# Patient Record
Sex: Female | Born: 1955
Health system: Southern US, Community
[De-identification: ages and names within clinical notes are randomized; demographics above are authoritative.]

## PROBLEM LIST (undated history)

## (undated) DIAGNOSIS — Z801 Family history of malignant neoplasm of trachea, bronchus and lung: Secondary | ICD-10-CM

## (undated) DIAGNOSIS — R112 Nausea with vomiting, unspecified: Secondary | ICD-10-CM

## (undated) DIAGNOSIS — M545 Low back pain, unspecified: Secondary | ICD-10-CM

## (undated) DIAGNOSIS — J189 Pneumonia, unspecified organism: Secondary | ICD-10-CM

## (undated) DIAGNOSIS — M5030 Other cervical disc degeneration, unspecified cervical region: Secondary | ICD-10-CM

## (undated) DIAGNOSIS — L570 Actinic keratosis: Secondary | ICD-10-CM

## (undated) DIAGNOSIS — R55 Syncope and collapse: Secondary | ICD-10-CM

## (undated) DIAGNOSIS — I10 Essential (primary) hypertension: Secondary | ICD-10-CM

## (undated) DIAGNOSIS — C50919 Malignant neoplasm of unspecified site of unspecified female breast: Secondary | ICD-10-CM

## (undated) DIAGNOSIS — Z803 Family history of malignant neoplasm of breast: Secondary | ICD-10-CM

## (undated) DIAGNOSIS — E785 Hyperlipidemia, unspecified: Secondary | ICD-10-CM

## (undated) DIAGNOSIS — Z9889 Other specified postprocedural states: Secondary | ICD-10-CM

## (undated) HISTORY — DX: Essential (primary) hypertension: I10

## (undated) HISTORY — DX: Family history of malignant neoplasm of breast: Z80.3

## (undated) HISTORY — PX: POLYPECTOMY: SHX149

## (undated) HISTORY — DX: Actinic keratosis: L57.0

## (undated) HISTORY — DX: Nausea with vomiting, unspecified: R11.2

## (undated) HISTORY — DX: Family history of malignant neoplasm of trachea, bronchus and lung: Z80.1

## (undated) HISTORY — PX: TUBAL LIGATION: SHX77

## (undated) HISTORY — DX: Hyperlipidemia, unspecified: E78.5

## (undated) HISTORY — DX: Other specified postprocedural states: Z98.890

## (undated) HISTORY — DX: Low back pain: M54.5

## (undated) HISTORY — PX: HAND SURGERY: SHX662

## (undated) HISTORY — DX: Other cervical disc degeneration, unspecified cervical region: M50.30

## (undated) HISTORY — PX: KNEE SURGERY: SHX244

## (undated) HISTORY — PX: OOPHORECTOMY: SHX86

## (undated) HISTORY — DX: Low back pain, unspecified: M54.50

## (undated) HISTORY — DX: Pneumonia, unspecified organism: J18.9

## (undated) HISTORY — PX: ABDOMINAL HYSTERECTOMY: SHX81

## (undated) HISTORY — DX: Syncope and collapse: R55

## (undated) HISTORY — PX: CARDIOVASCULAR STRESS TEST: SHX262

---
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
## 1996-07-29 DIAGNOSIS — J189 Pneumonia, unspecified organism: Secondary | ICD-10-CM

## 1996-07-29 HISTORY — DX: Pneumonia, unspecified organism: J18.9

## 2005-04-29 ENCOUNTER — Ambulatory Visit: Payer: Self-pay | Admitting: Ophthalmology

## 2006-02-27 ENCOUNTER — Ambulatory Visit: Payer: Self-pay | Admitting: Gastroenterology

## 2006-03-10 ENCOUNTER — Ambulatory Visit: Payer: Self-pay

## 2006-03-17 ENCOUNTER — Ambulatory Visit: Payer: Self-pay | Admitting: Gastroenterology

## 2007-07-16 DIAGNOSIS — R55 Syncope and collapse: Secondary | ICD-10-CM

## 2007-07-16 HISTORY — DX: Syncope and collapse: R55

## 2010-04-19 ENCOUNTER — Ambulatory Visit: Payer: Self-pay | Admitting: Internal Medicine

## 2010-06-12 ENCOUNTER — Ambulatory Visit: Payer: Self-pay

## 2010-06-29 ENCOUNTER — Encounter: Payer: Self-pay | Admitting: Family Medicine

## 2010-06-29 ENCOUNTER — Ambulatory Visit: Payer: Self-pay | Admitting: Family Medicine

## 2010-06-29 DIAGNOSIS — I10 Essential (primary) hypertension: Secondary | ICD-10-CM | POA: Insufficient documentation

## 2010-06-29 DIAGNOSIS — D72819 Decreased white blood cell count, unspecified: Secondary | ICD-10-CM | POA: Insufficient documentation

## 2010-06-29 DIAGNOSIS — E785 Hyperlipidemia, unspecified: Secondary | ICD-10-CM | POA: Insufficient documentation

## 2010-06-29 DIAGNOSIS — M5416 Radiculopathy, lumbar region: Secondary | ICD-10-CM | POA: Insufficient documentation

## 2010-07-12 ENCOUNTER — Ambulatory Visit: Payer: Self-pay | Admitting: Family Medicine

## 2010-07-12 ENCOUNTER — Encounter: Payer: Self-pay | Admitting: Family Medicine

## 2010-07-16 LAB — CONVERTED CEMR LAB
ALT: 18 units/L (ref 0–35)
AST: 18 units/L (ref 0–37)
Cholesterol: 239 mg/dL — ABNORMAL HIGH (ref 0–200)
Eosinophils Absolute: 0.1 10*3/uL (ref 0.0–0.7)
Eosinophils Relative: 2 % (ref 0–5)
HDL: 65 mg/dL (ref >39)
Lymphs Abs: 1.5 10*3/uL (ref 0.7–4.0)
MCHC: 32.9 g/dL (ref 30.0–36.0)
Monocytes Relative: 10 % (ref 3–12)
Neutrophils Relative %: 51 % (ref 43–77)
Platelets: 244 10*3/uL (ref 150–400)
WBC: 4.3 10*3/uL (ref 4.0–10.5)

## 2010-08-21 ENCOUNTER — Encounter: Payer: Self-pay | Admitting: Family Medicine

## 2010-08-28 NOTE — Assessment & Plan Note (Signed)
Summary: NEW PT TO EST/CLE  R/S FROM 07/03/10   Vital Signs:  Patient profile:   55 year old female Height:      64.5 inches Weight:      159.50 pounds BMI:     27.05 Temp:     98.3 degrees F oral Pulse rate:   80 / minute Pulse rhythm:   regular BP sitting:   156 / 84  (left arm) Cuff size:   regular  Vitals Entered By: Lewanda Rife LPN (June 29, 2010 3:24 PM) CC: New pt to establish   History of Present Illness: here to est as new pt   used to see Dr Beverely Risen in [place] she lives close by and this is closer   hyperlipidemia- was on lovastatin  had some leg pain with it --off since sept and leg pain has not gone away  chol is high - probably needs to go back on it   still having leg pain   HTN -- has had that for over 30 years  on amlodipne  that usually controls her - avg 130/80  is worried right now and that may be why her bp is high today    asthma - is fairly mild  uses albuterol when she runs -- tends to have cough variant asthma  does get flu shots yearly -- was in oct  2 pneumonia shots since 1998 -- had pneumonia   menopausal symptoms  had hyst in 2005  has been on hormones ever since  does not see gyn  has not disc time to wean off estrogen   has borderline low wbc ct -- last was 3.8 (had been 4.5 several months before )   has had leg pain since early march  slipped on [place]wood with her dog  started in lateral hip L and now moved to her buttock  then one day she bent down and felt like her calf muscles were swollen and tight  briefly in both legs  now is back to the L  then took off chol better  did x rays-- of hip and knee on L  and also venous doppler both legs  which were neg  never had x ray of LS  given pain pill   has known deg disk dz in neck   if she sits for long time - has a lot of pain at bottom of L buttock  cannot squat or sit cross legged  has intermittently had pain in that leg and calf  no numbness or weakness in leg    has been walking a little with her friend -- that feels ok until she starts moving  not running - afraid to because initially it made her worse  took meloxicam -- did not help  then given px for tramadol - has not filled yet  heat does feel good    worried she has gained 20 lb in 2 years   took appetite supressant - helped temporarily       Allergies (verified): 1)  ! Codeine  Past History:  Past Medical History: hyperlipidemia  asthma HTN  deg disk dz in neck   Past Surgical History: hyst supracervical -- L ovary remains  colonosc 07 1998 atypical pneumonia - not hosp  Family History: father deceased lung cancer mother living high chol, HTN, DM uncle lung cancer  MGF CAD aunt with DM  no breast cancer  no breast cancer  some pancreatic cancer in family  Social History: G2P2 married kids live close by  3 grandkids -- really enjoys  works for ins [employer]-- [employer]  exercise -- running when she can  ran a 5 K in march  2011  Review of Systems General:  Denies chills, fatigue, and fever. Eyes:  Denies blurring and eye irritation. CV:  Denies chest pain or discomfort, lightheadness, and palpitations. Resp:  Denies cough, shortness of breath, and wheezing. GI:  Denies abdominal pain, change in bowel habits, indigestion, and nausea. GU:  Denies dysuria and urinary frequency. MS:  Complains of low back pain, muscle aches, and stiffness; denies joint pain, joint redness, joint swelling, cramps, and muscle weakness. Derm:  Denies itching, lesion(s), poor wound healing, and rash. Neuro:  Denies numbness and tingling. Psych:  Denies anxiety and depression. Endo:  Denies cold intolerance, excessive thirst, excessive urination, and heat intolerance. Heme:  Denies abnormal bruising and bleeding.  Physical Exam  General:  Well-developed,well-nourished,in no acute distress; alert,appropriate and cooperative throughout examination Head:  normocephalic,  atraumatic, and no abnormalities observed.   Eyes:  vision grossly intact, pupils equal, pupils round, and pupils reactive to light.  no conjunctival pallor, injection or icterus  Nose:  no nasal discharge.   Mouth:  pharynx pink and moist.   Neck:  supple with full rom and no masses or thyromegally, no JVD or carotid bruit  Chest Wall:  No deformities, masses, or tenderness noted. Lungs:  Normal respiratory effort, chest expands symmetrically. Lungs are clear to auscultation, no crackles or wheezes. Heart:  Normal rate and regular rhythm. S1 and S2 normal without gallop, murmur, click, rub or other extra sounds. Abdomen:  Bowel sounds positive,abdomen soft and non-tender without masses, organomegaly or hernias noted. no renal bruits  Msk:  tender over SI joint and buttock on L  no spinal tenderness nl rom LS  nl rom hips and rotation   Pulses:  R and L carotid,radial,femoral,dorsalis pedis and posterior tibial pulses are full and equal bilaterally Extremities:  No clubbing, cyanosis, edema, or deformity noted with normal full range of motion of all joints.   Neurologic:  strength normal in all extremities, sensation intact to light touch, gait normal, and DTRs symmetrical and normal.   Skin:  Intact without suspicious lesions or rashes Cervical Nodes:  No lymphadenopathy noted Inguinal Nodes:  No significant adenopathy Psych:  pt is somewhat anxious but pleasant and talkative   Impression & Recommendations:  Problem # 1:  BACK PAIN, LUMBAR (ICD-724.2) Assessment New ongoing since injury in march  exam consistent with muscle spasm  trial of flexeril LS films today perhaps consider PT or sport med consult if not clear  Her updated medication list for this problem includes:    Aspirin 81 Mg Tabs (Aspirin) .Marland Kitchen... Take 1 tablet by mouth once a day    Flexeril 10 Mg Tabs (Cyclobenzaprine hcl) .Marland Kitchen... 1 by mouth up to three times a day as needed back/ hip pain  warn- may  sedate  Orders: T-Lumbar Spine 2 Views (72100TC)  Problem # 2:  HYPERTENSION, BENIGN ESSENTIAL (ICD-401.1) Assessment: New  bp is up but per pt is nl on well calibrated cuff at home asked her to bring cuff to next visit and we will continue to monitor  Her updated medication list for this problem includes:    Norvasc 2.5 Mg Tabs (Amlodipine besylate) .Marland Kitchen... 1 by mouth once daily  BP today: 156/84  Orders: Prescription Created Electronically [phone])  Problem # 3:  HYPERLIPIDEMIA (ICD-272.4) Assessment:  New  asked pt to start back on lovastatin and update if any side eff  re check lab 2 wk  Her updated medication list for this problem includes:    Lovastatin 40 Mg Tabs (Lovastatin) .Marland Kitchen... 1 by mouth once daily  Orders: Prescription Created Electronically [phone])  Problem # 4:  LEUKOCYTOPENIA UNSPECIFIED (ICD-288.50) Assessment: New per pt wbc 3.8 last time  she is very worried about this - though re- assured by last doctor  will re check at 2 wk labs no symptoms   Complete Medication List: 1)  Lovastatin 40 Mg Tabs (Lovastatin) .Marland Kitchen.. 1 by mouth once daily 2)  Calcium Carbonate-vitamin D 600-400 Mg-unit Tabs (Calcium carbonate-vitamin d) .... One tablet by mouth twice a day 3)  Aspirin 81 Mg Tabs (Aspirin) .... Take 1 tablet by mouth once a day 4)  Ventolin Hfa 108 (90 Base) Mcg/act Aers (Albuterol sulfate) .... 2 puffs prior to exercise as needed 5)  Estradiol 1 Mg Tabs (Estradiol) .Marland Kitchen.. 1 by mouth once daily 6)  Norvasc 2.5 Mg Tabs (Amlodipine besylate) .Marland Kitchen.. 1 by mouth once daily 7)  Clonazepam 0.5 Mg Tabs (Clonazepam) .Marland Kitchen.. 1 by mouth as needed night sweats 8)  Flexeril 10 Mg Tabs (Cyclobenzaprine hcl) .Marland Kitchen.. 1 by mouth up to three times a day as needed back/ hip pain  warn- may sedate  Patient Instructions: 1)  start back on lovastatin  2)  eat low saturated fat diet (you can raise your HDL (good cholesterol) by increasing exercise and eating omega 3 fatty acid supplement  like fish oil or flax seed oil over the counter 3)  you can lower LDL (bad cholesterol) by limiting saturated fats in diet like red meat, fried foods, egg yolks, fatty breakfast meats, high fat dairy products and shellfish )  4)  x ray today  5)  try flexeril if you want to for symptoms  6)  schedule fasting labs in 2 weeks lipid/ast/alt / cbc with diff for 272 , and leukocytopenia  7)  continue using heat on buttock and leg and back  8)  please send to Dr Park Breed for last 2 sets of labs and last physical, hip and leg x rays and immunization records  9)  please schedule physical in may  Prescriptions: NORVASC 2.5 MG TABS (AMLODIPINE BESYLATE) 1 by mouth once daily  #30 x 11   Entered and Authorized by:   Judith Part MD   Signed by:   Judith Part MD on 06/29/2010   Method used:   [facility]        [facility] [facility])       [address]       [place], [place]  [address]       Ph: [phone] or [phone]       Fax: [phone]   RxID:   [id number] ESTRADIOL 1 MG TABS (ESTRADIOL) 1 by mouth once daily  #30 x 11   Entered and Authorized by:   Judith Part MD   Signed by:   Judith Part MD on 06/29/2010   Method used:   [facility]        [facility] (retail)       [address]       [place], [place]  [address]       Ph: [phone] or [phone]       Fax: [phone]   RxID:   [id number] FLEXERIL 10 MG TABS (CYCLOBENZAPRINE  HCL) 1 by mouth up to three times a day as needed back/ hip pain  warn- may sedate  #30 x 0   Entered and Authorized by:   Judith Part MD   Signed by:   Judith Part MD on 06/29/2010   Method used:   Print then Give to Patient   RxID:   [id number] LOVASTATIN 40 MG TABS (LOVASTATIN) 1 by mouth once daily  #30 x 11   Entered and Authorized by:   Judith Part MD   Signed by:   Judith Part MD on 06/29/2010   Method used:   [facility]        [id number]       [address]       [place], [place]  [address]       Ph: [phone] or [phone]       Fax: [phone]   RxID:   [id number]    Orders Added: 1)  T-Lumbar Spine 2 Views [72100TC] 2)  Prescription Created Electronically [G8553] 3)  New Patient Level IV [32951]   Immunization History:  Influenza Immunization History:    Influenza:  fluvax 3+ (04/19/2010)   Immunization History:  Influenza Immunization History:    Influenza:  Fluvax 3+ (04/19/2010)  Current Allergies (reviewed today): ! CODEINE

## 2010-08-31 NOTE — Letter (Signed)
Summary: Patient Questionnaire  Patient Questionnaire   Imported By: Beau Fanny 07/02/2010 10:05:29  _____________________________________________________________________  External Attachment:    Type:   Image     Comment:   External Document

## 2010-09-06 ENCOUNTER — Encounter: Payer: Self-pay | Admitting: Family Medicine

## 2010-09-11 ENCOUNTER — Encounter (INDEPENDENT_AMBULATORY_CARE_PROVIDER_SITE_OTHER): Payer: Self-pay | Admitting: *Deleted

## 2010-09-11 ENCOUNTER — Other Ambulatory Visit: Payer: Self-pay | Admitting: Family Medicine

## 2010-09-11 ENCOUNTER — Other Ambulatory Visit (INDEPENDENT_AMBULATORY_CARE_PROVIDER_SITE_OTHER): Payer: No Typology Code available for payment source

## 2010-09-11 DIAGNOSIS — E785 Hyperlipidemia, unspecified: Secondary | ICD-10-CM

## 2010-09-11 LAB — ALT: ALT: 21 U/L (ref 0–35)

## 2010-09-11 LAB — LIPID PANEL
Total CHOL/HDL Ratio: 3
Triglycerides: 141 mg/dL (ref 0.0–149.0)

## 2010-09-13 NOTE — Letter (Signed)
Summary: [facility] note  [facility] note   Imported By: Kassie Mends 09/03/2010 10:12:46  _____________________________________________________________________  External Attachment:    Type:   Image     Comment:   External Document

## 2010-09-25 NOTE — Letter (Signed)
Summary: [facility]   [facility]   Imported By: Kassie Mends 09/18/2010 10:16:35  _____________________________________________________________________  External Attachment:    Type:   Image     Comment:   External Document

## 2010-10-06 ENCOUNTER — Encounter: Payer: Self-pay | Admitting: Family Medicine

## 2010-10-23 ENCOUNTER — Encounter: Payer: Self-pay | Admitting: Family Medicine

## 2010-10-24 ENCOUNTER — Encounter: Payer: Self-pay | Admitting: Family Medicine

## 2010-12-05 ENCOUNTER — Encounter: Payer: Self-pay | Admitting: Family Medicine

## 2010-12-05 ENCOUNTER — Ambulatory Visit (INDEPENDENT_AMBULATORY_CARE_PROVIDER_SITE_OTHER): Payer: No Typology Code available for payment source | Admitting: Family Medicine

## 2010-12-05 DIAGNOSIS — Z23 Encounter for immunization: Secondary | ICD-10-CM

## 2010-12-05 DIAGNOSIS — E785 Hyperlipidemia, unspecified: Secondary | ICD-10-CM

## 2010-12-05 DIAGNOSIS — Z1231 Encounter for screening mammogram for malignant neoplasm of breast: Secondary | ICD-10-CM

## 2010-12-05 DIAGNOSIS — I7300 Raynaud's syndrome without gangrene: Secondary | ICD-10-CM

## 2010-12-05 DIAGNOSIS — Z0000 Encounter for general adult medical examination without abnormal findings: Secondary | ICD-10-CM

## 2010-12-05 MED ORDER — ALBUTEROL SULFATE HFA 108 (90 BASE) MCG/ACT IN AERS: 2.0000 | INHALATION_SPRAY | RESPIRATORY_TRACT | Status: DC | PRN

## 2010-12-05 NOTE — Assessment & Plan Note (Signed)
Labs today on mevacor -- if not imp will change to lipitor Rev last lab Rev low sat fat diet

## 2010-12-05 NOTE — Assessment & Plan Note (Signed)
Reviewed health habits including diet and exercise and skin cancer prevention Also reviewed health mt list, fam hx and immunizations  Good habits Wellness  Labs today

## 2010-12-05 NOTE — Assessment & Plan Note (Signed)
Nl breast exam Mam sched Enc self exams monthly

## 2010-12-05 NOTE — Progress Notes (Signed)
Subjective:    Patient ID: [facility], female    DOB: 1956/03/07, 55 y.o.   MRN: [id number]  HPI Here for health mt exam and to rev chronic med problems  Is feeling good in general  Same - no problems   Wt is stable with bmi of 27 Has been having trouble getting exercise  Is eating well  She has exercise equipt at home - no time to use it  Could not go to the gym    Pap- no abn paps and last one was a year , no new partners  supracerv hyst in past   menop symptoms -- some hot flashes at night  On hrt since 2005 -- 7 years  Has to decide when to come off of them  Does get hot flashes  In past took klonopin for sleep      Td - ? Last unsure   Mam 4/11- and it was normal  No lumps on self exam  No breast cancer in her family    colonosc 1/07- no polyps  No colon cancer in family     Pneumovax- has had several of them -- within 5 years    Lipids on mevacor with LDL 155 and HDL 70 Lab Results  Component Value Date   CHOL 240* 09/11/2010   CHOL 239* 07/12/2010   Lab Results  Component Value Date   HDL 70.50 09/11/2010   HDL 65 07/12/2010   Lab Results  Component Value Date   LDLCALC 145* 07/12/2010   Lab Results  Component Value Date   TRIG 141.0 09/11/2010   TRIG 146 07/12/2010   Lab Results  Component Value Date   CHOLHDL 3 09/11/2010   CHOLHDL 3.7 Ratio 07/12/2010   Lab Results  Component Value Date   LDLDIRECT 155.9 09/11/2010   diet-- is very low sat fat  Has not been on other chol meds before   HTn in control with 134/76  Past Medical History  Diagnosis Date  . Hyperlipidemia   . Asthma   . Hypertension   . DDD (degenerative disc disease), cervical   . Atypical pneumonia 1998    not hosp    Past Surgical History  Procedure Date  . Abdominal hysterectomy     supracervical- L ovary remains  . Cardiovascular stress test     08/28/06 normal     History   Social History  . Marital Status: Married    Spouse Name: N/A   Number of Children: N/A  . Years of Education: N/A   Occupational History  . [employer]    Social History Main Topics  . Smoking status: Former Smoker    Quit date: 07/29/1997  . Smokeless tobacco: Not on file  . Alcohol Use: Not on file  . Drug Use: Not on file  . Sexually Active: Not on file   Other Topics Concern  . Not on file   Social History Narrative   Kids live close by, 3 grandkids- really enjoys, runs for exercise, ran a [facility] March 2011    Family History  Problem Relation Age of Onset  . Lung cancer Father   . Hyperlipidemia Mother   . Diabetes Mother   . Hypertension Mother   . Lung cancer      uncle  . Coronary artery disease Maternal Grandfather   . Pancreatic cancer      unspecified family member  . Diabetes      aunt  Review of Systems Review of Systems  Constitutional: Negative for fever, appetite change, fatigue and unexpected weight change.  Eyes: Negative for pain and visual disturbance.  Respiratory: Negative for cough and shortness of breath.   Cardiovascular: Negative for cp or edema  Gastrointestinal: Negative for nausea, diarrhea and constipation.  Genitourinary: Negative for urgency and frequency.  Skin: Negative for pallor.  Neurological: Negative for weakness, light-headedness, numbness and headaches.  Hematological: Negative for adenopathy. Does not bruise/bleed easily.  Psychiatric/Behavioral: Negative for dysphoric mood. The patient is not nervous/anxious.          Objective:   Physical Exam  Constitutional: She appears well-developed and well-nourished.  HENT:  Head: Normocephalic and atraumatic.  Right Ear: External ear normal.  Left Ear: External ear normal.  Nose: Nose normal.  Mouth/Throat: Oropharynx is clear and moist.  Eyes: Conjunctivae and EOM are normal. Pupils are equal, round, and reactive to light.  Neck: Normal range of motion. Neck supple. No JVD present. Carotid bruit is not present. No  thyromegaly present.  Cardiovascular: Normal rate, regular rhythm and normal heart sounds.   No murmur heard. Pulmonary/Chest: Effort normal and breath sounds normal. No respiratory distress. She has no wheezes.  Abdominal: Soft. Bowel sounds are normal. She exhibits no distension, no abdominal bruit and no mass. There is no tenderness.  Genitourinary: No breast swelling, tenderness, discharge or bleeding.  Musculoskeletal: Normal range of motion. She exhibits no edema and no tenderness.  Lymphadenopathy:    She has no cervical adenopathy.  Neurological: She is alert. She has normal reflexes. Coordination normal.  Skin: Skin is warm and dry. No rash noted. No erythema. No pallor.  Psychiatric: She has a normal mood and affect.          Assessment & Plan:

## 2010-12-05 NOTE — Patient Instructions (Addendum)
We will schedule mammogram at check out  Labs today  Tdap vacine today  Keep up the healthy diet and add exercise when you can  We may change to lipitor - lets see how labs look

## 2010-12-06 LAB — COMPREHENSIVE METABOLIC PANEL
ALT: 13 U/L (ref 0–35)
CO2: 28 mEq/L (ref 19–32)
Calcium: 9.3 mg/dL (ref 8.4–10.5)
Chloride: 105 mEq/L (ref 96–112)
GFR: 84.62 mL/min (ref >60.00)
Sodium: 141 mEq/L (ref 135–145)
Total Protein: 7.1 g/dL (ref 6.0–8.3)

## 2010-12-06 LAB — CBC WITH DIFFERENTIAL/PLATELET
Basophils Absolute: 0 10*3/uL (ref 0.0–0.1)
Hemoglobin: 12.7 g/dL (ref 12.0–15.0)
Lymphocytes Relative: 33.8 % (ref 12.0–46.0)
Monocytes Relative: 9.6 % (ref 3.0–12.0)
Platelets: 222 10*3/uL (ref 150.0–400.0)
RDW: 13.6 % (ref 11.5–14.6)

## 2010-12-06 LAB — LDL CHOLESTEROL, DIRECT: Direct LDL: 124.9 mg/dL

## 2010-12-06 LAB — LIPID PANEL: HDL: 62.8 mg/dL (ref >39.00)

## 2010-12-13 ENCOUNTER — Telehealth: Payer: Self-pay

## 2010-12-13 NOTE — Telephone Encounter (Signed)
Patient notified as instructed by telephone. Pt is not in her home where she can write down appt so pt will call back to schedule fasting lab work.

## 2010-12-13 NOTE — Telephone Encounter (Signed)
Message copied by Lewanda Rife on Thu Dec 13, 2010  5:42 PM ------      Message from: Roxy Manns      Created: Thu Dec 06, 2010  6:45 PM       Labs are ok and cholesterol is improved       LDL in 120s and goal is under 130      Can stay with the mevacor for now      Avoid red meat/ fried foods/ egg yolks/ fatty breakfast meats/ butter, cheese and high fat dairy/ and shellfish        Recheck fasting lipid/ast/alt 272 in 6 mo please if not already scheduled

## 2011-01-08 ENCOUNTER — Ambulatory Visit: Payer: Self-pay | Admitting: Family Medicine

## 2011-01-08 LAB — HM MAMMOGRAPHY: HM Mammogram: NEGATIVE

## 2011-03-14 ENCOUNTER — Ambulatory Visit: Payer: Self-pay | Admitting: Rheumatology

## 2011-04-09 ENCOUNTER — Telehealth: Payer: Self-pay | Admitting: *Deleted

## 2011-04-09 NOTE — Telephone Encounter (Signed)
Pharmacy ([facility]) substitution for estradiol 1 mg. The medication is not available.

## 2011-04-09 NOTE — Telephone Encounter (Signed)
Please ask pharmacist what they have that is equivalent and I will px -thanks

## 2011-04-09 NOTE — Telephone Encounter (Signed)
Spoke with pharmacist at [facility] and she said she could give pt Estradiol 2 mg with instructions to take 1/2 tab daily. Dr Milinda Antis said that would be fine.

## 2011-05-29 ENCOUNTER — Other Ambulatory Visit: Payer: Self-pay | Admitting: Family Medicine

## 2011-05-30 ENCOUNTER — Other Ambulatory Visit: Payer: No Typology Code available for payment source

## 2011-05-30 ENCOUNTER — Telehealth: Payer: Self-pay | Admitting: Family Medicine

## 2011-05-30 ENCOUNTER — Ambulatory Visit (INDEPENDENT_AMBULATORY_CARE_PROVIDER_SITE_OTHER): Payer: No Typology Code available for payment source

## 2011-05-30 DIAGNOSIS — Z23 Encounter for immunization: Secondary | ICD-10-CM

## 2011-05-30 DIAGNOSIS — D72819 Decreased white blood cell count, unspecified: Secondary | ICD-10-CM

## 2011-05-30 DIAGNOSIS — E785 Hyperlipidemia, unspecified: Secondary | ICD-10-CM

## 2011-05-30 DIAGNOSIS — Z0000 Encounter for general adult medical examination without abnormal findings: Secondary | ICD-10-CM

## 2011-05-30 DIAGNOSIS — I10 Essential (primary) hypertension: Secondary | ICD-10-CM

## 2011-05-30 NOTE — Telephone Encounter (Signed)
Message copied by Judy Pimple on Thu May 30, 2011  8:53 AM ------      Message from: Eustaquio Boyden      Created: Wed May 29, 2011  2:59 PM       Will route to PCP      ----- Message -----         From: Mills Koller         Sent: 05/29/2011   2:18 PM           To: Eustaquio Boyden, MD            Labs for Thursday, T

## 2011-05-31 ENCOUNTER — Other Ambulatory Visit (INDEPENDENT_AMBULATORY_CARE_PROVIDER_SITE_OTHER): Payer: No Typology Code available for payment source

## 2011-05-31 ENCOUNTER — Other Ambulatory Visit: Payer: No Typology Code available for payment source

## 2011-05-31 DIAGNOSIS — E785 Hyperlipidemia, unspecified: Secondary | ICD-10-CM

## 2011-05-31 DIAGNOSIS — Z0000 Encounter for general adult medical examination without abnormal findings: Secondary | ICD-10-CM

## 2011-05-31 DIAGNOSIS — I10 Essential (primary) hypertension: Secondary | ICD-10-CM

## 2011-05-31 DIAGNOSIS — D72819 Decreased white blood cell count, unspecified: Secondary | ICD-10-CM

## 2011-05-31 LAB — COMPREHENSIVE METABOLIC PANEL WITH GFR
ALT: 18 U/L (ref 0–35)
AST: 19 U/L (ref 0–37)
Albumin: 4.3 g/dL (ref 3.5–5.2)
Alkaline Phosphatase: 48 U/L (ref 39–117)
BUN: 15 mg/dL (ref 6–23)
CO2: 27 meq/L (ref 19–32)
Calcium: 9.2 mg/dL (ref 8.4–10.5)
Chloride: 104 meq/L (ref 96–112)
Creatinine, Ser: 0.8 mg/dL (ref 0.4–1.2)
GFR: 78.41 mL/min
Glucose, Bld: 93 mg/dL (ref 70–99)
Potassium: 4.2 meq/L (ref 3.5–5.1)
Sodium: 138 meq/L (ref 135–145)
Total Bilirubin: 0.6 mg/dL (ref 0.3–1.2)
Total Protein: 7.7 g/dL (ref 6.0–8.3)

## 2011-05-31 LAB — CBC WITH DIFFERENTIAL/PLATELET
Basophils Absolute: 0.1 K/uL (ref 0.0–0.1)
Basophils Relative: 1 % (ref 0.0–3.0)
Eosinophils Absolute: 0.1 K/uL (ref 0.0–0.7)
Eosinophils Relative: 1 % (ref 0.0–5.0)
HCT: 38.2 % (ref 36.0–46.0)
Hemoglobin: 12.9 g/dL (ref 12.0–15.0)
Lymphocytes Relative: 30.1 % (ref 12.0–46.0)
Lymphs Abs: 1.6 K/uL (ref 0.7–4.0)
MCHC: 33.7 g/dL (ref 30.0–36.0)
MCV: 89 fl (ref 78.0–100.0)
Monocytes Absolute: 0.5 K/uL (ref 0.1–1.0)
Monocytes Relative: 9.7 % (ref 3.0–12.0)
Neutro Abs: 3 K/uL (ref 1.4–7.7)
Neutrophils Relative %: 58.2 % (ref 43.0–77.0)
Platelets: 232 K/uL (ref 150.0–400.0)
RBC: 4.29 Mil/uL (ref 3.87–5.11)
RDW: 13.6 % (ref 11.5–14.6)
WBC: 5.2 K/uL (ref 4.5–10.5)

## 2011-05-31 LAB — LDL CHOLESTEROL, DIRECT: Direct LDL: 128.7 mg/dL

## 2011-05-31 LAB — TSH: TSH: 1.8 u[IU]/mL (ref 0.35–5.50)

## 2011-05-31 LAB — LIPID PANEL: Total CHOL/HDL Ratio: 3

## 2011-06-04 ENCOUNTER — Other Ambulatory Visit: Payer: No Typology Code available for payment source

## 2011-06-13 ENCOUNTER — Ambulatory Visit: Payer: No Typology Code available for payment source

## 2011-07-26 ENCOUNTER — Other Ambulatory Visit: Payer: Self-pay | Admitting: *Deleted

## 2011-07-26 MED ORDER — LOVASTATIN 40 MG PO TABS: 40.0000 mg | ORAL_TABLET | Freq: Every day | ORAL | Status: DC

## 2011-07-26 MED ORDER — AMLODIPINE BESYLATE 2.5 MG PO TABS: 2.5000 mg | ORAL_TABLET | Freq: Every day | ORAL | Status: DC

## 2011-07-26 NOTE — Telephone Encounter (Signed)
I think she is still on it  Will refill electronically

## 2011-07-26 NOTE — Telephone Encounter (Signed)
Received faxed fill for Lovastatin, medication was taken off med list 11/2010.  Please advise.

## 2011-09-20 ENCOUNTER — Telehealth: Payer: Self-pay | Admitting: Family Medicine

## 2011-09-20 NOTE — Telephone Encounter (Signed)
Can try black [facility] Ill- is an herb - can be in different products- ask a phamacist -it does not work for everyone but worth a try

## 2011-09-20 NOTE — Telephone Encounter (Signed)
Patient notified as instructed by telephone. 

## 2011-09-20 NOTE — Telephone Encounter (Signed)
Requesting suggestions for over the counter supplement for hot flashes. Call patient back at [phone]

## 2012-02-02 ENCOUNTER — Telehealth: Payer: Self-pay | Admitting: Family Medicine

## 2012-02-02 DIAGNOSIS — E785 Hyperlipidemia, unspecified: Secondary | ICD-10-CM

## 2012-02-02 DIAGNOSIS — Z0000 Encounter for general adult medical examination without abnormal findings: Secondary | ICD-10-CM

## 2012-02-02 NOTE — Telephone Encounter (Signed)
Message copied by Judy Pimple on Sun Feb 02, 2012 12:18 PM ------      Message from: Baldomero Lamy      Created: Mon Jan 27, 2012  1:45 PM      Regarding: Cpx labs Mon 7/8       Please order  future cpx labs for pt's upcomming lab appt.      Thanks      Rodney Booze

## 2012-02-03 ENCOUNTER — Other Ambulatory Visit (INDEPENDENT_AMBULATORY_CARE_PROVIDER_SITE_OTHER): Payer: 59

## 2012-02-03 DIAGNOSIS — Z0000 Encounter for general adult medical examination without abnormal findings: Secondary | ICD-10-CM

## 2012-02-03 DIAGNOSIS — E785 Hyperlipidemia, unspecified: Secondary | ICD-10-CM

## 2012-02-03 LAB — CBC WITH DIFFERENTIAL/PLATELET
Basophils Absolute: 0 10*3/uL (ref 0.0–0.1)
Eosinophils Relative: 1 % (ref 0.0–5.0)
HCT: 39.1 % (ref 36.0–46.0)
Lymphs Abs: 1.6 10*3/uL (ref 0.7–4.0)
Monocytes Relative: 9.7 % (ref 3.0–12.0)
Neutrophils Relative %: 57.5 % (ref 43.0–77.0)
Platelets: 218 10*3/uL (ref 150.0–400.0)
RDW: 13.8 % (ref 11.5–14.6)
WBC: 5.3 10*3/uL (ref 4.5–10.5)

## 2012-02-03 LAB — COMPREHENSIVE METABOLIC PANEL
ALT: 16 U/L (ref 0–35)
Albumin: 4.3 g/dL (ref 3.5–5.2)
Alkaline Phosphatase: 47 U/L (ref 39–117)
CO2: 27 mEq/L (ref 19–32)
GFR: 77.11 mL/min (ref >60.00)
Glucose, Bld: 104 mg/dL — ABNORMAL HIGH (ref 70–99)
Potassium: 4.5 mEq/L (ref 3.5–5.1)
Sodium: 138 mEq/L (ref 135–145)
Total Bilirubin: 0.8 mg/dL (ref 0.3–1.2)
Total Protein: 7.8 g/dL (ref 6.0–8.3)

## 2012-02-03 LAB — TSH: TSH: 1.63 u[IU]/mL (ref 0.35–5.50)

## 2012-02-03 LAB — LIPID PANEL
HDL: 63.5 mg/dL (ref >39.00)
Triglycerides: 163 mg/dL — ABNORMAL HIGH (ref 0.0–149.0)

## 2012-02-05 ENCOUNTER — Ambulatory Visit (INDEPENDENT_AMBULATORY_CARE_PROVIDER_SITE_OTHER): Payer: 59 | Admitting: Family Medicine

## 2012-02-05 ENCOUNTER — Encounter: Payer: Self-pay | Admitting: Family Medicine

## 2012-02-05 VITALS — BP 130/80 | HR 72 | Temp 98.3°F | Ht 64.0 in | Wt 158.2 lb

## 2012-02-05 DIAGNOSIS — Z0000 Encounter for general adult medical examination without abnormal findings: Secondary | ICD-10-CM

## 2012-02-05 DIAGNOSIS — E785 Hyperlipidemia, unspecified: Secondary | ICD-10-CM

## 2012-02-05 DIAGNOSIS — I10 Essential (primary) hypertension: Secondary | ICD-10-CM

## 2012-02-05 DIAGNOSIS — Z1231 Encounter for screening mammogram for malignant neoplasm of breast: Secondary | ICD-10-CM

## 2012-02-05 MED ORDER — ALBUTEROL SULFATE HFA 108 (90 BASE) MCG/ACT IN AERS: 2.0000 | INHALATION_SPRAY | RESPIRATORY_TRACT | Status: DC | PRN

## 2012-02-05 NOTE — Patient Instructions (Addendum)
We will schedule mammogram at check out  Schedule fasting lab in 2-3 months for cholesterol (Avoid red meat/ fried foods/ egg yolks/ fatty breakfast meats/ butter, cheese and high fat dairy/ and shellfish  )  Think about exercise program Use sun block If spot on your Left arm is not gone in 2-4 weeks - see your dermatologist

## 2012-02-05 NOTE — Assessment & Plan Note (Signed)
Chol up because she missed med for 1 wk on vacation and ate poorly Will continue that and good diet / mevacor Re check lab in 2 mo-exp imp

## 2012-02-05 NOTE — Assessment & Plan Note (Signed)
Reviewed health habits including diet and exercise and skin cancer prevention Also reviewed health mt list, fam hx and immunizations  Rev labs in detail Scab on L arm looks healing -but if not further improved in 2 weeks I adv her to see her dermatologist to rule out any skin cancer (pale area)

## 2012-02-05 NOTE — Assessment & Plan Note (Signed)
bp in fair control at this time  No changes needed  Disc lifstyle change with low sodium diet and exercise   Reviewed lab today

## 2012-02-05 NOTE — Assessment & Plan Note (Signed)
Scheduled annual screening mammogram Nl breast exam today  Encouraged monthly self exams   

## 2012-02-05 NOTE — Progress Notes (Signed)
Subjective:    Patient ID: Brittney Johnson, female    DOB: January 11, 1956, 56 y.o.   MRN: [id number]  HPI Here for health maintenance exam and to review chronic medical problems    Is having a good summer  Is feeling good  Nothing new going on   Weaned herself off the HRT  Having night sweats and hot flashes -- poss getting a bit better   Wt is stable with bmi of 27  bp is    115/68 Today- will re check, that is low for her  No cp or palpitations or headaches or edema  No side effects to medicines    mammo 6/12- needs to schedule that - [place] Self exam --no lumps or changes   Pap- ? Was before she came here 2 years ago  Not due for one more year  No new partners  Hx of supracervical hyst in past   colonosc 07-with 10 year recall   utd imms   Labs    Chemistry      Component Value Date/Time   NA 138 02/03/2012 0908   K 4.5 02/03/2012 0908   CL 103 02/03/2012 0908   CO2 27 02/03/2012 0908   BUN 16 02/03/2012 0908   CREATININE 0.8 02/03/2012 0908      Component Value Date/Time   CALCIUM 9.6 02/03/2012 0908   ALKPHOS 47 02/03/2012 0908   AST 21 02/03/2012 0908   ALT 16 02/03/2012 0908   BILITOT 0.8 02/03/2012 0908     Lab Results  Component Value Date   WBC 5.3 02/03/2012   HGB 12.9 02/03/2012   HCT 39.1 02/03/2012   MCV 88.7 02/03/2012   PLT 218.0 02/03/2012    Lab Results  Component Value Date   TSH 1.63 02/03/2012   Lab Results  Component Value Date   CHOL 248* 02/03/2012   CHOL 206* 05/31/2011   CHOL 208* 12/05/2010   Lab Results  Component Value Date   HDL 63.50 02/03/2012   HDL 62.80 05/31/2011   HDL 11.91 12/05/2010   Lab Results  Component Value Date   LDLCALC 145* 07/12/2010   Lab Results  Component Value Date   TRIG 163.0* 02/03/2012   TRIG 118.0 05/31/2011   TRIG 143.0 12/05/2010   Lab Results  Component Value Date   CHOLHDL 4 02/03/2012   CHOLHDL 3 05/31/2011   CHOLHDL 3 12/05/2010   Lab Results  Component Value Date   LDLDIRECT 152.1 02/03/2012   LDLDIRECT 128.7  05/31/2011   LDLDIRECT 124.9 12/05/2010   is onn mevacor at this time and fish oil Had just come back from vacation- was eating poorly  Also forgot her medication on vacation also -- not surprised it was up  Sugar was 104 Has addiction to sweets - has to get a handle on it  Is doing the couch to 5 K - likes to run  Getting stuck there but not too difficult - goal is to run a 5 K in oct   Patient Active Problem List  Diagnosis  . HYPERLIPIDEMIA  . LEUKOCYTOPENIA UNSPECIFIED  . HYPERTENSION, BENIGN ESSENTIAL  . BACK PAIN, LUMBAR  . Routine general medical examination at a health care facility  . Other screening mammogram  . Raynaud disease   Past Medical History  Diagnosis Date  . Hyperlipidemia   . Asthma   . Hypertension   . DDD (degenerative disc disease), cervical   . Atypical pneumonia 1998    not hosp  Past Surgical History  Procedure Date  . Abdominal hysterectomy     supracervical- L ovary remains  . Cardiovascular stress test     08/28/06 normal    History  Substance Use Topics  . Smoking status: Former Smoker    Quit date: 07/29/1997  . Smokeless tobacco: Not on file  . Alcohol Use: Yes     occassionally   Family History  Problem Relation Age of Onset  . Lung cancer Father   . Hyperlipidemia Mother   . Diabetes Mother   . Hypertension Mother   . Lung cancer      uncle  . Coronary artery disease Maternal Grandfather   . Pancreatic cancer      unspecified family member  . Diabetes      aunt   Allergies  Allergen Reactions  . Codeine     REACTION: passed out   Current Outpatient Prescriptions on File Prior to Visit  Medication Sig Dispense Refill  . albuterol (VENTOLIN HFA) 108 (90 BASE) MCG/ACT inhaler Inhale 2 puffs into the lungs every 4 (four) hours as needed for wheezing. 2 puffs prior to exercise as needed.  1 Inhaler  3  . amLODipine (NORVASC) 2.5 MG tablet Take 1 tablet (2.5 mg total) by mouth daily.  30 tablet  6  . aspirin 81 MG chewable  tablet Chew 81 mg by mouth daily.        . Calcium Carbonate-Vit D-Min 600-400 MG-UNIT TABS Take 1 tablet by mouth daily.       Marland Kitchen lovastatin (MEVACOR) 40 MG tablet Take 1 tablet (40 mg total) by mouth daily.  30 tablet  11  . Omega-3 Fatty Acids (FISH OIL) 1000 MG CAPS Take 1 capsule by mouth 2 (two) times daily.        . clonazePAM (KLONOPIN) 0.5 MG tablet One by mouth as needed night sweats         Review of Systems Review of Systems  Constitutional: Negative for fever, appetite change, fatigue and unexpected weight change.  Eyes: Negative for pain and visual disturbance.  Respiratory: Negative for cough and shortness of breath.   Cardiovascular: Negative for cp or palpitations    Gastrointestinal: Negative for nausea, diarrhea and constipation.  Genitourinary: Negative for urgency and frequency.  Skin: Negative for pallor or rash   Neurological: Negative for weakness, light-headedness, numbness and headaches.  Hematological: Negative for adenopathy. Does not bruise/bleed easily.  Psychiatric/Behavioral: Negative for dysphoric mood. The patient is not nervous/anxious.         Objective:   Physical Exam  Constitutional: She appears well-developed and well-nourished. No distress.  HENT:  Head: Normocephalic and atraumatic.  Right Ear: External ear normal.  Left Ear: External ear normal.  Mouth/Throat: Oropharynx is clear and moist.  Eyes: Conjunctivae and EOM are normal. Pupils are equal, round, and reactive to light. No scleral icterus.  Neck: Normal range of motion. Neck supple. No JVD present. Carotid bruit is not present. Erythema present. No thyromegaly present.  Cardiovascular: Normal rate, regular rhythm, normal heart sounds and intact distal pulses.  Exam reveals no gallop.   Pulmonary/Chest: Effort normal and breath sounds normal. No respiratory distress. She has no wheezes.  Abdominal: Soft. Bowel sounds are normal. She exhibits no distension, no abdominal bruit and no  mass. There is no tenderness.  Genitourinary: No breast swelling, tenderness, discharge or bleeding.  Musculoskeletal: She exhibits no edema and no tenderness.  Lymphadenopathy:    She has no cervical adenopathy.  Neurological: She is alert. She has normal reflexes. No cranial nerve deficit. She exhibits normal muscle tone. Coordination normal.  Skin: Skin is warm and dry. No rash noted. No erythema. No pallor.  Psychiatric: She has a normal mood and affect.          Assessment & Plan:

## 2012-02-28 ENCOUNTER — Ambulatory Visit: Payer: Self-pay | Admitting: Family Medicine

## 2012-03-02 ENCOUNTER — Encounter: Payer: Self-pay | Admitting: Family Medicine

## 2012-03-02 LAB — HM MAMMOGRAPHY: HM Mammogram: NORMAL

## 2012-03-03 ENCOUNTER — Encounter: Payer: Self-pay | Admitting: Family Medicine

## 2012-03-03 ENCOUNTER — Encounter: Payer: Self-pay | Admitting: *Deleted

## 2012-04-08 ENCOUNTER — Other Ambulatory Visit: Payer: Self-pay | Admitting: Family Medicine

## 2012-04-08 NOTE — Telephone Encounter (Signed)
Amlodoipine 2.5 mg #30 11R electronic sent to [facility].

## 2012-04-23 ENCOUNTER — Ambulatory Visit: Payer: 59

## 2012-04-28 ENCOUNTER — Encounter: Payer: Self-pay | Admitting: Family Medicine

## 2012-04-28 ENCOUNTER — Ambulatory Visit (INDEPENDENT_AMBULATORY_CARE_PROVIDER_SITE_OTHER): Payer: Managed Care, Other (non HMO) | Admitting: Family Medicine

## 2012-04-28 VITALS — BP 158/94 | HR 80 | Temp 97.7°F | Ht 63.75 in | Wt 157.2 lb

## 2012-04-28 DIAGNOSIS — Z23 Encounter for immunization: Secondary | ICD-10-CM

## 2012-04-28 DIAGNOSIS — R1031 Right lower quadrant pain: Secondary | ICD-10-CM | POA: Insufficient documentation

## 2012-04-28 LAB — CBC WITH DIFFERENTIAL/PLATELET
Basophils Absolute: 0 10*3/uL (ref 0.0–0.1)
Hemoglobin: 12.4 g/dL (ref 12.0–15.0)
Lymphocytes Relative: 28.8 % (ref 12.0–46.0)
Monocytes Relative: 8.6 % (ref 3.0–12.0)
Neutrophils Relative %: 60.9 % (ref 43.0–77.0)
Platelets: 224 10*3/uL (ref 150.0–400.0)
RDW: 13.6 % (ref 11.5–14.6)

## 2012-04-28 LAB — POCT UA - MICROSCOPIC ONLY
Casts, Ur, LPF, POC: 0
Crystals, Ur, HPF, POC: 0

## 2012-04-28 LAB — POCT URINALYSIS DIPSTICK
Bilirubin, UA: NEGATIVE
Nitrite, UA: NEGATIVE
Protein, UA: NEGATIVE
Urobilinogen, UA: 0.2
pH, UA: 5

## 2012-04-28 LAB — COMPREHENSIVE METABOLIC PANEL
ALT: 19 U/L (ref 0–35)
AST: 20 U/L (ref 0–37)
CO2: 27 mEq/L (ref 19–32)
Calcium: 9.8 mg/dL (ref 8.4–10.5)
Chloride: 104 mEq/L (ref 96–112)
GFR: 84.2 mL/min (ref >60.00)
Sodium: 139 mEq/L (ref 135–145)
Total Protein: 7.6 g/dL (ref 6.0–8.3)

## 2012-04-28 NOTE — Assessment & Plan Note (Signed)
No signs of acute abdomen Neg ua  No fever  No ovary on that side No appreciable hernia on exam Will check labs and CT abd and pelvis Pt aware to call or seek care at any time if worse

## 2012-04-28 NOTE — Patient Instructions (Addendum)
Lab today  We will refer you for CT scan at check out If worse call (go to the ER if severe) Also alert me if fever or other symptoms  Flu shot today

## 2012-04-28 NOTE — Progress Notes (Signed)
Subjective:    Patient ID: Brittney Johnson, female    DOB: Jun 20, 1956, 56 y.o.   MRN: [id number]  HPI Sunday night woke up with pain R low abdomen and pelvic - bad enough to wake her up  Is persistant- no better and no worse  Feels deep and dull ache  A cough or twist may make it a little worse Feels fine  No fever   Feels like ovarian cyst - but that ovary was removed Total hyst except for L ovary  No vag d/c and no chance of std   No n/v or stool changes   bp is up   No otc pain med   Patient Active Problem List  Diagnosis  . HYPERLIPIDEMIA  . LEUKOCYTOPENIA UNSPECIFIED  . HYPERTENSION, BENIGN ESSENTIAL  . BACK PAIN, LUMBAR  . Routine general medical examination at a health care facility  . Other screening mammogram  . Raynaud disease   Past Medical History  Diagnosis Date  . Hyperlipidemia   . Asthma   . Hypertension   . DDD (degenerative disc disease), cervical   . Atypical pneumonia 1998    not hosp   Past Surgical History  Procedure Date  . Abdominal hysterectomy     supracervical- L ovary remains  . Cardiovascular stress test     08/28/06 normal    History  Substance Use Topics  . Smoking status: Former Smoker    Quit date: 07/29/1997  . Smokeless tobacco: Not on file  . Alcohol Use: Yes     occassionally   Family History  Problem Relation Age of Onset  . Lung cancer Father   . Hyperlipidemia Mother   . Diabetes Mother   . Hypertension Mother   . Lung cancer      uncle  . Coronary artery disease Maternal Grandfather   . Pancreatic cancer      unspecified family member  . Diabetes      aunt   Allergies  Allergen Reactions  . Codeine     REACTION: passed out   Current Outpatient Prescriptions on File Prior to Visit  Medication Sig Dispense Refill  . albuterol (VENTOLIN HFA) 108 (90 BASE) MCG/ACT inhaler Inhale 2 puffs into the lungs every 4 (four) hours as needed for wheezing. 2 puffs prior to exercise as needed.  1 Inhaler  3  .  amLODipine (NORVASC) 2.5 MG tablet TAKE 1 TABLET BY MOUTH DAILY.  30 tablet  11  . aspirin 81 MG chewable tablet Chew 81 mg by mouth daily.        . Calcium Carbonate-Vit D-Min 600-400 MG-UNIT TABS Take 1 tablet by mouth daily.       Marland Kitchen lovastatin (MEVACOR) 40 MG tablet Take 1 tablet (40 mg total) by mouth daily.  30 tablet  11  . Omega-3 Fatty Acids (FISH OIL) 1000 MG CAPS Take 1 capsule by mouth 2 (two) times daily.            Review of Systems Review of Systems  Constitutional: Negative for fever, appetite change, fatigue and unexpected weight change.  Eyes: Negative for pain and visual disturbance.  Respiratory: Negative for cough and shortness of breath.   Cardiovascular: Negative for cp or palpitations    Gastrointestinal: Negative for nausea, diarrhea and constipation. pos for abd/ pelvic pain  Genitourinary: Negative for urgency and frequency. neg for blood in urine/ back or flank pain  Skin: Negative for pallor or rash   Neurological: Negative for weakness, light-headedness,  numbness and headaches.  Hematological: Negative for adenopathy. Does not bruise/bleed easily.  Psychiatric/Behavioral: Negative for dysphoric mood. The patient is not nervous/anxious.         Objective:   Physical Exam  Constitutional: She appears well-developed and well-nourished. No distress.  HENT:  Head: Normocephalic and atraumatic.  Mouth/Throat: Oropharynx is clear and moist.  Eyes: Conjunctivae normal and EOM are normal. Pupils are equal, round, and reactive to light. Right eye exhibits no discharge. Left eye exhibits no discharge. No scleral icterus.  Neck: Normal range of motion. Neck supple. No JVD present. Carotid bruit is not present. No thyromegaly present.  Cardiovascular: Normal rate, regular rhythm, normal heart sounds and intact distal pulses.  Exam reveals no gallop.   Pulmonary/Chest: Effort normal and breath sounds normal. No respiratory distress. She has no wheezes.  Abdominal:  Soft. Bowel sounds are normal. She exhibits no distension, no abdominal bruit, no ascites and no mass. There is no hepatosplenomegaly. There is tenderness in the right lower quadrant. There is no rigidity, no rebound, no guarding and no CVA tenderness.       RLQ abd tenderness without rebound or guarding No rigidity or mass noted  Nl BS  No pain on movement or shaking table  No pain on full ROM of hip No rash or skin changes noted  No hernia detected   Musculoskeletal: She exhibits no edema and no tenderness.  Lymphadenopathy:    She has no cervical adenopathy.  Neurological: She is alert. She has normal reflexes.  Skin: Skin is warm and dry. No rash noted. No erythema. No pallor.  Psychiatric: She has a normal mood and affect.          Assessment & Plan:

## 2012-04-29 ENCOUNTER — Encounter: Payer: Self-pay | Admitting: *Deleted

## 2012-04-29 ENCOUNTER — Telehealth: Payer: Self-pay | Admitting: Family Medicine

## 2012-04-29 NOTE — Telephone Encounter (Signed)
Thanks for the update , will re consider if symptoms come back Thanks for your effort

## 2012-04-29 NOTE — Telephone Encounter (Signed)
Patient called to say that the pain she was having startred going away last night and today it is conpletely gone. She wants to cancel the CT Scan at [facility] as she is not having any pain now. Told her her lab results also because she asked about them. I will call [facility] ans cancel the appt. Told her to call you back if the pain comes back. Also told her to log into My chart.

## 2012-05-07 ENCOUNTER — Other Ambulatory Visit: Payer: 59

## 2012-05-14 ENCOUNTER — Other Ambulatory Visit (INDEPENDENT_AMBULATORY_CARE_PROVIDER_SITE_OTHER): Payer: Managed Care, Other (non HMO)

## 2012-05-14 DIAGNOSIS — E785 Hyperlipidemia, unspecified: Secondary | ICD-10-CM

## 2012-05-14 LAB — AST: AST: 18 U/L (ref 0–37)

## 2012-05-14 LAB — LIPID PANEL
Cholesterol: 186 mg/dL (ref 0–200)
LDL Cholesterol: 106 mg/dL — ABNORMAL HIGH (ref 0–99)
Total CHOL/HDL Ratio: 3
Triglycerides: 115 mg/dL (ref 0.0–149.0)

## 2012-05-15 ENCOUNTER — Encounter: Payer: Self-pay | Admitting: *Deleted

## 2012-07-31 ENCOUNTER — Other Ambulatory Visit: Payer: Self-pay | Admitting: Family Medicine

## 2012-07-31 MED ORDER — LOVASTATIN 40 MG PO TABS: 40.0000 mg | ORAL_TABLET | Freq: Every day | ORAL | Status: DC

## 2013-03-01 ENCOUNTER — Telehealth: Payer: Self-pay | Admitting: Family Medicine

## 2013-03-01 DIAGNOSIS — Z0000 Encounter for general adult medical examination without abnormal findings: Secondary | ICD-10-CM

## 2013-03-01 DIAGNOSIS — E785 Hyperlipidemia, unspecified: Secondary | ICD-10-CM

## 2013-03-01 NOTE — Telephone Encounter (Signed)
Message copied by Judy Pimple on Mon Mar 01, 2013 10:04 PM ------      Message from: Alvina Chou      Created: Tue Feb 16, 2013 11:38 AM      Regarding: Lab orders for Tuesday, 8.5.14       Patient is scheduled for CPX labs, please order future labs, Thanks , Terri       ------

## 2013-03-02 ENCOUNTER — Telehealth: Payer: Self-pay | Admitting: *Deleted

## 2013-03-02 ENCOUNTER — Other Ambulatory Visit (INDEPENDENT_AMBULATORY_CARE_PROVIDER_SITE_OTHER): Payer: 59

## 2013-03-02 DIAGNOSIS — I7300 Raynaud's syndrome without gangrene: Secondary | ICD-10-CM

## 2013-03-02 DIAGNOSIS — I10 Essential (primary) hypertension: Secondary | ICD-10-CM

## 2013-03-02 DIAGNOSIS — E785 Hyperlipidemia, unspecified: Secondary | ICD-10-CM

## 2013-03-02 DIAGNOSIS — D72819 Decreased white blood cell count, unspecified: Secondary | ICD-10-CM

## 2013-03-02 DIAGNOSIS — Z0000 Encounter for general adult medical examination without abnormal findings: Secondary | ICD-10-CM

## 2013-03-02 LAB — CBC WITH DIFFERENTIAL/PLATELET
Eosinophils Relative: 1.5 % (ref 0.0–5.0)
Lymphocytes Relative: 38.5 % (ref 12.0–46.0)
Monocytes Absolute: 0.5 10*3/uL (ref 0.1–1.0)
Monocytes Relative: 10 % (ref 3.0–12.0)
Neutrophils Relative %: 48.9 % (ref 43.0–77.0)
Platelets: 234 10*3/uL (ref 150.0–400.0)
WBC: 5 10*3/uL (ref 4.5–10.5)

## 2013-03-02 LAB — LIPID PANEL
Cholesterol: 244 mg/dL — ABNORMAL HIGH (ref 0–200)
Total CHOL/HDL Ratio: 4

## 2013-03-02 LAB — COMPREHENSIVE METABOLIC PANEL
Albumin: 4.3 g/dL (ref 3.5–5.2)
Alkaline Phosphatase: 52 U/L (ref 39–117)
CO2: 28 mEq/L (ref 19–32)
Chloride: 105 mEq/L (ref 96–112)
Glucose, Bld: 93 mg/dL (ref 70–99)
Potassium: 3.8 mEq/L (ref 3.5–5.1)
Sodium: 139 mEq/L (ref 135–145)
Total Protein: 7.5 g/dL (ref 6.0–8.3)

## 2013-03-02 NOTE — Telephone Encounter (Signed)
Pt wants to know if you want to add an ANA to her labs today. She says it was abnormal a year ago and she was referred to Dr Gavin Potters. She saw him initally and again for a f/u, but nothing else was said about it. Does she need repeated? I drew for it in case you wanted to add it.

## 2013-03-02 NOTE — Addendum Note (Signed)
Addended by: Baldomero Lamy on: 03/02/2013 02:13 PM   Modules accepted: Orders

## 2013-03-02 NOTE — Telephone Encounter (Signed)
Go ahead and add for dx raynaud dz-thanks

## 2013-03-02 NOTE — Telephone Encounter (Signed)
Spoke with Terri and ANA has already been added

## 2013-03-03 LAB — LDL CHOLESTEROL, DIRECT: Direct LDL: 160.9 mg/dL

## 2013-03-09 ENCOUNTER — Encounter: Payer: Self-pay | Admitting: Family Medicine

## 2013-03-09 ENCOUNTER — Ambulatory Visit (INDEPENDENT_AMBULATORY_CARE_PROVIDER_SITE_OTHER): Payer: 59 | Admitting: Family Medicine

## 2013-03-09 VITALS — BP 132/86 | HR 67 | Temp 98.2°F | Ht 63.75 in | Wt 159.5 lb

## 2013-03-09 DIAGNOSIS — E785 Hyperlipidemia, unspecified: Secondary | ICD-10-CM

## 2013-03-09 DIAGNOSIS — I10 Essential (primary) hypertension: Secondary | ICD-10-CM

## 2013-03-09 DIAGNOSIS — F430 Acute stress reaction: Secondary | ICD-10-CM

## 2013-03-09 MED ORDER — AMLODIPINE BESYLATE 2.5 MG PO TABS: 2.5000 mg | ORAL_TABLET | Freq: Every day | ORAL | Status: DC

## 2013-03-09 MED ORDER — ATORVASTATIN CALCIUM 20 MG PO TABS: 20.0000 mg | ORAL_TABLET | Freq: Every day | ORAL | Status: DC

## 2013-03-09 NOTE — Assessment & Plan Note (Signed)
bp in fair control at this time  No changes needed  Disc lifstyle change with low sodium diet and exercise   Rev labs today 

## 2013-03-09 NOTE — Progress Notes (Signed)
Subjective:    Patient ID: Brittney Johnson, female    DOB: [birth year], 57 y.o.   MRN: [id number]  HPI Here for f/u of chronic medical problems   Has mother in a nursing home - and gets calls frequently in the middle of the night  She is not doing well -has dementia and it is a very stressful to deal with it   Wt is up 2 lb with bmi of 27  bp is stable today  No cp or palpitations or headaches or edema  No side effects to medicines  Has not taken med yet this am  BP Readings from Last 3 Encounters:  03/09/13 132/86  04/28/12 158/94  02/05/12 130/80     Hyperlipidemia Lab Results  Component Value Date   CHOL 244* 03/02/2013   CHOL 186 05/14/2012   CHOL 248* 02/03/2012   Lab Results  Component Value Date   HDL 57.90 03/02/2013   HDL 56.80 05/14/2012   HDL 63.50 02/03/2012   Lab Results  Component Value Date   LDLCALC 106* 05/14/2012   LDLCALC 145* 07/12/2010   Lab Results  Component Value Date   TRIG 181.0* 03/02/2013   TRIG 115.0 05/14/2012   TRIG 163.0* 02/03/2012   Lab Results  Component Value Date   CHOLHDL 4 03/02/2013   CHOLHDL 3 05/14/2012   CHOLHDL 4 02/03/2012   Lab Results  Component Value Date   LDLDIRECT 160.9 03/02/2013   LDLDIRECT 152.1 02/03/2012   LDLDIRECT 128.7 05/31/2011   on generic mevacor and diet  She forgets it occasionally at night   She eats well and avoids sat fats  Finding time to exercise and eat right - that is tough  Mood- is stressed  Taking care of her mother and her dog is sick too Managing ok  Motivated and also no anhedonia Will watch  Has good support  Mammogram -she will schedule at [facility]  Patient Active Problem List   Diagnosis Date Noted  . Routine general medical examination at a health care facility 12/05/2010  . Other screening mammogram 12/05/2010  . Raynaud disease 12/05/2010  . HYPERLIPIDEMIA 06/29/2010  . LEUKOCYTOPENIA UNSPECIFIED 06/29/2010  . HYPERTENSION, BENIGN ESSENTIAL 06/29/2010   Past Medical History   Diagnosis Date  . Hyperlipidemia   . Asthma   . Hypertension   . DDD (degenerative disc disease), cervical   . Atypical pneumonia 1998    not hosp   Past Surgical History  Procedure Laterality Date  . Abdominal hysterectomy      supracervical- L ovary remains  . Cardiovascular stress test      08/28/06 normal    History  Substance Use Topics  . Smoking status: Former Smoker    Quit date: 07/29/1997  . Smokeless tobacco: Not on file  . Alcohol Use: Yes     Comment: occassionally   Family History  Problem Relation Age of Onset  . Lung cancer Father   . Hyperlipidemia Mother   . Diabetes Mother   . Hypertension Mother   . Lung cancer      uncle  . Coronary artery disease Maternal Grandfather   . Pancreatic cancer      unspecified family member  . Diabetes      aunt   Allergies  Allergen Reactions  . Codeine     REACTION: passed out   Current Outpatient Prescriptions on File Prior to Visit  Medication Sig Dispense Refill  . albuterol (VENTOLIN HFA) 108 (90 BASE) MCG/ACT inhaler  Inhale 2 puffs into the lungs every 4 (four) hours as needed for wheezing. 2 puffs prior to exercise as needed.  1 Inhaler  3  . amLODipine (NORVASC) 2.5 MG tablet TAKE 1 TABLET BY MOUTH DAILY.  30 tablet  11  . aspirin 81 MG chewable tablet Chew 81 mg by mouth daily.        . Calcium Carbonate-Vit D-Min 600-400 MG-UNIT TABS Take 1 tablet by mouth 2 (two) times daily.       . cholecalciferol (VITAMIN D) 400 UNITS TABS Take 400 Units by mouth daily.      Marland Kitchen lovastatin (MEVACOR) 40 MG tablet Take 1 tablet (40 mg total) by mouth daily.  30 tablet  5  . Omega-3 Fatty Acids (FISH OIL) 1000 MG CAPS Take 1 capsule by mouth 2 (two) times daily.         No current facility-administered medications on file prior to visit.      Review of Systems Review of Systems  Constitutional: Negative for fever, appetite change, fatigue and unexpected weight change.  Eyes: Negative for pain and visual  disturbance.  Respiratory: Negative for cough and shortness of breath.   Cardiovascular: Negative for cp or palpitations    Gastrointestinal: Negative for nausea, diarrhea and constipation.  Genitourinary: Negative for urgency and frequency.  Skin: Negative for pallor or rash   MSK pos for mild aches and pains without joint changes  Neurological: Negative for weakness, light-headedness, numbness and headaches.  Hematological: Negative for adenopathy. Does not bruise/bleed easily.  Psychiatric/Behavioral: Negative for dysphoric mood. The patient is not nervous/anxious.  pos for stressors       Objective:   Physical Exam  Constitutional: She appears well-developed and well-nourished. No distress.  HENT:  Head: Normocephalic and atraumatic.  Mouth/Throat: Oropharynx is clear and moist.  Eyes: Conjunctivae and EOM are normal. Pupils are equal, round, and reactive to light. Right eye exhibits no discharge. Left eye exhibits no discharge. No scleral icterus.  Neck: Normal range of motion. Neck supple. No JVD present. No thyromegaly present.  Cardiovascular: Normal rate, regular rhythm, normal heart sounds and intact distal pulses.  Exam reveals no gallop.   Pulmonary/Chest: Effort normal and breath sounds normal. No respiratory distress. She has no wheezes.  Abdominal: Soft. Bowel sounds are normal. She exhibits no distension, no abdominal bruit and no mass. There is no tenderness.  Musculoskeletal: She exhibits no edema.  Lymphadenopathy:    She has no cervical adenopathy.  Neurological: She is alert. She has normal reflexes. No cranial nerve deficit. She exhibits normal muscle tone. Coordination normal.  Skin: Skin is dry. No rash noted.  Psychiatric: She has a normal mood and affect. Her speech is normal. Thought content normal. Her mood appears not anxious. She does not exhibit a depressed mood.  pleasant          Assessment & Plan:

## 2013-03-09 NOTE — Assessment & Plan Note (Signed)
Lipids are not optimal on mevacor 40 Will try lipitor 20 generic if covered Disc goals for lipids and reasons to control them Rev labs with pt Rev low sat fat diet in detail  Re check 6 weeks

## 2013-03-09 NOTE — Patient Instructions (Addendum)
Avoid red meat/ fried foods/ egg yolks/ fatty breakfast meats/ butter, cheese and high fat dairy/ and shellfish  Change cholesterol medicine to generic lipitor 20 mg daily  Schedule fasting labs in 6 weeks Take care of yourself the best you can

## 2013-03-09 NOTE — Assessment & Plan Note (Signed)
Disc care of parent with dementia Overall good coping skills and somewhat good support  Disc imp of self care Offered counseling at any time if she wants it- declined for now

## 2013-03-15 ENCOUNTER — Telehealth: Payer: Self-pay | Admitting: Family Medicine

## 2013-03-15 MED ORDER — BUSPIRONE HCL 15 MG PO TABS: 7.5000 mg | ORAL_TABLET | Freq: Two times a day (BID) | ORAL | Status: DC

## 2013-03-15 NOTE — Telephone Encounter (Signed)
Called pt and no answer and no voicemail, will try to call pt later

## 2013-03-15 NOTE — Telephone Encounter (Signed)
Pt advised Rx sent to pharmacy and pt advise of Dr. Royden Purl recommendations

## 2013-03-15 NOTE — Telephone Encounter (Signed)
Pt hat a CPE last week and you all discussed medications to help pt deal with her mother's dementia. Pt called today because her mother passed away yesterday. Pt wants to know if you can send in a Rx (short term) to help her cope with her mother's passing, pt would like something mild that wont sedate her really bad and make her feel "loopy" she just needs an Rx to help her cope. Pt uses [facility] on [address] please advise

## 2013-03-15 NOTE — Telephone Encounter (Signed)
We can try some buspar 1/2 pill twice daily - if this gives her side effects or if she feels worse let me know (not typical but people do react differently to these sort of medicines - [facility] will [facility] to [facility] Let me know if she feels she would benefit from some counseling-I can refer to her  Tell her I'm very sorry to hear the news- try to take care of yourself

## 2013-04-14 ENCOUNTER — Ambulatory Visit: Payer: Self-pay | Admitting: Family Medicine

## 2013-04-15 ENCOUNTER — Encounter: Payer: Self-pay | Admitting: Family Medicine

## 2013-04-20 ENCOUNTER — Other Ambulatory Visit (INDEPENDENT_AMBULATORY_CARE_PROVIDER_SITE_OTHER): Payer: Managed Care, Other (non HMO)

## 2013-04-20 DIAGNOSIS — E785 Hyperlipidemia, unspecified: Secondary | ICD-10-CM

## 2013-04-20 DIAGNOSIS — Z23 Encounter for immunization: Secondary | ICD-10-CM

## 2013-04-20 LAB — LIPID PANEL
Cholesterol: 190 mg/dL (ref 0–200)
HDL: 64.6 mg/dL (ref >39.00)
Triglycerides: 143 mg/dL (ref 0.0–149.0)
VLDL: 28.6 mg/dL (ref 0.0–40.0)

## 2013-06-03 ENCOUNTER — Telehealth: Payer: Self-pay

## 2013-06-03 NOTE — Telephone Encounter (Signed)
Pt request lab results from 04/20/13; advised was on mychart but pt said she is not on my chart. Pt advised from lab result note, improved cholesterol and continue current medicine. Pt voiced understanding.

## 2013-06-17 ENCOUNTER — Ambulatory Visit (INDEPENDENT_AMBULATORY_CARE_PROVIDER_SITE_OTHER): Payer: Managed Care, Other (non HMO) | Admitting: Family Medicine

## 2013-06-17 ENCOUNTER — Encounter: Payer: Self-pay | Admitting: Family Medicine

## 2013-06-17 VITALS — BP 140/84 | HR 80 | Temp 98.1°F | Ht 63.75 in | Wt 162.2 lb

## 2013-06-17 DIAGNOSIS — S39012A Strain of muscle, fascia and tendon of lower back, initial encounter: Secondary | ICD-10-CM | POA: Insufficient documentation

## 2013-06-17 DIAGNOSIS — S335XXA Sprain of ligaments of lumbar spine, initial encounter: Secondary | ICD-10-CM

## 2013-06-17 MED ORDER — DICLOFENAC SODIUM 75 MG PO TBEC: 75.0000 mg | DELAYED_RELEASE_TABLET | Freq: Two times a day (BID) | ORAL | Status: DC

## 2013-06-17 MED ORDER — CYCLOBENZAPRINE HCL 10 MG PO TABS: 10.0000 mg | ORAL_TABLET | Freq: Every evening | ORAL | Status: DC | PRN

## 2013-06-17 NOTE — Progress Notes (Signed)
  Subjective:    Patient ID: Brittney Johnson, female    DOB: 09-Dec-1955, 57 y.o.   MRN: [id number]  HPI  57 year old female with history of past  intermittant low back pain over past 7 years with new flare of low back pain in right after leaning forward to floor then worsened after unloading dishwasher. Ongining x 1 week.  No radiation of pain.  Decrease ROM.. Cannot lean forward with out pain, increase in pain with walking.  Some better with heating pad sitting.  Stiffness in low back. No numbness, now weakness.  No fever, no incontinence.   She has been using ibuprofen and aleve. Helps minimally.  No history of back surgery.      Review of Systems  Constitutional: Negative for fever and fatigue.  HENT: Negative for ear pain.   Eyes: Negative for pain.  Respiratory: Negative for chest tightness and shortness of breath.   Cardiovascular: Negative for chest pain, palpitations and leg swelling.  Gastrointestinal: Negative for abdominal pain.  Genitourinary: Negative for dysuria.       Objective:   Physical Exam  Constitutional: She is oriented to person, place, and time. She appears well-developed and well-nourished.  Eyes: Conjunctivae are normal. Pupils are equal, round, and reactive to light.  Neck: Normal range of motion. Neck supple. No thyromegaly present.  Cardiovascular: Normal rate and regular rhythm.   No murmur heard. Pulmonary/Chest: Effort normal and breath sounds normal.  Abdominal: Soft. Bowel sounds are normal. There is no tenderness.  Musculoskeletal:       Lumbar back: She exhibits decreased range of motion, tenderness and bony tenderness. She exhibits no swelling.  Neg SLR, but pain ttp in right paraspinous muscle  Neurological: She is alert and oriented to person, place, and time. She has normal strength. No sensory deficit. Gait abnormal.  Antalgic gait.  Skin: Skin is warm. No rash noted.          Assessment & Plan:

## 2013-06-17 NOTE — Assessment & Plan Note (Signed)
NSAIDS, muscle relaxant, massage, heat and home PT.

## 2013-06-17 NOTE — Patient Instructions (Signed)
Start diclofenac 75 mg 1 tablet twice daily. Can use muscle relaxant flexeril at bedtime. Heat, massage, gentle stretching. Call if not improving in 2 weeks. Call sooner if increased pain.

## 2013-06-17 NOTE — Progress Notes (Signed)
Pre-visit discussion using our clinic review tool. No additional management support is needed unless otherwise documented below in the visit note.  

## 2013-10-26 ENCOUNTER — Encounter: Payer: Self-pay | Admitting: Family Medicine

## 2013-10-26 ENCOUNTER — Ambulatory Visit (INDEPENDENT_AMBULATORY_CARE_PROVIDER_SITE_OTHER): Payer: BC Managed Care – PPO | Admitting: Family Medicine

## 2013-10-26 VITALS — BP 130/78 | HR 74 | Temp 98.4°F | Ht 63.75 in | Wt 160.0 lb

## 2013-10-26 DIAGNOSIS — J111 Influenza due to unidentified influenza virus with other respiratory manifestations: Secondary | ICD-10-CM | POA: Insufficient documentation

## 2013-10-26 NOTE — Progress Notes (Signed)
Pre visit review using our clinic review tool, if applicable. No additional management support is needed unless otherwise documented below in the visit note. 

## 2013-10-26 NOTE — Patient Instructions (Signed)
I think you have had the flu  Drink lots of fluids and rest  The alka selzer medicine is ok  In addition to that -you can take 1-2 aleve with food every 12 hours as needed for fever and aches  Update if not starting to improve in a week or if worsening  Stay home today and tomorrow

## 2013-10-26 NOTE — Progress Notes (Signed)
Subjective:    Patient ID: Brittney Johnson, female    DOB: 1956-01-15, 58 y.o.   MRN: [id number]  HPI Here with uri symptoms since Saturday  Fever as high as 101.6 with chills and aches   Headache-mild on and off  Cong and nasal drainage  Mucous is brown in color  Coughing - some prod (not a lot)- rattly    Today took alka selzer cold and flu -temp is down  No there otc meds  Delsym -cannot stand the taste   Today is a little better than yesterday   Patient Active Problem List   Diagnosis Date Noted  . Low back strain 06/17/2013  . Stress reaction 03/09/2013  . Routine general medical examination at a health care facility 12/05/2010  . Other screening mammogram 12/05/2010  . Raynaud disease 12/05/2010  . HYPERLIPIDEMIA 06/29/2010  . LEUKOCYTOPENIA UNSPECIFIED 06/29/2010  . HYPERTENSION, BENIGN ESSENTIAL 06/29/2010   Past Medical History  Diagnosis Date  . Hyperlipidemia   . Asthma   . Hypertension   . DDD (degenerative disc disease), cervical   . Atypical pneumonia 1998    not hosp   Past Surgical History  Procedure Laterality Date  . Abdominal hysterectomy      supracervical- L ovary remains  . Cardiovascular stress test      08/28/06 normal    History  Substance Use Topics  . Smoking status: Former Smoker    Quit date: 07/29/1997  . Smokeless tobacco: Never Used  . Alcohol Use: Yes     Comment: occassionally   Family History  Problem Relation Age of Onset  . Lung cancer Father   . Hyperlipidemia Mother   . Diabetes Mother   . Hypertension Mother   . Lung cancer      uncle  . Coronary artery disease Maternal Grandfather   . Pancreatic cancer      unspecified family member  . Diabetes      aunt   Allergies  Allergen Reactions  . Codeine     REACTION: passed out   Current Outpatient Prescriptions on File Prior to Visit  Medication Sig Dispense Refill  . albuterol (VENTOLIN HFA) 108 (90 BASE) MCG/ACT inhaler Inhale 2 puffs into the lungs  every 4 (four) hours as needed for wheezing. 2 puffs prior to exercise as needed.  1 Inhaler  3  . amLODipine (NORVASC) 2.5 MG tablet Take 1 tablet (2.5 mg total) by mouth daily.  30 tablet  11  . aspirin 81 MG chewable tablet Chew 81 mg by mouth daily.        Marland Kitchen atorvastatin (LIPITOR) 20 MG tablet Take 1 tablet (20 mg total) by mouth daily.  30 tablet  11  . Calcium Carbonate-Vit D-Min 600-400 MG-UNIT TABS Take 1 tablet by mouth 2 (two) times daily.       . cholecalciferol (VITAMIN D) 400 UNITS TABS Take 400 Units by mouth daily.      . Omega-3 Fatty Acids (FISH OIL) 1000 MG CAPS Take 1 capsule by mouth 2 (two) times daily.         No current facility-administered medications on file prior to visit.      Review of Systems Review of Systems  Constitutional: pos for fever and chills and malaise ENT pos for congestion and rhinorrhea and headache   Eyes: Negative for pain and visual disturbance.  Respiratory: Negative for wheeze  and shortness of breath.   Cardiovascular: Negative for cp or palpitations  Gastrointestinal: Negative for nausea, diarrhea and constipation.  Genitourinary: Negative for urgency and frequency.  Skin: Negative for pallor or rash   Neurological: Negative for weakness, light-headedness, numbness and headaches.  Hematological: Negative for adenopathy. Does not bruise/bleed easily.  Psychiatric/Behavioral: Negative for dysphoric mood. The patient is not nervous/anxious.         Objective:   Physical Exam  Constitutional: She appears well-developed and well-nourished. No distress.  HENT:  Head: Normocephalic and atraumatic.  Right Ear: External ear normal.  Left Ear: External ear normal.  Mouth/Throat: Oropharynx is clear and moist. No oropharyngeal exudate.  Nares are injected and congested  No sinus tenderness Clear post nasal drip   Eyes: Conjunctivae and EOM are normal. Pupils are equal, round, and reactive to light. Right eye exhibits no discharge. Left  eye exhibits no discharge.  Neck: Normal range of motion. Neck supple.  Cardiovascular: Normal rate and regular rhythm.   Pulmonary/Chest: Effort normal and breath sounds normal. No respiratory distress. She has no wheezes. She has no rales. She exhibits no tenderness.  Musculoskeletal: She exhibits no edema.  Lymphadenopathy:    She has no cervical adenopathy.  Neurological: She is alert.  Skin: Skin is warm and dry. No rash noted. No erythema.  Psychiatric: She has a normal mood and affect.          Assessment & Plan:

## 2013-10-28 NOTE — Assessment & Plan Note (Signed)
Clinical dx  Reassuring exam  Disc symptomatic care - see instructions on AVS  Out of the window for tamiflu Update if not starting to improve in a week or if worsening

## 2014-01-26 ENCOUNTER — Ambulatory Visit (INDEPENDENT_AMBULATORY_CARE_PROVIDER_SITE_OTHER): Payer: BC Managed Care – PPO | Admitting: Family Medicine

## 2014-01-26 ENCOUNTER — Encounter: Payer: Self-pay | Admitting: Family Medicine

## 2014-01-26 VITALS — BP 130/80 | HR 76 | Temp 98.2°F | Ht 63.75 in | Wt 159.5 lb

## 2014-01-26 DIAGNOSIS — J029 Acute pharyngitis, unspecified: Secondary | ICD-10-CM

## 2014-01-26 LAB — POCT RAPID STREP A (OFFICE): Rapid Strep A Screen: NEGATIVE

## 2014-01-26 MED ORDER — AMOXICILLIN 500 MG PO CAPS: 1000.0000 mg | ORAL_CAPSULE | Freq: Two times a day (BID) | ORAL | Status: DC

## 2014-01-26 NOTE — Progress Notes (Signed)
[facility] [place] [address] Phone: [phone] Fax: [phone]  Patient ID: Brittney Johnson MRN: [id number], DOB: 1956/06/27, 58 y.o. Date of Encounter: 01/26/2014  Primary Physician:  Loura Pardon, MD   Chief Complaint: Sore Throat   Subjective:   History of Present Illness:  Brittney Johnson is a 58 y.o. very pleasant female patient who presents with the following:  No cold symptoms. 5 days - isolated sore throat.  Low level temp.  Hurts to swallow.  No URI sx, no pulm sx  Past Medical History, Surgical History, Social History, Family History, Problem List, Medications, and Allergies have been reviewed and updated if relevant.  Review of Systems: ROS: GEN: Acute illness details above GI: Tolerating PO intake GU: maintaining adequate hydration and urination Pulm: No SOB Interactive and getting along well at home.  Otherwise, ROS is as per the HPI.   Objective:   Physical Examination: BP 130/80  Pulse 76  Temp(Src) 98.2 F (36.8 C) (Oral)  Ht 5' 3.75" (1.619 m)  Wt 159 lb 8 oz (72.349 kg)  BMI 27.60 kg/m2  LMP 07/30/2003   GEN: A and O x 3. WDWN. NAD.    ENT: Nose clear, ext NML.  + submental LAD.  No JVD.  TM's clear. Oropharynx clear.  PULM: Normal WOB, no distress. No crackles, wheezes, rhonchi. CV: RRR, no M/G/R, No rubs, No JVD.   EXT: warm and well-perfused, No c/c/e. PSYCH: Pleasant and conversant.    Laboratory and Imaging Data: Results for orders placed in visit on 01/26/14  POCT RAPID STREP A (OFFICE)      Result Value Ref Range   Rapid Strep A Screen Negative  Negative     Assessment & Plan:   Sore throat - Plan: POCT rapid strep A  ? False neg, no other sx with pos nodes Discussed culture, but she is going out of town to be with relative with + CA history, so more helpful to treat.  New Prescriptions   AMOXICILLIN (AMOXIL) 500 MG CAPSULE    Take 2 capsules (1,000 mg total) by mouth 2 (two) times daily.   Modified  Medications   No medications on file   Orders Placed This Encounter  Procedures  . POCT rapid strep A   Follow-up: No Follow-up on file. Unless noted above, the patient is to follow-up if symptoms worsen. Red flags were reviewed with the patient.  Signed,  Maud Deed. Mrk Buzby, MD, CAQ Sports Medicine   Discontinued Medications   No medications on file   Current Medications at Discharge:   Medication List       This list is accurate as of: 01/26/14  1:50 PM.  Always use your most recent med list.               albuterol 108 (90 BASE) MCG/ACT inhaler  Commonly known as:  VENTOLIN HFA  Inhale 2 puffs into the lungs every 4 (four) hours as needed for wheezing. 2 puffs prior to exercise as needed.     amLODipine 2.5 MG tablet  Commonly known as:  NORVASC  Take 1 tablet (2.5 mg total) by mouth daily.     amoxicillin 500 MG capsule  Commonly known as:  AMOXIL  Take 2 capsules (1,000 mg total) by mouth 2 (two) times daily.     aspirin 81 MG chewable tablet  Chew 81 mg by mouth daily.     atorvastatin 20 MG tablet  Commonly known as:  LIPITOR  Take 1 tablet (20 mg total) by mouth daily.     Calcium Carbonate-Vit D-Min 600-400 MG-UNIT Tabs  Take 1 tablet by mouth 2 (two) times daily.     cholecalciferol 400 UNITS Tabs tablet  Commonly known as:  VITAMIN D  Take 400 Units by mouth daily.     Fish Oil 1000 MG Caps  Take 1 capsule by mouth 2 (two) times daily.

## 2014-01-26 NOTE — Progress Notes (Signed)
Pre visit review using our clinic review tool, if applicable. No additional management support is needed unless otherwise documented below in the visit note. 

## 2014-02-01 ENCOUNTER — Other Ambulatory Visit: Payer: BC Managed Care – PPO

## 2014-02-01 ENCOUNTER — Telehealth: Payer: Self-pay | Admitting: Family Medicine

## 2014-02-01 DIAGNOSIS — Z0000 Encounter for general adult medical examination without abnormal findings: Secondary | ICD-10-CM

## 2014-02-01 NOTE — Telephone Encounter (Signed)
Message copied by Abner Greenspan on Tue Feb 01, 2014  2:00 PM ------      [name]      Created: Tue Jan 25, 2014 10:49 AM      Regarding: Lab orders for Wednesday, 7.8.15       Patient is scheduled for CPX labs, please order future labs, Thanks , Terri       ------

## 2014-02-02 ENCOUNTER — Other Ambulatory Visit (INDEPENDENT_AMBULATORY_CARE_PROVIDER_SITE_OTHER): Payer: BC Managed Care – PPO

## 2014-02-02 DIAGNOSIS — E785 Hyperlipidemia, unspecified: Secondary | ICD-10-CM

## 2014-02-02 DIAGNOSIS — D72819 Decreased white blood cell count, unspecified: Secondary | ICD-10-CM

## 2014-02-02 DIAGNOSIS — I10 Essential (primary) hypertension: Secondary | ICD-10-CM

## 2014-02-02 DIAGNOSIS — Z0000 Encounter for general adult medical examination without abnormal findings: Secondary | ICD-10-CM

## 2014-02-02 LAB — COMPREHENSIVE METABOLIC PANEL
ALK PHOS: 58 U/L (ref 39–117)
ALT: 19 U/L (ref 0–35)
AST: 19 U/L (ref 0–37)
Albumin: 4.1 g/dL (ref 3.5–5.2)
BUN: 11 mg/dL (ref 6–23)
CALCIUM: 9.6 mg/dL (ref 8.4–10.5)
CHLORIDE: 104 meq/L (ref 96–112)
CO2: 28 mEq/L (ref 19–32)
Creatinine, Ser: 0.7 mg/dL (ref 0.4–1.2)
GFR: 90.63 mL/min (ref >60.00)
Glucose, Bld: 101 mg/dL — ABNORMAL HIGH (ref 70–99)
POTASSIUM: 4.2 meq/L (ref 3.5–5.1)
Sodium: 138 mEq/L (ref 135–145)
Total Bilirubin: 0.5 mg/dL (ref 0.2–1.2)
Total Protein: 7.8 g/dL (ref 6.0–8.3)

## 2014-02-02 LAB — CBC WITH DIFFERENTIAL/PLATELET
BASOS PCT: 0.7 % (ref 0.0–3.0)
Basophils Absolute: 0 10*3/uL (ref 0.0–0.1)
EOS PCT: 1.4 % (ref 0.0–5.0)
Eosinophils Absolute: 0.1 10*3/uL (ref 0.0–0.7)
HCT: 37.5 % (ref 36.0–46.0)
Hemoglobin: 12.6 g/dL (ref 12.0–15.0)
LYMPHS PCT: 39.1 % (ref 12.0–46.0)
Lymphs Abs: 2 10*3/uL (ref 0.7–4.0)
MCHC: 33.5 g/dL (ref 30.0–36.0)
MCV: 86.5 fl (ref 78.0–100.0)
Monocytes Absolute: 0.6 10*3/uL (ref 0.1–1.0)
Monocytes Relative: 11.3 % (ref 3.0–12.0)
NEUTROS PCT: 47.5 % (ref 43.0–77.0)
Neutro Abs: 2.4 10*3/uL (ref 1.4–7.7)
Platelets: 246 10*3/uL (ref 150.0–400.0)
RBC: 4.34 Mil/uL (ref 3.87–5.11)
RDW: 14 % (ref 11.5–15.5)
WBC: 5.1 10*3/uL (ref 4.0–10.5)

## 2014-02-02 LAB — LIPID PANEL
CHOL/HDL RATIO: 3
Cholesterol: 175 mg/dL (ref 0–200)
HDL: 52.2 mg/dL (ref >39.00)
LDL CALC: 96 mg/dL (ref 0–99)
NONHDL: 122.8
TRIGLYCERIDES: 133 mg/dL (ref 0.0–149.0)
VLDL: 26.6 mg/dL (ref 0.0–40.0)

## 2014-02-02 LAB — TSH: TSH: 0.14 u[IU]/mL — ABNORMAL LOW (ref 0.35–4.50)

## 2014-02-08 ENCOUNTER — Encounter: Payer: Self-pay | Admitting: Family Medicine

## 2014-02-08 ENCOUNTER — Ambulatory Visit (INDEPENDENT_AMBULATORY_CARE_PROVIDER_SITE_OTHER): Payer: BC Managed Care – PPO | Admitting: Family Medicine

## 2014-02-08 VITALS — BP 124/74 | HR 71 | Temp 98.0°F | Ht 64.5 in | Wt 160.2 lb

## 2014-02-08 DIAGNOSIS — R7989 Other specified abnormal findings of blood chemistry: Secondary | ICD-10-CM

## 2014-02-08 DIAGNOSIS — M720 Palmar fascial fibromatosis [Dupuytren]: Secondary | ICD-10-CM

## 2014-02-08 DIAGNOSIS — Z0000 Encounter for general adult medical examination without abnormal findings: Secondary | ICD-10-CM

## 2014-02-08 DIAGNOSIS — I7300 Raynaud's syndrome without gangrene: Secondary | ICD-10-CM

## 2014-02-08 DIAGNOSIS — E785 Hyperlipidemia, unspecified: Secondary | ICD-10-CM

## 2014-02-08 DIAGNOSIS — I10 Essential (primary) hypertension: Secondary | ICD-10-CM

## 2014-02-08 MED ORDER — AMLODIPINE BESYLATE 5 MG PO TABS: 5.0000 mg | ORAL_TABLET | Freq: Every day | ORAL | Status: DC

## 2014-02-08 MED ORDER — ATORVASTATIN CALCIUM 20 MG PO TABS: 20.0000 mg | ORAL_TABLET | Freq: Every day | ORAL | Status: DC

## 2014-02-08 NOTE — Patient Instructions (Addendum)
If you are interested in a shingles/zoster vaccine - call your insurance to check on coverage,( you should not get it within 1 month of other vaccines) , then call us for a prescription  for it to take to a pharmacy that gives the shot , or make a nurse visit to get it here depending on your coverage   Increase your amlodipine to 5 mg daily to see if it helps with your raynaud's  If low blood pressure or dizzy- but back to 2.5 mg -let me know how it goes   Re checking thyroid tests today for an abnormal screening test

## 2014-02-08 NOTE — Progress Notes (Signed)
Subjective:    Patient ID: Brittney Johnson, female    DOB: [birth year], 58 y.o.   MRN: [id number]  HPI Here for health maintenance exam and to review chronic medical problems    Doing well for the most part  Still has the same chronic problems   She saw Dr Jefm Bryant in the past for Raynauds -last ANA was normal  She still suffers from it - seemingly worse  Now is in the top of her L hand  Would be open to go up on norvasc   Wt is stable with bmi of 27  Pap 7/11 Has hx of supracervical hysterectomy with one ovary remaining Does not see a gyn  She would rather not have the pap-no new partners and no hx of abn pap smears  No gyn symptoms at all, no bleeding    Zoster status - may be interested in the vaccine  Flu vaccine 9/14 Td 5/12  Mammogram 9/14 nl  Self exam- no lumps or changes   colonosc 07-recall was 10 years   Hypertension Amlodipine bp is stable today  No cp or palpitations or headaches or edema  No side effects to medicines  BP Readings from Last 3 Encounters:  02/08/14 124/74  01/26/14 130/80  10/26/13 130/78     Hyperlipidemia On lipitor and diet  Lab Results  Component Value Date   CHOL 175 02/02/2014   CHOL 190 04/20/2013   CHOL 244* 03/02/2013   Lab Results  Component Value Date   HDL 52.20 02/02/2014   HDL 64.60 04/20/2013   HDL 57.90 03/02/2013   Lab Results  Component Value Date   LDLCALC 96 02/02/2014   LDLCALC 97 04/20/2013   LDLCALC 106* 05/14/2012   Lab Results  Component Value Date   TRIG 133.0 02/02/2014   TRIG 143.0 04/20/2013   TRIG 181.0* 03/02/2013   Lab Results  Component Value Date   CHOLHDL 3 02/02/2014   CHOLHDL 3 04/20/2013   CHOLHDL 4 03/02/2013   Lab Results  Component Value Date   LDLDIRECT 160.9 03/02/2013   LDLDIRECT 152.1 02/03/2012   LDLDIRECT 128.7 05/31/2011   overall stable but HDL went down  She is exercising less- busy season at [employer] time - she will work on it    tsh is abnormal Lab Results  Component Value  Date   TSH 0.14* 02/02/2014    No history of thyroid problems  No change in her neck  Wt is stable  She does notice some shakiness   Patient Active Problem List   Diagnosis Date Noted  . Low back strain 06/17/2013  . Stress reaction 03/09/2013  . Routine general medical examination at a health care facility 12/05/2010  . Other screening mammogram 12/05/2010  . Raynaud disease 12/05/2010  . HYPERLIPIDEMIA 06/29/2010  . LEUKOCYTOPENIA UNSPECIFIED 06/29/2010  . HYPERTENSION, BENIGN ESSENTIAL 06/29/2010   Past Medical History  Diagnosis Date  . Hyperlipidemia   . Asthma   . Hypertension   . DDD (degenerative disc disease), cervical   . Atypical pneumonia 1998    not hosp   Past Surgical History  Procedure Laterality Date  . Abdominal hysterectomy      supracervical- L ovary remains  . Cardiovascular stress test      08/28/06 normal    History  Substance Use Topics  . Smoking status: Former Smoker    Quit date: 07/29/1997  . Smokeless tobacco: Never Used  . Alcohol Use: Yes     Comment:  occassionally   Family History  Problem Relation Age of Onset  . Lung cancer Father   . Hyperlipidemia Mother   . Diabetes Mother   . Hypertension Mother   . Lung cancer      uncle  . Coronary artery disease Maternal Grandfather   . Pancreatic cancer      unspecified family member  . Diabetes      aunt   Allergies  Allergen Reactions  . Codeine     REACTION: passed out   Current Outpatient Prescriptions on File Prior to Visit  Medication Sig Dispense Refill  . albuterol (VENTOLIN HFA) 108 (90 BASE) MCG/ACT inhaler Inhale 2 puffs into the lungs every 4 (four) hours as needed for wheezing. 2 puffs prior to exercise as needed.  1 Inhaler  3  . amLODipine (NORVASC) 2.5 MG tablet Take 1 tablet (2.5 mg total) by mouth daily.  30 tablet  11  . aspirin 81 MG chewable tablet Chew 81 mg by mouth daily.        Marland Kitchen atorvastatin (LIPITOR) 20 MG tablet Take 1 tablet (20 mg total) by mouth  daily.  30 tablet  11  . Calcium Carbonate-Vit D-Min 600-400 MG-UNIT TABS Take 1 tablet by mouth 2 (two) times daily.       . cholecalciferol (VITAMIN D) 400 UNITS TABS Take 400 Units by mouth daily.      . Omega-3 Fatty Acids (FISH OIL) 1000 MG CAPS Take 1 capsule by mouth 2 (two) times daily.         No current facility-administered medications on file prior to visit.      Review of Systems Review of Systems  Constitutional: Negative for fever, appetite change, fatigue and unexpected weight change.  Eyes: Negative for pain and visual disturbance.  Respiratory: Negative for cough and shortness of breath.   Cardiovascular: Negative for cp or palpitations    Gastrointestinal: Negative for nausea, diarrhea and constipation.  Genitourinary: Negative for urgency and frequency.  Skin: Negative for pallor or rash   MSK pos for pain occ in palm of R hand that seems thickened  Neurological: Negative for weakness, light-headedness, numbness and headaches. pos for extreme coldness of hands and feet intermittently due to Raynauds- recently worsening., poss for occ tremor Hematological: Negative for adenopathy. Does not bruise/bleed easily.  Psychiatric/Behavioral: Negative for dysphoric mood. The patient is not nervous/anxious.         Objective:   Physical Exam  Constitutional: She appears well-developed and well-nourished. No distress.  HENT:  Head: Normocephalic and atraumatic.  Right Ear: External ear normal.  Left Ear: External ear normal.  Mouth/Throat: Oropharynx is clear and moist.  Eyes: Conjunctivae and EOM are normal. Pupils are equal, round, and reactive to light. No scleral icterus.  Neck: Normal range of motion. Neck supple. No JVD present. Carotid bruit is not present. No thyromegaly present.  Prominent and symmetric thyroid   Cardiovascular: Normal rate, regular rhythm, normal heart sounds and intact distal pulses.  Exam reveals no gallop.   Pulmonary/Chest: Effort normal  and breath sounds normal. No respiratory distress. She has no wheezes. She exhibits no tenderness.  Abdominal: Soft. Bowel sounds are normal. She exhibits no distension, no abdominal bruit and no mass. There is no tenderness.  Genitourinary: No breast swelling, tenderness, discharge or bleeding.  Breast exam: No mass, nodules, thickening, tenderness, bulging, retraction, inflamation, nipple discharge or skin changes noted.  No axillary or clavicular LA.     Pt declines gyn exam  today  Musculoskeletal: Normal range of motion. She exhibits no edema and no tenderness.  There is generalized thickening of R palm of hand over tendon with slt contracture of R 5th finger Nl grip and rom  No tenderness   Nl pulses in all ext   Lymphadenopathy:    She has no cervical adenopathy.  Neurological: She is alert. She has normal reflexes. She displays no tremor. No cranial nerve deficit. She exhibits normal muscle tone. Coordination normal.  Skin: Skin is warm and dry. No rash noted. No erythema. No pallor.  Psychiatric: She has a normal mood and affect.          Assessment & Plan:   Problem List Items Addressed This Visit     Cardiovascular and Mediastinum   HYPERTENSION, BENIGN ESSENTIAL - Primary      bp is stable today  No cp or palpitations or headaches or edema  No side effects to medicines  BP Readings from Last 3 Encounters:  02/08/14 124/74  01/26/14 130/80  10/26/13 130/78    Will inc amlodipine to 5 mg to see if it helps with Raynaud's as well-watch bp closely    Relevant Medications      amLODIpine (NORVASC) tablet      atorvastatin (LIPITOR) tablet   Raynaud disease     Has seen rheumatology in the past  Getting worse in L hand -now affecting dorsum of hand  Will inc amlodipine to 5 mg if tolerated  If no imp consider neuro consult or return to rheumatology (no autoimmune dz known so far)    Relevant Medications      amLODIpine (NORVASC) tablet      atorvastatin  (LIPITOR) tablet     Musculoskeletal and Integument   Dupuytren's contracture of right hand     Given pt handout on condition If it limits finger movement would consider hand surgeon consult      Other   HYPERLIPIDEMIA     Disc goals for lipids and reasons to control them Rev labs with pt Rev low sat fat diet in detail  Will continue lipitor and diet     Relevant Medications      amLODIpine (NORVASC) tablet      atorvastatin (LIPITOR) tablet   Routine general medical examination at a health care facility     Reviewed health habits including diet and exercise and skin cancer prevention Reviewed appropriate screening tests for age  Also reviewed health mt list, fam hx and immunization status , as well as social and family history   See HPI Labs reviewed     Abnormal TSH     Thyroid profile today     Relevant Orders      TSH      T4, Free      T3, Free

## 2014-02-08 NOTE — Progress Notes (Signed)
Pre visit review using our clinic review tool, if applicable. No additional management support is needed unless otherwise documented below in the visit note. 

## 2014-02-09 LAB — TSH: TSH: 0.15 u[IU]/mL — ABNORMAL LOW (ref 0.35–4.50)

## 2014-02-09 LAB — T3, FREE: T3, Free: 3.3 pg/mL (ref 2.3–4.2)

## 2014-02-09 LAB — T4, FREE: FREE T4: 1.15 ng/dL (ref 0.60–1.60)

## 2014-02-09 NOTE — Assessment & Plan Note (Signed)
Has seen rheumatology in the past  Getting worse in L hand -now affecting dorsum of hand  Will inc amlodipine to 5 mg if tolerated  If no imp consider neuro consult or return to rheumatology (no autoimmune dz known so far)

## 2014-02-09 NOTE — Assessment & Plan Note (Signed)
Given pt handout on condition If it limits finger movement would consider hand surgeon consult

## 2014-02-09 NOTE — Assessment & Plan Note (Signed)
Disc goals for lipids and reasons to control them Rev labs with pt Rev low sat fat diet in detail  Will continue lipitor and diet  

## 2014-02-09 NOTE — Assessment & Plan Note (Signed)
Thyroid profile today

## 2014-02-09 NOTE — Assessment & Plan Note (Signed)
bp is stable today  No cp or palpitations or headaches or edema  No side effects to medicines  BP Readings from Last 3 Encounters:  02/08/14 124/74  01/26/14 130/80  10/26/13 130/78    Will inc amlodipine to 5 mg to see if it helps with Raynaud's as well-watch bp closely

## 2014-02-09 NOTE — Assessment & Plan Note (Signed)
Reviewed health habits including diet and exercise and skin cancer prevention Reviewed appropriate screening tests for age  Also reviewed health mt list, fam hx and immunization status , as well as social and family history   See HPI Labs reviewed  

## 2014-02-10 ENCOUNTER — Telehealth: Payer: Self-pay

## 2014-02-10 DIAGNOSIS — R7989 Other specified abnormal findings of blood chemistry: Secondary | ICD-10-CM

## 2014-02-10 NOTE — Telephone Encounter (Signed)
Pt could not figure out how to answer back on mychart; pt said she is willing to see thyroid specialist and pt can be reached at work [phone] or cell [phone].

## 2014-02-10 NOTE — Telephone Encounter (Signed)
Please see if someone can help her with mychart or unenroll her in it thanks  I will refer to endocrinologist

## 2014-02-11 NOTE — Telephone Encounter (Signed)
Didn't want to un enroll, she didn't know how to respond to results

## 2014-02-22 ENCOUNTER — Ambulatory Visit (INDEPENDENT_AMBULATORY_CARE_PROVIDER_SITE_OTHER): Payer: BC Managed Care – PPO | Admitting: Endocrinology

## 2014-02-22 ENCOUNTER — Encounter: Payer: Self-pay | Admitting: Endocrinology

## 2014-02-22 VITALS — BP 110/70 | HR 82 | Temp 98.3°F | Ht 64.5 in | Wt 162.2 lb

## 2014-02-22 DIAGNOSIS — R7989 Other specified abnormal findings of blood chemistry: Secondary | ICD-10-CM

## 2014-02-22 LAB — T3, FREE: T3 FREE: 3.4 pg/mL (ref 2.3–4.2)

## 2014-02-22 LAB — T4, FREE: FREE T4: 1.09 ng/dL (ref 0.60–1.60)

## 2014-02-22 NOTE — Patient Instructions (Signed)
Please watch for symptoms of overactive or underactive thyroid ( weight changes, alteration of bowel habits, changes to your neck or tenderness). Labs today.  Report if symptoms worsen.  Please come back for a follow-up appointment in 2 months.

## 2014-02-22 NOTE — Progress Notes (Signed)
Pre visit review using our clinic review tool, if applicable. No additional management support is needed unless otherwise documented below in the visit note. 

## 2014-02-22 NOTE — Progress Notes (Signed)
Reason for visit: Low TSH HPI  Brittney Johnson is a 58 y.o.-year-old female, referred by her PCP,  Loura Pardon, MD, for evaluation for low TSH.  The patient denies any prior personal history of thyroid problems or any family history of thyroid disorders. she denies any personal XRT exposure to her neck area. Patient denies any recent hospitalization, or major change in his medical history. No recent medication changes except amlodipine. Denies tenderness in neck area.  Recently in early July, she had an episode of Sore throat for which she completed 1 week course of abx. Her sore throat resolved and after a few days, it returned when she felt " knot in her neck" when she was swallowing. This symptom resolved last Thursday. She didn't see any lumps or tenderness in her thyroid area then.  No fever. Denies any recent ingestion of seaweed/kelp or recent exposure to iv contrast dye. No recent steroid use or use of herbal supplements.    I reviewed pt's thyroid tests: Lab Results  Component Value Date   TSH 0.15* 02/08/2014   TSH 0.14* 02/02/2014   TSH 2.07 03/02/2013   TSH 1.63 02/03/2012   TSH 1.80 05/31/2011   TSH 1.18 12/05/2010   FREET4 1.15 02/08/2014    Other pertinent labs are as follows: Free T3 is in normal range at 3.3 recently.    Patient denies feeling nodules in neck, hoarseness, dysphagia now , SOB with lying down;  Review of systems is positive for  + heat intolerance after hysterectomy 2005 + tremors, attributes to old age + fatigue ( month or so) + appetite ( couple of months )  + 2-3 lbs weight gain in 19months, slow gain of 20 lbs over the preceeding several years due to lifestyle   Review of systems is negative for  - anxiety - palpitations - diarrhea - weight loss   I reviewed her chart and she also has a history of Raynaud's, denies other autoimmune disorders. I have reviewed the patient's past medical history, family and social history, surgical history, medications  and allergies.  Past Medical History  Diagnosis Date  . Hyperlipidemia   . Asthma   . Hypertension   . DDD (degenerative disc disease), cervical   . Atypical pneumonia 1998    not hosp   Past Surgical History  Procedure Laterality Date  . Abdominal hysterectomy      supracervical- L ovary remains  . Cardiovascular stress test      08/28/06 normal    History   Social History  . Marital Status: Married    Spouse Name: N/A    Number of Children: N/A  . Years of Education: N/A   Occupational History  . [place] History Main Topics  . Smoking status: Former Smoker    Quit date: 07/29/1997  . Smokeless tobacco: Never Used  . Alcohol Use: Yes     Comment: occassionally  . Drug Use: No  . Sexual Activity: Not on file   Other Topics Concern  . Not on file   Social History Narrative   Kids live close by, 3 grandkids- really enjoys, runs for exercise, ran a [facility] March 2011   Current Outpatient Prescriptions on File Prior to Visit  Medication Sig Dispense Refill  . albuterol (VENTOLIN HFA) 108 (90 BASE) MCG/ACT inhaler Inhale 2 puffs into the lungs every 4 (four) hours as needed for wheezing. 2 puffs prior to exercise as needed.  1 Inhaler  3  .  amLODipine (NORVASC) 5 MG tablet Take 1 tablet (5 mg total) by mouth daily.  30 tablet  5  . aspirin 81 MG chewable tablet Chew 81 mg by mouth daily.        Marland Kitchen atorvastatin (LIPITOR) 20 MG tablet Take 1 tablet (20 mg total) by mouth daily.  30 tablet  11  . Calcium Carbonate-Vit D-Min 600-400 MG-UNIT TABS Take 1 tablet by mouth 2 (two) times daily.       . cholecalciferol (VITAMIN D) 400 UNITS TABS Take 400 Units by mouth daily.      . Omega-3 Fatty Acids (FISH OIL) 1000 MG CAPS Take 1 capsule by mouth 2 (two) times daily.         No current facility-administered medications on file prior to visit.   Allergies  Allergen Reactions  . Codeine     REACTION: passed out   Family History  Problem Relation Age of  Onset  . Lung cancer Father   . Hyperlipidemia Mother   . Diabetes Mother   . Hypertension Mother   . Stroke Mother   . Lung cancer      uncle  . Pancreatic cancer      unspecified family member  . Diabetes      aunt  . Coronary artery disease Maternal Grandfather   . Heart disease Other      ROS: Review of Systems: General:  Reports Recent weight change,Fatigue,  Recent increase in  appetite Eyes: Denies Vision Difficulty, Eye pain ENT: Denies Hearing difficulty, Difficulty Swallowing ( resolved now) CVS: Denies Chest pain, Palpitations/Irregular Heart beat, Shortness of breath lying flat, Swelling of legs Resp: Denies Frequent Cough, Shortness of Breath, Wheezing GI: Denies Heartburn, (Had an episode of Nausea, Vomiting, Diarrhea, O/w neg now) , denies Constipation, Abdominal Pain GU: Denies polyuria, nocturia Bones/joints: Denies Joint Pain, Bone pain; Reports Muscle aches Skin/Hair/Nails: Denies Rash, New stretch marks, Itching, Hair loss, Excessive hair growth Reproduction: Denies Low sexual desire, Women: Menstrual cycle problems, Women: Breast Discharge, Men: Difficulty with erections, Men: Enlarged Breasts CNS: Denies Frequent Headaches, Blurry vision, Tremors, Seizures, Loss of consciousness, Localized weakness Endocrine: Reports Excess thirst, Feeling excessively hot, Denies Feeling excessively cold Heme: Denies Easy bruising, Enlarged glands or lumps in neck Allergy: Denies Food allergies, Environmental allergies  PE: BP 110/70  Pulse 82  Temp(Src) 98.3 F (36.8 C) (Oral)  Ht 5' 4.5" (1.638 m)  Wt 162 lb 4 oz (73.596 kg)  BMI 27.43 kg/m2  SpO2 97%  LMP 07/30/2003 Wt Readings from Last 3 Encounters:  02/22/14 162 lb 4 oz (73.596 kg)  02/08/14 160 lb 4 oz (72.689 kg)  01/26/14 159 lb 8 oz (72.349 kg)    HEENT: /AT, EOMI, no icterus, no proptosis, no chemosis, no mild lid lag, no retraction, eyes close completely Neck: thyroid gland - smooth, non-tender,  no erythema, no tracheal deviation; negative Pemberton's sign; no lympahadenopathy; no bruits Lungs: good air entry, clear bilaterally Heart: S1&S2 normal, regular rate & rhythm; no murmurs, rubs or gallops Abd: soft, NT, ND, no HSM, +BS Ext: no tremor in hands bilaterally, no edema, 2+ DP/PT pulses, good muscle mass Neuro: normal gait, 2+ reflexes bilaterally, normal 5/5 strength, no proximal myopathy  Derm: no pretibial myxoedema/skin dryness   ASSESSMENT: 1. Low TSH  PLAN:  Discussed with the patient regarding thyroid hormone physiology, symptoms and signs of hyperthyroidism. Discussed possible etiologies for low TSH could indicate sick euthyroid syndrome vs thyroiditis vs early subclinical hyperthyroidism due to [facility]  disease/ toxic adenoma/ toxic MNG. If the changes were due to thyroiditis/sick euthyroid syndrome then they will be most likely transient.   Patient with a recently found low TSH, with recent symptoms of fatigue, increased appetite and weight gain.  On exam today she appears to be clinically euthyroid without a goiter/palpable nodules. her heart rate is well controlled today, will hold off on starting beta blocker for now.   -she had recent labs done 3 weeks ago. I will recheck her thyroid profile today to assess trend in her thyroid levels due to possibility of it being thyroiditis/sick euthyroid. - If the labs are stable as compared to prior labs, then could continue to monitor symptoms for now.  - She will follow up in 2 months and will repeat thyroid labs then. If labs show worsening of hyperthyroid status/continued abnormal thyroid labs, then would conduct further workup re: etiology. We discussed that workup will include an iodine uptake and scan to differentiate between possible etiologies. Will also consider a thyroid US to evaluate pending results of uptake and scan.  - we briefly discussed about possible modalities of treatment for the above conditions including  antithyroid medication ( most of the times methimazole), radioactive iodine ablation or surgery. Currently, patient will be monitored without any treatment.   - RTC in 2 months or sooner if symptoms worsen.    Shanekia Latella [facility] 02/22/2014  10:58 AM

## 2014-02-23 LAB — THYROID PROFILE II
Free Thyroxine Index: 2.4 (ref 1.2–4.9)
T3 TOTAL: 126 ng/dL (ref 71–180)
T3 Uptake Ratio: 30 % (ref 24–39)
T4 TOTAL: 7.9 ug/dL (ref 4.5–12.0)
TSH: 0.151 u[IU]/mL — ABNORMAL LOW (ref 0.450–4.500)

## 2014-04-26 ENCOUNTER — Ambulatory Visit (INDEPENDENT_AMBULATORY_CARE_PROVIDER_SITE_OTHER): Payer: BC Managed Care – PPO | Admitting: Endocrinology

## 2014-04-26 ENCOUNTER — Encounter: Payer: Self-pay | Admitting: Endocrinology

## 2014-04-26 VITALS — BP 130/66 | HR 82 | Ht 64.5 in | Wt 164.5 lb

## 2014-04-26 DIAGNOSIS — R7989 Other specified abnormal findings of blood chemistry: Secondary | ICD-10-CM

## 2014-04-26 LAB — T4, FREE: Free T4: 0.81 ng/dL (ref 0.60–1.60)

## 2014-04-26 LAB — T3, FREE: T3, Free: 3.1 pg/mL (ref 2.3–4.2)

## 2014-04-26 NOTE — Assessment & Plan Note (Addendum)
Discussed signs and symptoms of hyperthyroidism.  Last levels show mildly abnormal TSH levels, with normal thyroid levels consistent with most likely subclinical hyperthyroidism.   Discussed possible etiologies for low TSH could indicate sick euthyroid syndrome vs thyroiditis vs early subclinical hyperthyroidism due to Graves disease/ toxic adenoma/ toxic MNG. If the changes were due to thyroiditis/sick euthyroid syndrome then they will be most likely transient.   Update thyroid panel today with thyroid AB levels.   If the labs are stable as compared to prior labs, then could continue to monitor symptoms for now.   If labs show worsening of hyperthyroid status, then would conduct further workup re: etiology. We discussed that workup will include an iodine uptake and scan to differentiate between possible etiologies. Will also consider a thyroid US to evaluate pending results of uptake and scan.  Heart rate in normal range today.   RTC 4 months

## 2014-04-26 NOTE — Progress Notes (Signed)
Reason for visit: Low TSH HPI  Brittney Johnson is a 58 y.o.-year-old female, here for followup for low TSH.  Last seen here 2 months ago.   The patient denies any prior personal history of thyroid problems or any family history of thyroid disorders. she denies any personal XRT exposure to her neck area. Patient denies any recent hospitalization, or major change in his medical history. No recent medication changes except amlodipine. Denies tenderness in neck area.  Recently in early July, she had an episode of Sore throat for which she completed 1 week course of abx. Her sore throat resolved and after a few days, it returned when she felt " knot in her neck" when she was swallowing. This symptom resolved in July. She didn't see any lumps or tenderness in her thyroid area then.  No fever. Denies any recent ingestion of seaweed/kelp or recent exposure to iv contrast dye. No recent steroid use or use of herbal supplements.    I reviewed pt's thyroid tests, which continue to show mildly abnormal TSH: Lab Results  Component Value Date   TSH 0.151* 02/22/2014   TSH 0.15* 02/08/2014   TSH 0.14* 02/02/2014   TSH 2.07 03/02/2013   TSH 1.63 02/03/2012   TSH 1.80 05/31/2011   TSH 1.18 12/05/2010   FREET4 1.09 02/22/2014   FREET4 1.15 02/08/2014    Other pertinent labs are as follows:  Lab Results  Component Value Date   FREET4 1.09 02/22/2014   FREET4 1.15 02/08/2014   T3FREE 3.4 02/22/2014   T3FREE 3.3 02/08/2014   Lab Results  Component Value Date   T4TOTAL 7.9 02/22/2014    Lab Results  Component Value Date   T3TOTAL 126 02/22/2014    Lab Results  Component Value Date   AST 19 02/02/2014   ALT 19 02/02/2014    Review of systems:  [ x ] complains of   [  ] denies  [  ] weight loss [  ] tremors [  ] palpitations [  ] diarrhea [  ] increased appetite [  ] muscle weakness [ x ] heat intolerance, unchanged hot flashes [  ] fatigue  [  ] proptosis [  ] problems with eye closure or color vision. [  ]  noticing any enlargement in size of thyroid [  ] lumps in neck [  ] dysphagia  [  ] change in voice [  ] She does report a weight gain of approx 2 lbs since last visit.     I reviewed her chart and she also has a history of Raynaud's, denies other autoimmune disorders. I have reviewed the patient's past medical history, family history, medications and allergies.    Current Outpatient Prescriptions on File Prior to Visit  Medication Sig Dispense Refill  . albuterol (VENTOLIN HFA) 108 (90 BASE) MCG/ACT inhaler Inhale 2 puffs into the lungs every 4 (four) hours as needed for wheezing. 2 puffs prior to exercise as needed.  1 Inhaler  3  . amLODipine (NORVASC) 5 MG tablet Take 1 tablet (5 mg total) by mouth daily.  30 tablet  5  . aspirin 81 MG chewable tablet Chew 81 mg by mouth daily.        Marland Kitchen atorvastatin (LIPITOR) 20 MG tablet Take 1 tablet (20 mg total) by mouth daily.  30 tablet  11  . Calcium Carbonate-Vit D-Min 600-400 MG-UNIT TABS Take 1 tablet by mouth 2 (two) times daily.       Marland Kitchen  cholecalciferol (VITAMIN D) 400 UNITS TABS Take 400 Units by mouth daily.      . Omega-3 Fatty Acids (FISH OIL) 1000 MG CAPS Take 1 capsule by mouth 2 (two) times daily.         No current facility-administered medications on file prior to visit.   Allergies  Allergen Reactions  . Codeine     REACTION: passed out    PE: BP 130/66  Pulse 82  Ht 5' 4.5" (1.638 m)  Wt 164 lb 8 oz (74.617 kg)  BMI 27.81 kg/m2  SpO2 97%  LMP 07/30/2003 Wt Readings from Last 3 Encounters:  04/26/14 164 lb 8 oz (74.617 kg)  02/22/14 162 lb 4 oz (73.596 kg)  02/08/14 160 lb 4 oz (72.689 kg)   HEENT: [place]/AT, EOMI, no icterus, no proptosis, no chemosis, no mild lid lag, no retraction, eyes close completely Neck: thyroid gland - smooth, non-tender, no erythema, no tracheal deviation; negative Pemberton's sign; no lympahadenopathy; no bruits Lungs: good air entry, clear bilaterally Heart: S1&S2 normal, regular rate &  rhythm; no murmurs, rubs or gallops Ext: no tremor in hands bilaterally, no edema, 2+ DP/PT pulses, good muscle mass Neuro: normal gait, 2+ reflexes bilaterally, normal 5/5 strength, no proximal myopathy  Derm: no pretibial myxoedema/skin dryness    ASSESSMENT: 1. Low TSH  PLAN:  Problem List Items Addressed This Visit     Other   Abnormal TSH - Primary     Discussed signs and symptoms of hyperthyroidism.  Last levels show mildly abnormal TSH levels, with normal thyroid levels consistent with most likely subclinical hyperthyroidism.   Discussed possible etiologies for low TSH could indicate sick euthyroid syndrome vs thyroiditis vs early subclinical hyperthyroidism due to Graves disease/ toxic adenoma/ toxic MNG. If the changes were due to thyroiditis/sick euthyroid syndrome then they will be most likely transient.   Update thyroid panel today with thyroid AB levels.   If the labs are stable as compared to prior labs, then could continue to monitor symptoms for now.   If labs show worsening of hyperthyroid status, then would conduct further workup re: etiology. We discussed that workup will include an iodine uptake and scan to differentiate between possible etiologies. Will also consider a thyroid US to evaluate pending results of uptake and scan.  Heart rate in normal range today.   RTC 4 months    Relevant Orders      Thyroid Profile II      Thyroid stimulating immunoglobulin      T3, free      T4, free         - RTC in 4 months or sooner if symptoms worsen.    Jacque Garrels [facility] 04/26/2014  10:55 AM

## 2014-04-26 NOTE — Progress Notes (Signed)
Pre visit review using our clinic review tool, if applicable. No additional management support is needed unless otherwise documented below in the visit note. 

## 2014-04-26 NOTE — Patient Instructions (Signed)
Labs today.  If they shows worsening, then will order further workup.  If the levels are the same, then will continue to monitor.  Continue to monitor symptoms like weight loss, tremors, palpitations, diarrhea.   Please come back for a follow-up appointment in 4 months

## 2014-04-27 ENCOUNTER — Encounter: Payer: Self-pay | Admitting: *Deleted

## 2014-04-27 LAB — THYROID PROFILE II
Free Thyroxine Index: 2.3 (ref 1.2–4.9)
T3 UPTAKE RATIO: 30 % (ref 24–39)
T3, Total: 104 ng/dL (ref 71–180)
T4, Total: 7.5 ug/dL (ref 4.5–12.0)
TSH: 2.68 u[IU]/mL (ref 0.450–4.500)

## 2014-04-29 LAB — THYROID STIMULATING IMMUNOGLOBULIN: TSI: 60 % baseline (ref <140)

## 2014-05-02 ENCOUNTER — Encounter: Payer: Self-pay | Admitting: *Deleted

## 2014-05-10 ENCOUNTER — Telehealth: Payer: Self-pay | Admitting: Family Medicine

## 2014-05-10 NOTE — Telephone Encounter (Signed)
Pt called stating she didn't get her mammogram a pointment when she left after her cpx  No order in system   Does she need to get every year or every 2 years

## 2014-05-10 NOTE — Telephone Encounter (Signed)
Pt advise she doesn't need referral she can call and schedule appt herself

## 2014-05-11 ENCOUNTER — Ambulatory Visit (INDEPENDENT_AMBULATORY_CARE_PROVIDER_SITE_OTHER): Payer: BC Managed Care – PPO

## 2014-05-11 DIAGNOSIS — Z23 Encounter for immunization: Secondary | ICD-10-CM

## 2014-05-24 ENCOUNTER — Encounter: Payer: Self-pay | Admitting: Family Medicine

## 2014-05-24 ENCOUNTER — Ambulatory Visit (INDEPENDENT_AMBULATORY_CARE_PROVIDER_SITE_OTHER): Payer: BC Managed Care – PPO | Admitting: Family Medicine

## 2014-05-24 VITALS — BP 130/86 | HR 61 | Temp 98.1°F | Ht 64.5 in | Wt 163.5 lb

## 2014-05-24 DIAGNOSIS — M25511 Pain in right shoulder: Secondary | ICD-10-CM

## 2014-05-24 MED ORDER — MELOXICAM 15 MG PO TABS: 15.0000 mg | ORAL_TABLET | Freq: Every day | ORAL | Status: DC

## 2014-05-24 NOTE — Progress Notes (Signed)
Pre visit review using our clinic review tool, if applicable. No additional management support is needed unless otherwise documented below in the visit note. 

## 2014-05-24 NOTE — Assessment & Plan Note (Signed)
Suspect tendonitis Will replace advil with meloxicam 15 mg daily with food Ice and avoidance of heavy lifting appt with sport med Dr Lorelei Pont - an injection if appropriate may be helpful  I suspect that positional compensation could add to her scapular pain - can use heat and meloxicam as well  inst to call if symptoms worsen

## 2014-05-24 NOTE — Patient Instructions (Signed)
I think you have shoulder tendonitis and back strain  Stop advil and try meloxicam once daily with food  Use ice on shoulder any chance you can - for 10 minutes at a time  Make an appt with Dr Lorelei Pont on the way out to evaluate this further

## 2014-05-24 NOTE — Progress Notes (Signed)
Subjective:    Patient ID: Brittney Johnson, female    DOB: 11-15-1955, 58 y.o.   MRN: [id number]  HPI Here with R arm and shoulder pain  Been hurting for "a good while now"-- months  No injury, no new activity  Also pain around that shoulder blade   Hurts to lie on that side  Hurts to lie on back as well  It wakes her up at night   Area of pain is just below shoulder  Worse to abduct her arm   No n/t or change in strength No neck pain   She tried iburofen  - 400 mg as needed - helps just a little   Patient Active Problem List   Diagnosis Date Noted  . Abnormal TSH 02/08/2014  . Dupuytren's contracture of right hand 02/08/2014  . Low back strain 06/17/2013  . Stress reaction 03/09/2013  . Routine general medical examination at a health care facility 12/05/2010  . Other screening mammogram 12/05/2010  . Raynaud disease 12/05/2010  . HYPERLIPIDEMIA 06/29/2010  . LEUKOCYTOPENIA UNSPECIFIED 06/29/2010  . HYPERTENSION, BENIGN ESSENTIAL 06/29/2010   Past Medical History  Diagnosis Date  . Hyperlipidemia   . Asthma   . Hypertension   . DDD (degenerative disc disease), cervical   . Atypical pneumonia 1998    not hosp   Past Surgical History  Procedure Laterality Date  . Abdominal hysterectomy      supracervical- L ovary remains  . Cardiovascular stress test      08/28/06 normal    History  Substance Use Topics  . Smoking status: Former Smoker    Quit date: 07/29/1997  . Smokeless tobacco: Never Used  . Alcohol Use: Yes     Comment: occassionally   Family History  Problem Relation Age of Onset  . Lung cancer Father   . Hyperlipidemia Mother   . Diabetes Mother   . Hypertension Mother   . Stroke Mother   . Lung cancer      uncle  . Pancreatic cancer      unspecified family member  . Diabetes      aunt  . Coronary artery disease Maternal Grandfather   . Heart disease Other   . Thyroid disease Neg Hx    Allergies  Allergen Reactions  . Codeine    REACTION: passed out   Current Outpatient Prescriptions on File Prior to Visit  Medication Sig Dispense Refill  . albuterol (VENTOLIN HFA) 108 (90 BASE) MCG/ACT inhaler Inhale 2 puffs into the lungs every 4 (four) hours as needed for wheezing. 2 puffs prior to exercise as needed.  1 Inhaler  3  . amLODipine (NORVASC) 5 MG tablet Take 1 tablet (5 mg total) by mouth daily.  30 tablet  5  . aspirin 81 MG chewable tablet Chew 81 mg by mouth daily.        Marland Kitchen atorvastatin (LIPITOR) 20 MG tablet Take 1 tablet (20 mg total) by mouth daily.  30 tablet  11  . Calcium Carbonate-Vit D-Min 600-400 MG-UNIT TABS Take 1 tablet by mouth 2 (two) times daily.       . cholecalciferol (VITAMIN D) 400 UNITS TABS Take 400 Units by mouth daily.      . Omega-3 Fatty Acids (FISH OIL) 1000 MG CAPS Take 1 capsule by mouth 2 (two) times daily.         No current facility-administered medications on file prior to visit.     Review of Systems Review of  Systems  Constitutional: Negative for fever, appetite change, fatigue and unexpected weight change.  Eyes: Negative for pain and visual disturbance.  Respiratory: Negative for cough and shortness of breath.   Cardiovascular: Negative for cp or palpitations    Gastrointestinal: Negative for nausea, diarrhea and constipation.  Genitourinary: Negative for urgency and frequency.  Skin: Negative for pallor or rash   MSK pos for R shoulder and R scapular pain/neg for joint swelling  Neurological: Negative for weakness, light-headedness, numbness and headaches.  Hematological: Negative for adenopathy. Does not bruise/bleed easily.  Psychiatric/Behavioral: Negative for dysphoric mood. The patient is not nervous/anxious.         Objective:   Physical Exam  Constitutional: She appears well-developed and well-nourished. No distress.  HENT:  Head: Normocephalic and atraumatic.  Mouth/Throat: Oropharynx is clear and moist.  Eyes: Conjunctivae and EOM are normal. Pupils are  equal, round, and reactive to light. No scleral icterus.  Neck: Normal range of motion. Neck supple. No JVD present. Carotid bruit is not present. No thyromegaly present.  Cardiovascular: Normal rate, regular rhythm, normal heart sounds and intact distal pulses.  Exam reveals no gallop.   No murmur heard. Pulmonary/Chest: Effort normal and breath sounds normal. She has no rales.  Musculoskeletal: She exhibits tenderness. She exhibits no edema.       Right shoulder: She exhibits tenderness and spasm. She exhibits normal range of motion, no bony tenderness, no swelling, no effusion, no crepitus, no deformity, normal pulse and normal strength.  R shoulder  Mildly pos hawking and neer tests Mild acromion tenderness Can abduct fully w/o pain  Int rotation limited by pain   Tenderness in musculature medial to R scapula    Lymphadenopathy:    She has no cervical adenopathy.  Neurological: She is alert. She has normal strength and normal reflexes. She displays no atrophy. No cranial nerve deficit or sensory deficit. She exhibits normal muscle tone. Coordination normal.  Skin: Skin is warm and dry. No rash noted. No erythema. No pallor.  Psychiatric: She has a normal mood and affect.          Assessment & Plan:   Problem List Items Addressed This Visit     Other   Shoulder pain, right - Primary     Suspect tendonitis Will replace advil with meloxicam 15 mg daily with food Ice and avoidance of heavy lifting appt with sport med Dr Lorelei Pont - an injection if appropriate may be helpful  I suspect that positional compensation could add to her scapular pain - can use heat and meloxicam as well  inst to call if symptoms worsen

## 2014-05-30 ENCOUNTER — Encounter: Payer: Self-pay | Admitting: Family Medicine

## 2014-06-01 ENCOUNTER — Ambulatory Visit (INDEPENDENT_AMBULATORY_CARE_PROVIDER_SITE_OTHER): Payer: BC Managed Care – PPO | Admitting: Family Medicine

## 2014-06-01 ENCOUNTER — Encounter: Payer: Self-pay | Admitting: Family Medicine

## 2014-06-01 VITALS — BP 124/80 | HR 76 | Temp 98.9°F | Ht 64.5 in | Wt 164.2 lb

## 2014-06-01 DIAGNOSIS — M7541 Impingement syndrome of right shoulder: Secondary | ICD-10-CM

## 2014-06-01 MED ORDER — MELOXICAM 15 MG PO TABS: 15.0000 mg | ORAL_TABLET | Freq: Every day | ORAL | Status: DC

## 2014-06-01 NOTE — Progress Notes (Signed)
Dr. Frederico Hamman T. Evelean Bigler, MD, [facility] [place], [address] Phone: [phone] Fax: [phone]  06/01/2014  Patient: Brittney Johnson, MRN: [id number], DOB: [birth year], 58 y.o.  Primary Physician:  Loura Pardon, MD  Chief Complaint: Shoulder Pain  Subjective:   This 58 y.o. female patient noted above presents with shoulder pain that has been ongoing for 2 months. there is no history of trauma or accident recently The patient denies neck pain or radicular symptoms. Denies dislocation, subluxation, separation of the shoulder. The patient does complain of pain in the overhead plane with significant painful arc of motion.  R shoulder: and between shoulder blades, and a lot better. Doesn't hurt as much since the patient started taking meloxicam.  Medications Tried: Tylenol, NSAIDS Ice or Heat: minimally helpful Tried PT: No  Prior shoulder Injury: No Prior surgery: No Prior fracture: No  The PMH, PSH, Social History, Family History, Medications, and allergies have been reviewed in [facility], and have been updated if relevant.  GEN: No fevers, chills. Nontoxic. Primarily MSK c/o today. MSK: Detailed in the HPI GI: tolerating PO intake without difficulty Neuro: No numbness, parasthesias, or tingling associated. Otherwise the pertinent positives of the ROS are noted above.   Objective:   Blood pressure 124/80, pulse 76, temperature 98.9 F (37.2 C), temperature source Oral, height 5' 4.5" (1.638 m), weight 164 lb 4 oz (74.503 kg), last menstrual period 07/30/2003.  GEN: Well-developed,well-nourished,in no acute distress; alert,appropriate and cooperative throughout examination HEENT: Normocephalic and atraumatic without obvious abnormalities. Ears, externally no deformities PULM: Breathing comfortably in no respiratory distress EXT: No clubbing, cyanosis, or edema PSYCH: Normally interactive. Cooperative during the  interview. Pleasant. Friendly and conversant. Not anxious or depressed appearing. Normal, full affect.  Shoulder: R Inspection: No muscle wasting or winging Ecchymosis/edema: neg  AC joint, scapula, clavicle: mild AC joint pain Cervical spine: NT, full ROM Spurling's: neg Abduction: full, 5/5 Flexion: full, 5/5 IR, full, lift-off: 5/5 ER at neutral: full, 5/5 AC crossover: neg Neer: pos Hawkins: pos Drop Test: neg Empty Can: pos Supraspinatus insertion: mild-mod T Bicipital groove: NT Speed's: neg Yergason's: neg Sulcus sign: neg Scapular dyskinesis: none C5-T1 intact  Neuro: Sensation intact Grip 5/5   Radiology: No results found.  Assessment and Plan:    Shoulder impingement, right - Plan: Ambulatory referral to Physical Therapy  >25 minutes spent in face to face time with patient, >50% spent in counselling or coordination of care   Classic history and examination with a painful ARC of motion and reproducible exam.  Rotator cuff strengthening and scapular stabilization exercises were reviewed with the patient.  MOON shoulder protocol given to the patient. Retraining shoulder mechanics and function was emphasized to the patient with rehab done at least 5-6 days a week.  The patient could benefit from formal PT to assist with scapular stabilization and RTC strengthening.   Follow-up:  In 6 weeks if she is not improved. At that point if she isnot making significant progress over take about injecting her subacromial space  New Prescriptions   No medications on file   Orders Placed This Encounter  Procedures  . Ambulatory referral to Physical Therapy    Signed,  Frederico Hamman T. Madden Piazza, MD   Patient's Medications  New Prescriptions   No medications on file  Previous Medications   ALBUTEROL (VENTOLIN HFA) 108 (90 BASE) MCG/ACT INHALER    Inhale 2 puffs into the lungs every 4 (four) hours  as needed for wheezing. 2 puffs prior to exercise as needed.   AMLODIPINE  (NORVASC) 5 MG TABLET    Take 1 tablet (5 mg total) by mouth daily.   ASPIRIN 81 MG CHEWABLE TABLET    Chew 81 mg by mouth daily.     ATORVASTATIN (LIPITOR) 20 MG TABLET    Take 1 tablet (20 mg total) by mouth daily.   CALCIUM CARBONATE-VIT D-MIN 600-400 MG-UNIT TABS    Take 1 tablet by mouth 2 (two) times daily.    CHOLECALCIFEROL (VITAMIN D) 400 UNITS TABS    Take 400 Units by mouth daily.   OMEGA-3 FATTY ACIDS (FISH OIL) 1000 MG CAPS    Take 1 capsule by mouth 2 (two) times daily.    Modified Medications   Modified Medication Previous Medication   MELOXICAM (MOBIC) 15 MG TABLET meloxicam (MOBIC) 15 MG tablet      Take 1 tablet (15 mg total) by mouth daily. Take with food    Take 1 tablet (15 mg total) by mouth daily. Take with food  Discontinued Medications   No medications on file

## 2014-06-01 NOTE — Patient Instructions (Signed)

## 2014-06-01 NOTE — Progress Notes (Signed)
Pre visit review using our clinic review tool, if applicable. No additional management support is needed unless otherwise documented below in the visit note. 

## 2014-06-06 ENCOUNTER — Telehealth: Payer: Self-pay | Admitting: Family Medicine

## 2014-06-06 DIAGNOSIS — Z8619 Personal history of other infectious and parasitic diseases: Secondary | ICD-10-CM | POA: Insufficient documentation

## 2014-06-06 MED ORDER — ACYCLOVIR 5 % EX OINT: 1.0000 "application " | TOPICAL_OINTMENT | CUTANEOUS | Status: DC

## 2014-06-06 NOTE — Telephone Encounter (Signed)
Pt notified Rx sent 

## 2014-06-06 NOTE — Telephone Encounter (Signed)
Pt called requesting something be called in for a fever blister. I informed pt that she would probably need to be seen to get any type of abx or cream. She did not want to make an appt. Please advise.  [facility] in [place]

## 2014-06-06 NOTE — Telephone Encounter (Signed)
I can px some zovirax ointment -this may help it get better faster  Will refill electronically

## 2014-06-09 ENCOUNTER — Ambulatory Visit: Payer: Self-pay | Admitting: Family Medicine

## 2014-06-10 ENCOUNTER — Encounter: Payer: Self-pay | Admitting: Family Medicine

## 2014-07-15 ENCOUNTER — Telehealth: Payer: Self-pay

## 2014-07-15 ENCOUNTER — Emergency Department: Payer: Self-pay | Admitting: Emergency Medicine

## 2014-07-15 LAB — CBC
HCT: 38.4 % (ref 35.0–47.0)
HGB: 12.4 g/dL (ref 12.0–16.0)
MCH: 28.7 pg (ref 26.0–34.0)
MCHC: 32.3 g/dL (ref 32.0–36.0)
MCV: 89 fL (ref 80–100)
PLATELETS: 209 10*3/uL (ref 150–440)
RBC: 4.31 10*6/uL (ref 3.80–5.20)
RDW: 13.7 % (ref 11.5–14.5)
WBC: 5.6 10*3/uL (ref 3.6–11.0)

## 2014-07-15 LAB — COMPREHENSIVE METABOLIC PANEL
ALBUMIN: 3.7 g/dL (ref 3.4–5.0)
ALT: 24 U/L
ANION GAP: 9 (ref 7–16)
AST: 26 U/L (ref 15–37)
Alkaline Phosphatase: 64 U/L
BUN: 15 mg/dL (ref 7–18)
Bilirubin,Total: 0.2 mg/dL (ref 0.2–1.0)
CALCIUM: 9.1 mg/dL (ref 8.5–10.1)
CREATININE: 0.87 mg/dL (ref 0.60–1.30)
Chloride: 106 mmol/L (ref 98–107)
Co2: 25 mmol/L (ref 21–32)
EGFR (African American): >60
EGFR (Non-African Amer.): >60
Glucose: 130 mg/dL — ABNORMAL HIGH (ref 65–99)
OSMOLALITY: 282 (ref 275–301)
Potassium: 3.5 mmol/L (ref 3.5–5.1)
Sodium: 140 mmol/L (ref 136–145)
TOTAL PROTEIN: 7.7 g/dL (ref 6.4–8.2)

## 2014-07-15 LAB — TROPONIN I: Troponin-I: <0.02 ng/mL

## 2014-07-15 NOTE — Telephone Encounter (Signed)
Spoke to pt and pt is at [facility] now

## 2014-07-15 NOTE — Telephone Encounter (Signed)
PLEASE NOTE: All timestamps contained within this report are represented as [place] Standard Time. CONFIDENTIALTY NOTICE: This fax transmission is intended only for the addressee. It contains information that is legally privileged, confidential or otherwise protected from use or disclosure. If you are not the intended recipient, you are strictly prohibited from reviewing, disclosing, copying using or disseminating any of this information or taking any action in reliance on or regarding this information. If you have received this fax in error, please notify us immediately by telephone so that we can arrange for its return to [place]. Phone: [phone], Toll-Free: [phone], Fax: [phone] Page: 1 of 2 Call Id: [id number] Eitzen Patient Name: Brittney Johnson Gender: Female DOB: 04/26/1956 Age: 58 Y 73 M 23 D Return Phone Number: [phone] (Primary) Address: City/State/Zip: [place] Client [place] [facility] - Client Client Site [place], [place] Contact Type Call Call Type Triage / Clinical Caller Name [place] Relationship To Patient Self Return Phone Number [phone] (Primary) Chief Complaint VISION - sudden loss or sudden decrease (NOT blurred vision) Initial Comment Caller states she got to work, and was talking to a co-worker and everything went black. Vision was black, saw spots. Dizzy. PreDisposition Did not know what to do Nurse Assessment Nurse: Loletta Specter, RN, Wells Guiles Date/Time Eilene Ghazi Time): 07/15/2014 8:45:53 AM Confirm and document reason for call. If symptomatic, describe symptoms. ---Caller states she got to work, and was talking to a co-worker and everything went black. Vision was black, saw spots. Dizzy. Denies LOC. Has the patient traveled out of the country within the last 30 days? ---Not Applicable Does the  patient require triage? ---Yes Related visit to physician within the last 2 weeks? ---No Does the PT have any chronic conditions? (i.e. diabetes, asthma, etc.) ---Yes List chronic conditions. ---HTN. Guidelines Guideline Title Affirmed Question Affirmed Notes Nurse Date/Time (Eastern Time) Dizziness - Lightheadedness SEVERE dizziness (e.g., unable to stand, requires support to walk, feels like passing out now) Loletta Specter, RN, Wells Guiles 07/15/2014 8:49:32 AM Disp. Time Eilene Ghazi Time) Disposition Final User 07/15/2014 8:44:55 AM Send to Urgent Queue Baruch Goldmann 07/15/2014 8:53:47 AM Go to ED Now (or PCP triage) Yes Loletta Specter, RN, Romualdo Bolk Understands: Yes PLEASE NOTE: All timestamps contained within this report are represented as [place] Standard Time. CONFIDENTIALTY NOTICE: This fax transmission is intended only for the addressee. It contains information that is legally privileged, confidential or otherwise protected from use or disclosure. If you are not the intended recipient, you are strictly prohibited from reviewing, disclosing, copying using or disseminating any of this information or taking any action in reliance on or regarding this information. If you have received this fax in error, please notify us immediately by telephone so that we can arrange for its return to [place]. Phone: [phone], Toll-Free: [phone], Fax: [phone] Page: 2 of 2 Call Id: [id number] Disagree/Comply: Comply Care Advice Given Per Guideline GO TO ED NOW (OR PCP TRIAGE): * IF NO PCP TRIAGE: You need to be seen. Go to the [facility] at _[facility] within the next hour. Leave as soon as you can. DRIVING: Another adult should drive. Warm transferred to office. After Care Instructions Given Call Event Type User Date / Time Description Referrals GO TO FACILITY UNDECIDED

## 2014-07-15 NOTE — Telephone Encounter (Signed)
I agree with adv to go to ER or UC

## 2014-07-29 HISTORY — PX: OTHER SURGICAL HISTORY: SHX169

## 2014-08-05 ENCOUNTER — Ambulatory Visit (INDEPENDENT_AMBULATORY_CARE_PROVIDER_SITE_OTHER): Payer: BLUE CROSS/BLUE SHIELD | Admitting: Cardiovascular Disease

## 2014-08-05 ENCOUNTER — Encounter: Payer: Self-pay | Admitting: Cardiovascular Disease

## 2014-08-05 VITALS — BP 144/88 | HR 88 | Ht 64.0 in | Wt 164.0 lb

## 2014-08-05 DIAGNOSIS — R9431 Abnormal electrocardiogram [ECG] [EKG]: Secondary | ICD-10-CM | POA: Insufficient documentation

## 2014-08-05 DIAGNOSIS — F419 Anxiety disorder, unspecified: Secondary | ICD-10-CM | POA: Insufficient documentation

## 2014-08-05 DIAGNOSIS — R55 Syncope and collapse: Secondary | ICD-10-CM | POA: Insufficient documentation

## 2014-08-05 NOTE — Patient Instructions (Signed)
Your physician has requested that you have a stress echocardiogram. For further information please visit [email or web address]. Please follow instruction sheet as given. - eat a lite breakfast  - take your medications  - wear comfortable cloths and walking shoes  - no lotion on your skin   Your physician recommends that you schedule a follow-up appointment in:  As needed

## 2014-08-05 NOTE — Assessment & Plan Note (Signed)
The history is not suggestive of arrhythmia. She is not orthostatic today. These episodes have been happening in the setting of significant stress and anxiety at work which might be contributing. Structural heart abnormalities to be excluded with echocardiogram during stress testing.

## 2014-08-05 NOTE — Assessment & Plan Note (Signed)
She does have an abnormal EKG with nonspecific global ST depression. Thus, I recommend evaluation with a stress echocardiogram. Symptoms include exertional dyspnea without chest pain.

## 2014-08-05 NOTE — Assessment & Plan Note (Signed)
This might be contributing to her symptoms. I advised her to follow-up with Dr. Glori Bickers for management of this.

## 2014-08-05 NOTE — Progress Notes (Signed)
Primary care physician: Dr. Glori Bickers  HPI  This is a pleasant 59 year old female who was referred for evaluation of presyncope. She has no previous cardiac history. She has known history of hypertension since she was 59 years old, hyperlipidemia and Raynaud's disease. She has been under significant stress lately at home. She had a recent episode while she was sitting behind her desk where she felt dizzy followed by black vision but no loss of consciousness. She was anxious with slight palpitations but no chest pain or shortness of breath. He was taken to the emergency room. Her labs were unremarkable. ECG showed sinus rhythm with inferior T wave changes. Since then, she had recurrent episodes of dizziness but not as intense happening in the same circumstances. She complains of exertional dyspnea with no chest discomfort. No orthopnea, PND or lower extremity edema. There is no family history of premature coronary artery disease or sudden death. She is not a smoker.  Allergies  Allergen Reactions  . Codeine     REACTION: passed out     Current Outpatient Prescriptions on File Prior to Visit  Medication Sig Dispense Refill  . albuterol (VENTOLIN HFA) 108 (90 BASE) MCG/ACT inhaler Inhale 2 puffs into the lungs every 4 (four) hours as needed for wheezing. 2 puffs prior to exercise as needed. 1 Inhaler 3  . amLODipine (NORVASC) 5 MG tablet Take 1 tablet (5 mg total) by mouth daily. 30 tablet 5  . aspirin 81 MG chewable tablet Chew 81 mg by mouth daily.      Marland Kitchen atorvastatin (LIPITOR) 20 MG tablet Take 1 tablet (20 mg total) by mouth daily. 30 tablet 11  . Calcium Carbonate-Vit D-Min 600-400 MG-UNIT TABS Take 1 tablet by mouth 2 (two) times daily.     . meloxicam (MOBIC) 15 MG tablet Take 1 tablet (15 mg total) by mouth daily. Take with food (Patient taking differently: Take 15 mg by mouth as needed. Take with food) 30 tablet 3  . Omega-3 Fatty Acids (FISH OIL) 1000 MG CAPS Take 1 capsule by mouth 2 (two)  times daily.       No current facility-administered medications on file prior to visit.     Past Medical History  Diagnosis Date  . Hyperlipidemia   . Asthma   . Hypertension   . DDD (degenerative disc disease), cervical   . Atypical pneumonia 1998    not hosp  . Syncope and collapse      Past Surgical History  Procedure Laterality Date  . Abdominal hysterectomy      supracervical- L ovary remains  . Cardiovascular stress test      08/28/06 normal   . Tubal ligation       Family History  Problem Relation Age of Onset  . Lung cancer Father   . Hyperlipidemia Mother   . Diabetes Mother   . Hypertension Mother   . Stroke Mother   . Lung cancer      uncle  . Pancreatic cancer      unspecified family member  . Diabetes      aunt  . Coronary artery disease Maternal Grandfather   . Heart disease Other   . Thyroid disease Neg Hx      History   Social History  . Marital Status: Married    Spouse Name: N/A    Number of Children: N/A  . Years of Education: N/A   Occupational History  . [place] History Main Topics  .  Smoking status: Former Smoker    Quit date: 07/29/1997  . Smokeless tobacco: Never Used  . Alcohol Use: Yes     Comment: occassionally  . Drug Use: No  . Sexual Activity: Not on file   Other Topics Concern  . Not on file   Social History Narrative   Kids live close by, 3 grandkids- really enjoys, runs for exercise, ran a [facility] March 2011     ROS A 10 point review of system was performed. It is negative other than that mentioned in the history of present illness.   PHYSICAL EXAM   BP 144/88 mmHg  Pulse 88  Ht 5\' 4"  (1.626 m)  Wt 164 lb (74.39 kg)  BMI 28.14 kg/m2  LMP 07/30/2003 Constitutional: She is oriented to person, place, and time. She appears well-developed and well-nourished. No distress.  HENT: No nasal discharge.  Head: Normocephalic and atraumatic.  Eyes: Pupils are equal and round. No discharge.    Neck: Normal range of motion. Neck supple. No JVD present. No thyromegaly present.  Cardiovascular: Normal rate, regular rhythm, normal heart sounds. Exam reveals no gallop and no friction rub. No murmur heard.  Pulmonary/Chest: Effort normal and breath sounds normal. No stridor. No respiratory distress. She has no wheezes. She has no rales. She exhibits no tenderness.  Abdominal: Soft. Bowel sounds are normal. She exhibits no distension. There is no tenderness. There is no rebound and no guarding.  Musculoskeletal: Normal range of motion. She exhibits no edema and no tenderness.  Neurological: She is alert and oriented to person, place, and time. Coordination normal.  Skin: Skin is warm and dry. No rash noted. She is not diaphoretic. No erythema. No pallor.  Psychiatric: She has a normal mood and affect. Her behavior is normal. Judgment and thought content normal.     TFT:DDUKG  Rhythm  -Diffuse ST depression  -Nondiagnostic -possible digitalis effect, -consider subendocardial injury/ischemia.   ABNORMAL    ASSESSMENT AND PLAN

## 2014-08-18 ENCOUNTER — Other Ambulatory Visit: Payer: BLUE CROSS/BLUE SHIELD

## 2014-08-22 ENCOUNTER — Telehealth: Payer: Self-pay | Admitting: *Deleted

## 2014-08-22 NOTE — Telephone Encounter (Signed)
Reviewed stress echo instructions with patient  

## 2014-08-23 ENCOUNTER — Other Ambulatory Visit (INDEPENDENT_AMBULATORY_CARE_PROVIDER_SITE_OTHER): Payer: BLUE CROSS/BLUE SHIELD

## 2014-08-23 DIAGNOSIS — R9431 Abnormal electrocardiogram [ECG] [EKG]: Secondary | ICD-10-CM

## 2014-08-23 DIAGNOSIS — R55 Syncope and collapse: Secondary | ICD-10-CM

## 2014-08-29 ENCOUNTER — Telehealth: Payer: Self-pay | Admitting: Cardiovascular Disease

## 2014-08-29 NOTE — Telephone Encounter (Signed)
See result note.  

## 2014-08-29 NOTE — Telephone Encounter (Signed)
Pt calling about her echo. She was just waiting on the results.   Please call patient when ready. Pt is aware she will be getting a call when they are done.

## 2014-08-30 ENCOUNTER — Ambulatory Visit (INDEPENDENT_AMBULATORY_CARE_PROVIDER_SITE_OTHER): Payer: BLUE CROSS/BLUE SHIELD | Admitting: Endocrinology

## 2014-08-30 ENCOUNTER — Encounter: Payer: Self-pay | Admitting: Endocrinology

## 2014-08-30 VITALS — BP 138/76 | HR 78 | Ht 64.5 in | Wt 164.5 lb

## 2014-08-30 DIAGNOSIS — R7989 Other specified abnormal findings of blood chemistry: Secondary | ICD-10-CM

## 2014-08-30 LAB — T3, FREE: T3 FREE: 2.8 pg/mL (ref 2.3–4.2)

## 2014-08-30 LAB — T4, FREE: Free T4: 0.84 ng/dL (ref 0.60–1.60)

## 2014-08-30 NOTE — Assessment & Plan Note (Signed)
Discussed signs and symptoms of hyperthyroidism at prior visits.  Last levels on thyroid panel are normal.  Not sure whether recent episodes are related to stress or hyperthyroidism.  Heart rate well controlled today. She appears clinically euthyroid without any palpable nodules/goiter. Prior abnormal thyroid labs could be related to prior episode of sore throat.     Update thyroid panel today.   If the labs are stable as compared to prior labs, then could continue to monitor symptoms for now.  She will also have her labs drawn for thyroid profile II, free t3, freeT4 at time of her annual exam this July.  In the interim monitor symptoms, and RTC 1 year

## 2014-08-30 NOTE — Progress Notes (Signed)
Reason for visit: Low TSH HPI  Brittney Johnson is a 59 y.o.-year-old female, here for followup for low TSH. Most recent labs normal Sept 2015.  Last seen here 4 months ago.   * Since past visit, she had episode of presyncope around Christmas, for which recent cards workup that was negative. Has been having episodes that are milder since then [facility], feeling flushed, dizziness, palps). Wonders whether this could be stress related.  The patient denies any prior personal history of thyroid problems or any family history of thyroid disorders. she denies any personal XRT exposure to her neck area. Patient denies any recent hospitalization, or major change in his medical history. No recent medication changes. Denies tenderness in neck area.  In early July 2015, she had an episode of Sore throat for which she completed 1 week course of abx. Her sore throat resolved and after a few days, it returned when she felt " knot in her neck" when she was swallowing. This symptom resolved in July. She didn't see any lumps or tenderness in her thyroid area then.  No fever. Denies any recent ingestion of seaweed/kelp or recent exposure to iv contrast dye. No recent steroid use or use of herbal supplements.    I reviewed pt's thyroid tests, which continue to show mildly abnormal TSH, but last check were normal: Lab Results  Component Value Date   TSH 2.680 04/26/2014   TSH 0.151* 02/22/2014   TSH 0.15* 02/08/2014   TSH 0.14* 02/02/2014   TSH 2.07 03/02/2013   TSH 1.63 02/03/2012   TSH 1.80 05/31/2011   TSH 1.18 12/05/2010   FREET4 0.81 04/26/2014   FREET4 1.09 02/22/2014   FREET4 1.15 02/08/2014    Other pertinent labs are as follows:  Lab Results  Component Value Date   FREET4 0.81 04/26/2014   FREET4 1.09 02/22/2014   FREET4 1.15 02/08/2014   T3FREE 3.1 04/26/2014   T3FREE 3.4 02/22/2014   T3FREE 3.3 02/08/2014   Lab Results  Component Value Date   T4TOTAL 7.5 04/26/2014   T4TOTAL 7.9  02/22/2014    Lab Results  Component Value Date   T3TOTAL 104 04/26/2014   T3TOTAL 126 02/22/2014    Lab Results  Component Value Date   AST 19 02/02/2014   ALT 19 02/02/2014    Review of systems:  [ x ] complains of   [  ] denies  [ x ] weight gain [ x ] tremors-occ [ x ] palpitations-occ [  ] diarrhea [  ] increased appetite [  ] muscle weakness [ x ] heat intolerance, unchanged hot flashes [  ] fatigue [ x] anxiety [  ] proptosis [  ] problems with eye closure or color vision. [  ] noticing any enlargement in size of thyroid [  ] lumps in neck [  ] dysphagia  [  ] change in voice   I reviewed her chart and she also has a history of Raynaud's, denies other autoimmune disorders. I have reviewed the patient's past medical history,  medications and allergies.    Current Outpatient Prescriptions on File Prior to Visit  Medication Sig Dispense Refill  . albuterol (VENTOLIN HFA) 108 (90 BASE) MCG/ACT inhaler Inhale 2 puffs into the lungs every 4 (four) hours as needed for wheezing. 2 puffs prior to exercise as needed. 1 Inhaler 3  . amLODipine (NORVASC) 5 MG tablet Take 1 tablet (5 mg total) by mouth daily. 30 tablet 5  . aspirin  81 MG chewable tablet Chew 81 mg by mouth daily.      Marland Kitchen atorvastatin (LIPITOR) 20 MG tablet Take 1 tablet (20 mg total) by mouth daily. 30 tablet 11  . Calcium Carbonate-Vit D-Min 600-400 MG-UNIT TABS Take 1 tablet by mouth 2 (two) times daily.     . meloxicam (MOBIC) 15 MG tablet Take 1 tablet (15 mg total) by mouth daily. Take with food (Patient taking differently: Take 15 mg by mouth as needed. Take with food) 30 tablet 3  . Multiple Vitamin (MULTIVITAMIN) tablet Take 1 tablet by mouth daily.    . Omega-3 Fatty Acids (FISH OIL) 1000 MG CAPS Take 1 capsule by mouth 2 (two) times daily.       No current facility-administered medications on file prior to visit.   Allergies  Allergen Reactions  . Codeine     REACTION: passed out    PE: BP 138/76 mmHg   Pulse 78  Ht 5' 4.5" (1.638 m)  Wt 164 lb 8 oz (74.617 kg)  BMI 27.81 kg/m2  SpO2 96%  LMP 07/30/2003 Wt Readings from Last 3 Encounters:  08/30/14 164 lb 8 oz (74.617 kg)  08/05/14 164 lb (74.39 kg)  06/01/14 164 lb 4 oz (74.503 kg)   HEENT: [place]/AT, EOMI, no icterus, no proptosis, no chemosis, no mild lid lag, no retraction, eyes close completely Neck: thyroid gland - smooth, non-tender, no erythema, no tracheal deviation; negative Pemberton's sign; no lympahadenopathy; no bruits Lungs: good air entry, clear bilaterally Heart: S1&S2 normal, regular rate & rhythm; no murmurs, rubs or gallops Ext: no tremor in hands bilaterally, no edema, 2+ DP/PT pulses, good muscle mass Neuro: normal gait, 2+ reflexes bilaterally, normal 5/5 strength, no proximal myopathy  Derm: no pretibial myxoedema/skin dryness    ASSESSMENT: 1. Low TSH  PLAN:  Problem List Items Addressed This Visit      Other   Abnormal TSH - Primary    Discussed signs and symptoms of hyperthyroidism at prior visits.  Last levels on thyroid panel are normal.  Not sure whether recent episodes are related to stress or hyperthyroidism.  Heart rate well controlled today. She appears clinically euthyroid without any palpable nodules/goiter. Prior abnormal thyroid labs could be related to prior episode of sore throat.     Update thyroid panel today.   If the labs are stable as compared to prior labs, then could continue to monitor symptoms for now.  She will also have her labs drawn for thyroid profile II, free t3, freeT4 at time of her annual exam this July.  In the interim monitor symptoms, and RTC 1 year          Relevant Orders   T4, free   T3, free   Thyroid Profile II         - RTC in 1 year or sooner if symptoms worsen. She will get interim labs in July 2016 at her annual appointment.    Kaeden Mester [facility] 08/30/2014  10:39 AM

## 2014-08-30 NOTE — Progress Notes (Signed)
Pre visit review using our clinic review tool, if applicable. No additional management support is needed unless otherwise documented below in the visit note. 

## 2014-08-30 NOTE — Patient Instructions (Signed)
Monitor symptoms of hypo- and hyper-thyroidism.   Labs today.  Repeat thyroid labs in 6 months at annual exam.   Please return in 1 year.

## 2014-08-31 LAB — THYROID PROFILE II
Free Thyroxine Index: 2.4 (ref 1.2–4.9)
T3 Uptake Ratio: 31 % (ref 24–39)
T3, Total: 98 ng/dL (ref 71–180)
T4, Total: 7.6 ug/dL (ref 4.5–12.0)
TSH: 2.19 u[IU]/mL (ref 0.450–4.500)

## 2014-09-13 ENCOUNTER — Other Ambulatory Visit: Payer: Self-pay | Admitting: Family Medicine

## 2014-09-26 ENCOUNTER — Other Ambulatory Visit: Payer: Self-pay | Admitting: *Deleted

## 2014-09-26 MED ORDER — MELOXICAM 15 MG PO TABS: 15.0000 mg | ORAL_TABLET | Freq: Every day | ORAL | Status: DC

## 2014-09-26 MED ORDER — AMLODIPINE BESYLATE 5 MG PO TABS: 5.0000 mg | ORAL_TABLET | Freq: Every day | ORAL | Status: DC

## 2014-09-26 MED ORDER — ATORVASTATIN CALCIUM 20 MG PO TABS: 20.0000 mg | ORAL_TABLET | Freq: Every day | ORAL | Status: DC

## 2014-09-26 NOTE — Telephone Encounter (Signed)
Please schedule PE after mid July and fill all until that  Thanks

## 2014-09-26 NOTE — Telephone Encounter (Signed)
Called patient. PE is schedule on 02/20/15. [facility]

## 2014-09-26 NOTE — Telephone Encounter (Signed)
Received faxed for refill request from [facility].  Last prescribed  amlopipine on 09/13/14 at [facility]  meloxicam on 06/01/14  atorvastatin on 02/08/14  Last seen for acute on 06/01/14. No future appt.

## 2014-09-27 ENCOUNTER — Telehealth: Payer: Self-pay

## 2014-09-27 MED ORDER — MELOXICAM 15 MG PO TABS: 15.0000 mg | ORAL_TABLET | Freq: Every day | ORAL | Status: DC

## 2014-09-27 MED ORDER — ATORVASTATIN CALCIUM 20 MG PO TABS: 20.0000 mg | ORAL_TABLET | Freq: Every day | ORAL | Status: DC

## 2014-09-27 MED ORDER — AMLODIPINE BESYLATE 5 MG PO TABS: 5.0000 mg | ORAL_TABLET | Freq: Every day | ORAL | Status: DC

## 2014-09-27 NOTE — Telephone Encounter (Signed)
I could refer her to counseling -that may be helpful (unless she already has a counselor)- let me know if she wants that and if not just f/u if symptoms worsen

## 2014-09-27 NOTE — Telephone Encounter (Signed)
Pt spoke with Dr Fletcher Anon 08/05/2014 about anxiety issue and pt was advised to f/u with Dr Glori Bickers about management of anxiety; pt wants to know what Dr Glori Bickers would advise; pt did not want to schedule appt at this time.[facility] about not sending the 30 day rx and pt request 90 day rx amlodipine,meloxicam and [facility]vastatin [facility]. Advised done. See 09/26/14 phone note.

## 2014-09-27 NOTE — Telephone Encounter (Signed)
I need to talk to her in detail about symptoms so we can make a treatment plan (re: lot of options and a lot of potential side effects) - needs to schedule a visit please  Thanks

## 2014-09-27 NOTE — Telephone Encounter (Signed)
Called and notified patient of Dr Marliss Coots comments and to schedule a visit. However, patient does not want to schedule a visit at this time. She stated that she wants to figure it out on her own for right now without medication but would like Dr Marliss Coots suggestion of what to do in the mean time. She does have a CPE schedule on 02/20/15.

## 2014-09-29 NOTE — Telephone Encounter (Signed)
Left voicemail for patient to call back on 09/28/14.

## 2014-09-29 NOTE — Telephone Encounter (Signed)
Called patient and inform her of Dr Marliss Coots comments. Patient have a counseling service through work she could use. She will try that for now until further discussing the issue at appt in 02/20/15.

## 2015-02-12 ENCOUNTER — Telehealth: Payer: Self-pay | Admitting: Family Medicine

## 2015-02-12 DIAGNOSIS — Z0000 Encounter for general adult medical examination without abnormal findings: Secondary | ICD-10-CM

## 2015-02-12 DIAGNOSIS — R7989 Other specified abnormal findings of blood chemistry: Secondary | ICD-10-CM

## 2015-02-12 NOTE — Telephone Encounter (Signed)
Future labs for pt PE on 7/25- heads up  Dr Marthenia Rolling wanted me to add a thyroid profile and forward her the results

## 2015-02-20 ENCOUNTER — Encounter: Payer: Self-pay | Admitting: Family Medicine

## 2015-02-20 ENCOUNTER — Ambulatory Visit (INDEPENDENT_AMBULATORY_CARE_PROVIDER_SITE_OTHER): Payer: BLUE CROSS/BLUE SHIELD | Admitting: Family Medicine

## 2015-02-20 ENCOUNTER — Other Ambulatory Visit (HOSPITAL_COMMUNITY)
Admission: RE | Admit: 2015-02-20 | Discharge: 2015-02-20 | Disposition: A | Discharge status: Home / Self Care | Payer: BLUE CROSS/BLUE SHIELD | Source: Ambulatory Visit | Attending: Family Medicine | Admitting: Family Medicine

## 2015-02-20 VITALS — BP 148/88 | HR 71 | Temp 98.2°F | Ht 64.5 in | Wt 160.0 lb

## 2015-02-20 DIAGNOSIS — Z0000 Encounter for general adult medical examination without abnormal findings: Secondary | ICD-10-CM

## 2015-02-20 DIAGNOSIS — I10 Essential (primary) hypertension: Secondary | ICD-10-CM | POA: Diagnosis not present

## 2015-02-20 DIAGNOSIS — E785 Hyperlipidemia, unspecified: Secondary | ICD-10-CM

## 2015-02-20 DIAGNOSIS — Z01419 Encounter for gynecological examination (general) (routine) without abnormal findings: Secondary | ICD-10-CM | POA: Diagnosis present

## 2015-02-20 DIAGNOSIS — R7989 Other specified abnormal findings of blood chemistry: Secondary | ICD-10-CM

## 2015-02-20 DIAGNOSIS — Z1151 Encounter for screening for human papillomavirus (HPV): Secondary | ICD-10-CM | POA: Insufficient documentation

## 2015-02-20 LAB — LIPID PANEL
Cholesterol: 185 mg/dL (ref 0–200)
HDL: 55 mg/dL (ref >39.00)
LDL CALC: 109 mg/dL — AB (ref 0–99)
NonHDL: 130
TRIGLYCERIDES: 107 mg/dL (ref 0.0–149.0)
Total CHOL/HDL Ratio: 3
VLDL: 21.4 mg/dL (ref 0.0–40.0)

## 2015-02-20 LAB — CBC WITH DIFFERENTIAL/PLATELET
BASOS PCT: 0.8 % (ref 0.0–3.0)
Basophils Absolute: 0 10*3/uL (ref 0.0–0.1)
Eosinophils Absolute: 0.1 10*3/uL (ref 0.0–0.7)
Eosinophils Relative: 1.3 % (ref 0.0–5.0)
HCT: 37.5 % (ref 36.0–46.0)
Hemoglobin: 12.4 g/dL (ref 12.0–15.0)
LYMPHS ABS: 1.7 10*3/uL (ref 0.7–4.0)
LYMPHS PCT: 37.9 % (ref 12.0–46.0)
MCHC: 33.2 g/dL (ref 30.0–36.0)
MCV: 86.1 fl (ref 78.0–100.0)
MONOS PCT: 10.2 % (ref 3.0–12.0)
Monocytes Absolute: 0.5 10*3/uL (ref 0.1–1.0)
NEUTROS PCT: 49.8 % (ref 43.0–77.0)
Neutro Abs: 2.3 10*3/uL (ref 1.4–7.7)
Platelets: 225 10*3/uL (ref 150.0–400.0)
RBC: 4.35 Mil/uL (ref 3.87–5.11)
RDW: 14.1 % (ref 11.5–15.5)
WBC: 4.6 10*3/uL (ref 4.0–10.5)

## 2015-02-20 LAB — COMPREHENSIVE METABOLIC PANEL
ALT: 16 U/L (ref 0–35)
AST: 18 U/L (ref 0–37)
Albumin: 4.4 g/dL (ref 3.5–5.2)
Alkaline Phosphatase: 56 U/L (ref 39–117)
BILIRUBIN TOTAL: 0.4 mg/dL (ref 0.2–1.2)
BUN: 11 mg/dL (ref 6–23)
CALCIUM: 9.9 mg/dL (ref 8.4–10.5)
CHLORIDE: 104 meq/L (ref 96–112)
CO2: 29 meq/L (ref 19–32)
CREATININE: 0.79 mg/dL (ref 0.40–1.20)
GFR: 78.55 mL/min (ref >60.00)
Glucose, Bld: 95 mg/dL (ref 70–99)
POTASSIUM: 4.1 meq/L (ref 3.5–5.1)
SODIUM: 140 meq/L (ref 135–145)
Total Protein: 7.3 g/dL (ref 6.0–8.3)

## 2015-02-20 LAB — TSH: TSH: 2.25 u[IU]/mL (ref 0.35–4.50)

## 2015-02-20 LAB — T4, FREE: Free T4: 0.92 ng/dL (ref 0.60–1.60)

## 2015-02-20 MED ORDER — AMLODIPINE BESYLATE 5 MG PO TABS: 5.0000 mg | ORAL_TABLET | Freq: Every day | ORAL | Status: DC

## 2015-02-20 MED ORDER — ATORVASTATIN CALCIUM 20 MG PO TABS: 20.0000 mg | ORAL_TABLET | Freq: Every day | ORAL | Status: DC

## 2015-02-20 NOTE — Patient Instructions (Signed)
Lab today  Pap done today If you are interested in a shingles/zoster vaccine - call your insurance to check on coverage,( you should not get it within 1 month of other vaccines) , then call us for a prescription  for it to take to a pharmacy that gives the shot , or make a nurse visit to get it here depending on your coverage Thyroid lab today - if normal I will follow it  Take care of yourself  Take your BP medicine  Follow up for blood pressure in about 3 months

## 2015-02-20 NOTE — Progress Notes (Signed)
Pre visit review using our clinic review tool, if applicable. No additional management support is needed unless otherwise documented below in the visit note. 

## 2015-02-20 NOTE — Progress Notes (Signed)
Subjective:    Patient ID: Brittney Johnson, female    DOB: 07-08-1956, 59 y.o.   MRN: [id number]  HPI  Here for health maintenance exam and to review chronic medical problems    Forgot her bp med this weekend - and has not taken her dose  Usually feels dizzy when her bp  BP Readings from Last 3 Encounters:  02/20/15 148/88  08/30/14 138/76  08/05/14 144/88     Need to check her thyroid today- Dr Howell Rucks  She is moving and needs to see someone else in Feb -if she needs anything done  No thyroid symptoms  Lab Results  Component Value Date   TSH 2.190 08/30/2014      Had a pre syncopal episode in Dec - at her desk  Was dizzy after - and saw cardiol - had echo stress test  occ dizziness with change in positions    Wt is down 4 lb bmi if 27  Taking care of herself   Not interested in hep C or HIV screening   Pap 7/11 - had a hysterectomy  No gyn problems  No hx of cervical cancer  Supracervical hysterectomy   Zoster vaccine - ins covers - unsure where   Flu shot 10/15   colonosc 1/07- 10 year recall   Mm 11/15  Self exam -no lumps or changes   Td 5/12   Patient Active Problem List   Diagnosis Date Noted  . Near syncope 08/05/2014  . Abnormal EKG 08/05/2014  . Anxiety 08/05/2014  . H/O cold sores 06/06/2014  . Shoulder pain, right 05/24/2014  . Abnormal TSH 02/08/2014  . Dupuytren's contracture of right hand 02/08/2014  . Low back strain 06/17/2013  . Stress reaction 03/09/2013  . Routine general medical examination at a health care facility 12/05/2010  . Other screening mammogram 12/05/2010  . Raynaud disease 12/05/2010  . Hyperlipidemia 06/29/2010  . LEUKOCYTOPENIA UNSPECIFIED 06/29/2010  . HYPERTENSION, BENIGN ESSENTIAL 06/29/2010   Past Medical History  Diagnosis Date  . Hyperlipidemia   . Asthma   . Hypertension   . DDD (degenerative disc disease), cervical   . Atypical pneumonia 1998    not hosp  . Syncope and collapse    Past Surgical  History  Procedure Laterality Date  . Abdominal hysterectomy      supracervical- L ovary remains  . Cardiovascular stress test      08/28/06 normal   . Tubal ligation     History  Substance Use Topics  . Smoking status: Former Smoker    Quit date: 07/29/1997  . Smokeless tobacco: Never Used  . Alcohol Use: 0.0 oz/week    0 Standard drinks or equivalent per week     Comment: occassionally   Family History  Problem Relation Age of Onset  . Lung cancer Father   . Hyperlipidemia Mother   . Diabetes Mother   . Hypertension Mother   . Stroke Mother   . Lung cancer      uncle  . Pancreatic cancer      unspecified family member  . Diabetes      aunt  . Coronary artery disease Maternal Grandfather   . Heart disease Other   . Thyroid disease Neg Hx    Allergies  Allergen Reactions  . Codeine     REACTION: passed out   Current Outpatient Prescriptions on File Prior to Visit  Medication Sig Dispense Refill  . albuterol (VENTOLIN HFA) 108 (90 BASE) MCG/ACT  inhaler Inhale 2 puffs into the lungs every 4 (four) hours as needed for wheezing. 2 puffs prior to exercise as needed. 1 Inhaler 3  . amLODipine (NORVASC) 5 MG tablet Take 1 tablet (5 mg total) by mouth daily. 90 tablet 1  . aspirin 81 MG chewable tablet Chew 81 mg by mouth daily.      Marland Kitchen atorvastatin (LIPITOR) 20 MG tablet Take 1 tablet (20 mg total) by mouth daily. 90 tablet 1  . Calcium Carbonate-Vit D-Min 600-400 MG-UNIT TABS Take 1 tablet by mouth 2 (two) times daily.     . meloxicam (MOBIC) 15 MG tablet Take 1 tablet (15 mg total) by mouth daily. Take with food 90 tablet 0  . Multiple Vitamin (MULTIVITAMIN) tablet Take 1 tablet by mouth daily.    . Omega-3 Fatty Acids (FISH OIL) 1000 MG CAPS Take 1 capsule by mouth 2 (two) times daily.       No current facility-administered medications on file prior to visit.    Review of Systems    Review of Systems  Constitutional: Negative for fever, appetite change, fatigue and  unexpected weight change.  Eyes: Negative for pain and visual disturbance.  Respiratory: Negative for cough and shortness of breath.   Cardiovascular: Negative for cp or palpitations    Gastrointestinal: Negative for nausea, diarrhea and constipation.  Genitourinary: Negative for urgency and frequency.  Skin: Negative for pallor or rash   Neurological: Negative for weakness, light-headedness, numbness and headaches.  Hematological: Negative for adenopathy. Does not bruise/bleed easily.  Psychiatric/Behavioral: Negative for dysphoric mood. The patient is not nervous/anxious.      Objective:   Physical Exam  Constitutional: She appears well-developed and well-nourished. No distress.  overwt and well app  HENT:  Head: Normocephalic and atraumatic.  Right Ear: External ear normal.  Left Ear: External ear normal.  Mouth/Throat: Oropharynx is clear and moist.  Eyes: Conjunctivae and EOM are normal. Pupils are equal, round, and reactive to light. No scleral icterus.  Neck: Normal range of motion. Neck supple. No JVD present. Carotid bruit is not present. No thyromegaly present.  Cardiovascular: Normal rate, regular rhythm, normal heart sounds and intact distal pulses.  Exam reveals no gallop.   Pulmonary/Chest: Effort normal and breath sounds normal. No respiratory distress. She has no wheezes. She exhibits no tenderness.  Abdominal: Soft. Bowel sounds are normal. She exhibits no distension, no abdominal bruit and no mass. There is no tenderness.  Genitourinary: Vagina normal. No breast swelling, tenderness, discharge or bleeding. There is no rash, tenderness or lesion on the right labia. There is no rash, tenderness or lesion on the left labia. Cervix exhibits no motion tenderness, no discharge and no friability. No bleeding in the vagina. No vaginal discharge found.  Uterus /ovaries are surgically absent   Pap obtained from cervix   Breast exam: No mass, nodules, thickening, tenderness,  bulging, retraction, inflamation, nipple discharge or skin changes noted.  No axillary or clavicular LA.        Musculoskeletal: Normal range of motion. She exhibits no edema or tenderness.  Lymphadenopathy:    She has no cervical adenopathy.  Neurological: She is alert. She has normal reflexes. No cranial nerve deficit. She exhibits normal muscle tone. Coordination normal.  Skin: Skin is warm and dry. No rash noted. No erythema. No pallor.  Psychiatric: She has a normal mood and affect.          Assessment & Plan:   Problem List Items Addressed This Visit  Abnormal TSH    Pt has seen Dr Howell Rucks- she is leaving and we will do her labs They have been nl  If nl again likely we can follow her w/o endo f/u - pending lab to make plan        Relevant Orders   TSH (Completed)   T4, Free (Completed)   Encounter for routine gynecological examination   Relevant Orders   Cytology - PAP   Hyperlipidemia    Lipid panel today Atorvastatin refilled  Good diet  Disc goals for chol      Relevant Medications   amLODipine (NORVASC) 5 MG tablet   atorvastatin (LIPITOR) 20 MG tablet   HYPERTENSION, BENIGN ESSENTIAL    bp is up after forgetting medicine Disc compliance  Will set alarm on phone daily as reminder  F/u 3 mo for re check Disc DASH diet/exercise       Relevant Medications   amLODipine (NORVASC) 5 MG tablet   atorvastatin (LIPITOR) 20 MG tablet   Routine general medical examination at a health care facility - Primary    Reviewed health habits including diet and exercise and skin cancer prevention Reviewed appropriate screening tests for age  Also reviewed health mt list, fam hx and immunization status , as well as social and family history   See HPI Labs today  Gyn exam today       Relevant Orders   CBC with Differential/Platelet (Completed)   Comprehensive metabolic panel (Completed)   TSH (Completed)   Lipid panel (Completed)

## 2015-02-20 NOTE — Assessment & Plan Note (Signed)
Lipid panel today Atorvastatin refilled  Good diet  Disc goals for chol

## 2015-02-20 NOTE — Assessment & Plan Note (Signed)
Pt has seen Dr Howell Rucks- she is leaving and we will do her labs They have been nl  If nl again likely we can follow her w/o endo f/u - pending lab to make plan

## 2015-02-20 NOTE — Assessment & Plan Note (Signed)
bp is up after forgetting medicine Disc compliance  Will set alarm on phone daily as reminder  F/u 3 mo for re check Disc DASH diet/exercise

## 2015-02-20 NOTE — Assessment & Plan Note (Signed)
Reviewed health habits including diet and exercise and skin cancer prevention Reviewed appropriate screening tests for age  Also reviewed health mt list, fam hx and immunization status , as well as social and family history   See HPI Labs today  Gyn exam today

## 2015-02-22 LAB — CYTOLOGY - PAP

## 2015-04-20 ENCOUNTER — Ambulatory Visit (INDEPENDENT_AMBULATORY_CARE_PROVIDER_SITE_OTHER): Payer: BLUE CROSS/BLUE SHIELD

## 2015-04-20 DIAGNOSIS — Z23 Encounter for immunization: Secondary | ICD-10-CM | POA: Diagnosis not present

## 2015-05-23 ENCOUNTER — Ambulatory Visit (INDEPENDENT_AMBULATORY_CARE_PROVIDER_SITE_OTHER): Payer: BLUE CROSS/BLUE SHIELD | Admitting: Family Medicine

## 2015-05-23 ENCOUNTER — Encounter: Payer: Self-pay | Admitting: Family Medicine

## 2015-05-23 VITALS — BP 154/82 | HR 66 | Temp 98.0°F | Ht 64.5 in | Wt 161.2 lb

## 2015-05-23 DIAGNOSIS — I10 Essential (primary) hypertension: Secondary | ICD-10-CM

## 2015-05-23 DIAGNOSIS — Z23 Encounter for immunization: Secondary | ICD-10-CM | POA: Diagnosis not present

## 2015-05-23 MED ORDER — LISINOPRIL 10 MG PO TABS: 10.0000 mg | ORAL_TABLET | Freq: Every day | ORAL | Status: DC

## 2015-05-23 NOTE — Patient Instructions (Signed)
Continue amlodipine  Add lisinopril  Both in the am  If side effects please alert me  Drink lots of fluids/avoid excess sodium Get back to exercise when you can   Follow up with [place] in about 6 weeks

## 2015-05-23 NOTE — Progress Notes (Signed)
Subjective:    Patient ID: Brittney Johnson, female    DOB: July 09, 1956, 59 y.o.   MRN: [id number]  HPI Here for f/u of HTN   Wt is stable   Here for a bp f/u - last visit she had not taken her bp med  Took norvasc today 8 am   5 mg   BP Readings from Last 3 Encounters:  05/23/15 154/82  02/20/15 148/88  08/30/14 138/76    No headaches  No flushing  No swollen ankles  Exercise - on average the last 2-3 wk not much at all (busy at work)  She usually exercises   Does eat processed and salty foods like frozen dinners   Does not have the time   Takes meloxicam very rarely    Patient Active Problem List   Diagnosis Date Noted  . Encounter for routine gynecological examination 02/20/2015  . Near syncope 08/05/2014  . Abnormal EKG 08/05/2014  . Anxiety 08/05/2014  . H/O cold sores 06/06/2014  . Shoulder pain, right 05/24/2014  . Abnormal TSH 02/08/2014  . Dupuytren's contracture of right hand 02/08/2014  . Low back strain 06/17/2013  . Stress reaction 03/09/2013  . Routine general medical examination at a health care facility 12/05/2010  . Other screening mammogram 12/05/2010  . Raynaud disease 12/05/2010  . Hyperlipidemia 06/29/2010  . LEUKOCYTOPENIA UNSPECIFIED 06/29/2010  . HYPERTENSION, BENIGN ESSENTIAL 06/29/2010   Past Medical History  Diagnosis Date  . Hyperlipidemia   . Asthma   . Hypertension   . DDD (degenerative disc disease), cervical   . Atypical pneumonia 1998    not hosp  . Syncope and collapse    Past Surgical History  Procedure Laterality Date  . Abdominal hysterectomy      supracervical- L ovary remains  . Cardiovascular stress test      08/28/06 normal   . Tubal ligation     Social History  Substance Use Topics  . Smoking status: Former Smoker    Quit date: 07/29/1997  . Smokeless tobacco: Never Used  . Alcohol Use: 0.0 oz/week    0 Standard drinks or equivalent per week     Comment: occassionally   Family History  Problem  Relation Age of Onset  . Lung cancer Father   . Hyperlipidemia Mother   . Diabetes Mother   . Hypertension Mother   . Stroke Mother   . Lung cancer      uncle  . Pancreatic cancer      unspecified family member  . Diabetes      aunt  . Coronary artery disease Maternal Grandfather   . Heart disease Other   . Thyroid disease Neg Hx    Allergies  Allergen Reactions  . Bee Venom   . Codeine     REACTION: passed out   Current Outpatient Prescriptions on File Prior to Visit  Medication Sig Dispense Refill  . albuterol (VENTOLIN HFA) 108 (90 BASE) MCG/ACT inhaler Inhale 2 puffs into the lungs every 4 (four) hours as needed for wheezing. 2 puffs prior to exercise as needed. 1 Inhaler 3  . amLODipine (NORVASC) 5 MG tablet Take 1 tablet (5 mg total) by mouth daily. 90 tablet 3  . aspirin 81 MG chewable tablet Chew 81 mg by mouth daily.      Marland Kitchen atorvastatin (LIPITOR) 20 MG tablet Take 1 tablet (20 mg total) by mouth daily. 90 tablet 3  . Calcium Carbonate-Vit D-Min 600-400 MG-UNIT TABS Take 1 tablet  by mouth 2 (two) times daily.     . meloxicam (MOBIC) 15 MG tablet Take 1 tablet (15 mg total) by mouth daily. Take with food 90 tablet 0  . Multiple Vitamin (MULTIVITAMIN) tablet Take 1 tablet by mouth daily.    . Omega-3 Fatty Acids (FISH OIL) 1000 MG CAPS Take 1 capsule by mouth 2 (two) times daily.       No current facility-administered medications on file prior to visit.    Review of Systems Review of Systems  Constitutional: Negative for fever, appetite change, fatigue and unexpected weight change.  Eyes: Negative for pain and visual disturbance.  Respiratory: Negative for cough and shortness of breath.   Cardiovascular: Negative for cp or palpitations    Gastrointestinal: Negative for nausea, diarrhea and constipation.  Genitourinary: Negative for urgency and frequency.  Skin: Negative for pallor or rash   Neurological: Negative for weakness, light-headedness, numbness and  headaches.  Hematological: Negative for adenopathy. Does not bruise/bleed easily.  Psychiatric/Behavioral: Negative for dysphoric mood. The patient is not nervous/anxious.         Objective:   Physical Exam  Constitutional: She appears well-developed and well-nourished. No distress.  HENT:  Head: Normocephalic and atraumatic.  Mouth/Throat: Oropharynx is clear and moist.  Eyes: Conjunctivae and EOM are normal. Pupils are equal, round, and reactive to light.  Neck: Normal range of motion. Neck supple. No JVD present. Carotid bruit is not present. No thyromegaly present.  Cardiovascular: Normal rate, regular rhythm, normal heart sounds and intact distal pulses.  Exam reveals no gallop.   Pulmonary/Chest: Effort normal and breath sounds normal. No respiratory distress. She has no wheezes. She has no rales.  No crackles  Abdominal: Soft. Bowel sounds are normal. She exhibits no distension, no abdominal bruit and no mass. There is no tenderness.  Musculoskeletal: She exhibits no edema.  Lymphadenopathy:    She has no cervical adenopathy.  Neurological: She is alert. She has normal reflexes.  Skin: Skin is warm and dry. No rash noted.  Psychiatric: She has a normal mood and affect.          Assessment & Plan:   Problem List Items Addressed This Visit      Cardiovascular and Mediastinum   HYPERTENSION, BENIGN ESSENTIAL - Primary    Not at goal still  Of note pt has had HTN since and early age-may be progressing  Disc DASH eating plan and getting back to exercise Continue amlodipine and add lisinopril 10 mg daily Update if side eff or problems F/u 6 wk       Relevant Medications   lisinopril (PRINIVIL,ZESTRIL) 10 MG tablet    Other Visit Diagnoses    Need for shingles vaccine        Relevant Orders    Varicella-zoster vaccine subcutaneous (Completed)

## 2015-05-23 NOTE — Progress Notes (Signed)
Pre visit review using our clinic review tool, if applicable. No additional management support is needed unless otherwise documented below in the visit note. 

## 2015-05-24 ENCOUNTER — Ambulatory Visit: Payer: BLUE CROSS/BLUE SHIELD

## 2015-05-24 NOTE — Assessment & Plan Note (Signed)
Not at goal still  Of note pt has had HTN since and early age-may be progressing  Disc DASH eating plan and getting back to exercise Continue amlodipine and add lisinopril 10 mg daily Update if side eff or problems F/u 6 wk

## 2015-06-09 ENCOUNTER — Telehealth: Payer: Self-pay | Admitting: Family Medicine

## 2015-06-09 MED ORDER — ALBUTEROL SULFATE HFA 108 (90 BASE) MCG/ACT IN AERS: 2.0000 | INHALATION_SPRAY | RESPIRATORY_TRACT | Status: DC | PRN

## 2015-06-09 NOTE — Telephone Encounter (Signed)
[facility] Patient Name: Brittney Johnson DOB: 06-17-1956 Initial Comment Caller states was seen and was given another bp SCRIPT; now has a cold and needs to know what to do Nurse Assessment Nurse: Markus Daft, RN, Sherre Poot Date/Time Eilene Ghazi Time): 06/09/2015 8:55:47 AM Confirm and document reason for call. If symptomatic, describe symptoms. ---Caller states was seen by MD a week ago for HTN and given Amlodipine in addition to Lisinopril. And now has a cold that started yesterday, and worse today with dry cough and nasal stuffiness and chest discomfort all the time like heaviness but she has not been coughing a lot - and noticed with coughing only (rates 4-5/10) and sore throat which is lessening. She needs to know what to do? Has the patient traveled out of the country within the last 30 days? ---Not Applicable Does the patient have any new or worsening symptoms? ---Yes Will a triage be completed? ---Yes Related visit to physician within the last 2 weeks? ---Yes Does the PT have any chronic conditions? (i.e. diabetes, asthma, etc.) ---Yes List chronic conditions. ---HTN, High cholesterol, Allergy: Codeine; pneumonia once; had inhaler for exercise induced and allergy induced asthma but hasn't inhaler for over a year. Guidelines Guideline Title Affirmed Question Affirmed Notes Cough - Acute Non-Productive ALSO, mild central chest pain occurs only when coughing Final Disposition [place], RN, Sherre Poot Comments -She has not had this dry cough while on Lisinopril so cont. with triage. -RN also advised fluids, humidifier, honey 2 tsp PRN will help loosen cough. As it progresses RN advised that she could try Mucinex as needed, OTC. Caller verb. understanding. info. per [place] guideline -In the past has used Albuterol inhaler, and while this doesn't feel like asthma attack. The  inhaler she has is expired. OFFICE: Please refill it, and sent to [facility] in [place]. RN explained to caller that this can be a good cough medicine.

## 2015-06-09 NOTE — Addendum Note (Signed)
Addended by: Helene Shoe on: 06/09/2015 09:50 AM   Modules accepted: Orders

## 2015-06-09 NOTE — Telephone Encounter (Signed)
Thanks for refilling albuterol Agree with recommendations  F/u if worse or no improvement

## 2015-06-09 NOTE — Telephone Encounter (Signed)
Albuterol inhaler refilled as requested; last f/u visit 05/23/15 and last annual exam 02/20/15. Pt has f/u appt scheduled with Dr Glori Bickers 07/04/15.

## 2015-06-26 ENCOUNTER — Other Ambulatory Visit: Payer: Self-pay | Admitting: Orthopedic Surgery

## 2015-07-04 ENCOUNTER — Ambulatory Visit (INDEPENDENT_AMBULATORY_CARE_PROVIDER_SITE_OTHER): Payer: BLUE CROSS/BLUE SHIELD | Admitting: Family Medicine

## 2015-07-04 ENCOUNTER — Encounter: Payer: Self-pay | Admitting: Family Medicine

## 2015-07-04 VITALS — BP 122/70 | HR 70 | Temp 98.3°F | Ht 64.5 in | Wt 163.5 lb

## 2015-07-04 DIAGNOSIS — M720 Palmar fascial fibromatosis [Dupuytren]: Secondary | ICD-10-CM | POA: Diagnosis not present

## 2015-07-04 DIAGNOSIS — I10 Essential (primary) hypertension: Secondary | ICD-10-CM

## 2015-07-04 NOTE — Patient Instructions (Addendum)
Blood pressure is much improved Take care of yourself  Try to exercise as able and watch sodium in diet   Schedule annual exam after 02/20/16 with labs prior   Don't forget to schedule your mammogram at [facility]

## 2015-07-04 NOTE — Progress Notes (Signed)
Subjective:    Patient ID: Brittney Johnson, female    DOB: June 27, 1956, 59 y.o.   MRN: [id number]  HPI Here for f/u of HTN  Wt is up 2 lb with bmi of 6  Feels very good  Taking care of herself   bp is improved today after adding lisinopril 10 mg last time (tolerates well) -no side effects at all  Was sleepy for a few days  No cp or palpitations or headaches or edema  No side effects to medicines  BP Readings from Last 3 Encounters:  07/04/15 126/68  05/23/15 154/82  02/20/15 148/88    occ checks at home - usually about the same as today  It went up a bit in the hospital      Chemistry      Component Value Date/Time   NA 140 02/20/2015 0850   NA 140 07/15/2014 0932   K 4.1 02/20/2015 0850   K 3.5 07/15/2014 0932   CL 104 02/20/2015 0850   CL 106 07/15/2014 0932   CO2 29 02/20/2015 0850   CO2 25 07/15/2014 0932   BUN 11 02/20/2015 0850   BUN 15 07/15/2014 0932   CREATININE 0.79 02/20/2015 0850   CREATININE 0.87 07/15/2014 0932      Component Value Date/Time   CALCIUM 9.9 02/20/2015 0850   CALCIUM 9.1 07/15/2014 0932   ALKPHOS 56 02/20/2015 0850   ALKPHOS 64 07/15/2014 0932   AST 18 02/20/2015 0850   AST 26 07/15/2014 0932   ALT 16 02/20/2015 0850   ALT 24 07/15/2014 0932   BILITOT 0.4 02/20/2015 0850   BILITOT 0.2 07/15/2014 0932       Review of Systems Review of Systems  Constitutional: Negative for fever, appetite change, fatigue and unexpected weight change.  Eyes: Negative for pain and visual disturbance.  Respiratory: Negative for cough and shortness of breath.   Cardiovascular: Negative for cp or palpitations    Gastrointestinal: Negative for nausea, diarrhea and constipation.  Genitourinary: Negative for urgency and frequency.  Skin: Negative for pallor or rash   Neurological: Negative for weakness, light-headedness, numbness and headaches.  Hematological: Negative for adenopathy. Does not bruise/bleed easily.  Psychiatric/Behavioral: Negative  for dysphoric mood. The patient is not nervous/anxious.         Objective:   Physical Exam  Constitutional: She appears well-developed and well-nourished. No distress.  Well appearing   HENT:  Head: Normocephalic and atraumatic.  Mouth/Throat: Oropharynx is clear and moist.  Eyes: Conjunctivae and EOM are normal. Pupils are equal, round, and reactive to light.  Neck: Normal range of motion. Neck supple. No JVD present. Carotid bruit is not present. No thyromegaly present.  Cardiovascular: Normal rate, regular rhythm, normal heart sounds and intact distal pulses.  Exam reveals no gallop.   Pulmonary/Chest: Effort normal and breath sounds normal. No respiratory distress. She has no wheezes. She has no rales.  No crackles  Abdominal: Soft. Bowel sounds are normal. She exhibits no distension, no abdominal bruit and no mass. There is no tenderness.  Musculoskeletal: She exhibits no edema.  L hand is wrapped post surgically  Lymphadenopathy:    She has no cervical adenopathy.  Neurological: She is alert. She has normal reflexes.  Skin: Skin is warm and dry. No rash noted.  Psychiatric: She has a normal mood and affect.          Assessment & Plan:   Problem List Items Addressed This Visit      Cardiovascular  and Mediastinum   HYPERTENSION, BENIGN ESSENTIAL - Primary    Improved with addn of lisinopril 10 mg  Tolerates well bp in fair control at this time  BP Readings from Last 1 Encounters:  07/04/15 122/70   No changes needed Disc lifstyle change with low sodium diet and exercise          Musculoskeletal and Integument   Dupuytren's contracture of right hand

## 2015-07-04 NOTE — Assessment & Plan Note (Signed)
Improved with addn of lisinopril 10 mg  Tolerates well bp in fair control at this time  BP Readings from Last 1 Encounters:  07/04/15 122/70   No changes needed Disc lifstyle change with low sodium diet and exercise

## 2015-07-04 NOTE — Progress Notes (Signed)
Pre visit review using our clinic review tool, if applicable. No additional management support is needed unless otherwise documented below in the visit note. 

## 2015-07-05 ENCOUNTER — Encounter: Payer: Self-pay | Admitting: Family Medicine

## 2015-07-10 DIAGNOSIS — M720 Palmar fascial fibromatosis [Dupuytren]: Secondary | ICD-10-CM | POA: Insufficient documentation

## 2015-07-17 ENCOUNTER — Other Ambulatory Visit: Payer: Self-pay | Admitting: *Deleted

## 2015-07-17 MED ORDER — LISINOPRIL 10 MG PO TABS: 10.0000 mg | ORAL_TABLET | Freq: Every day | ORAL | Status: DC

## 2015-08-31 ENCOUNTER — Ambulatory Visit: Payer: BLUE CROSS/BLUE SHIELD | Admitting: Endocrinology

## 2015-09-04 ENCOUNTER — Other Ambulatory Visit: Payer: Self-pay | Admitting: Family Medicine

## 2015-09-04 DIAGNOSIS — Z1231 Encounter for screening mammogram for malignant neoplasm of breast: Secondary | ICD-10-CM

## 2015-09-05 ENCOUNTER — Ambulatory Visit
Admission: RE | Admit: 2015-09-05 | Discharge: 2015-09-05 | Disposition: A | Discharge status: Home / Self Care | Payer: BLUE CROSS/BLUE SHIELD | Source: Ambulatory Visit | Attending: Family Medicine | Admitting: Family Medicine

## 2015-09-05 DIAGNOSIS — Z1231 Encounter for screening mammogram for malignant neoplasm of breast: Secondary | ICD-10-CM | POA: Insufficient documentation

## 2015-11-30 ENCOUNTER — Other Ambulatory Visit (INDEPENDENT_AMBULATORY_CARE_PROVIDER_SITE_OTHER): Payer: BLUE CROSS/BLUE SHIELD

## 2015-11-30 DIAGNOSIS — R7989 Other specified abnormal findings of blood chemistry: Secondary | ICD-10-CM

## 2015-11-30 DIAGNOSIS — Z0000 Encounter for general adult medical examination without abnormal findings: Secondary | ICD-10-CM | POA: Diagnosis not present

## 2015-11-30 LAB — COMPREHENSIVE METABOLIC PANEL
ALT: 18 U/L (ref 0–35)
AST: 18 U/L (ref 0–37)
Albumin: 4.6 g/dL (ref 3.5–5.2)
Alkaline Phosphatase: 61 U/L (ref 39–117)
BUN: 13 mg/dL (ref 6–23)
CHLORIDE: 103 meq/L (ref 96–112)
CO2: 28 mEq/L (ref 19–32)
CREATININE: 0.82 mg/dL (ref 0.40–1.20)
Calcium: 10.1 mg/dL (ref 8.4–10.5)
GFR: 75.05 mL/min (ref >60.00)
GLUCOSE: 99 mg/dL (ref 70–99)
POTASSIUM: 4.5 meq/L (ref 3.5–5.1)
SODIUM: 139 meq/L (ref 135–145)
TOTAL PROTEIN: 7.5 g/dL (ref 6.0–8.3)
Total Bilirubin: 0.4 mg/dL (ref 0.2–1.2)

## 2015-11-30 LAB — CBC WITH DIFFERENTIAL/PLATELET
Basophils Absolute: 0 10*3/uL (ref 0.0–0.1)
Basophils Relative: 0.6 % (ref 0.0–3.0)
EOS ABS: 0.1 10*3/uL (ref 0.0–0.7)
EOS PCT: 1.1 % (ref 0.0–5.0)
HCT: 37.7 % (ref 36.0–46.0)
Hemoglobin: 12.7 g/dL (ref 12.0–15.0)
LYMPHS ABS: 1.8 10*3/uL (ref 0.7–4.0)
Lymphocytes Relative: 35 % (ref 12.0–46.0)
MCHC: 33.8 g/dL (ref 30.0–36.0)
MCV: 85.6 fl (ref 78.0–100.0)
MONO ABS: 0.4 10*3/uL (ref 0.1–1.0)
Monocytes Relative: 8.1 % (ref 3.0–12.0)
NEUTROS PCT: 55.2 % (ref 43.0–77.0)
Neutro Abs: 2.8 10*3/uL (ref 1.4–7.7)
Platelets: 247 10*3/uL (ref 150.0–400.0)
RBC: 4.4 Mil/uL (ref 3.87–5.11)
RDW: 13.9 % (ref 11.5–15.5)
WBC: 5 10*3/uL (ref 4.0–10.5)

## 2015-11-30 LAB — T3, FREE: T3 FREE: 3.2 pg/mL (ref 2.3–4.2)

## 2015-11-30 LAB — LIPID PANEL
Cholesterol: 194 mg/dL (ref 0–200)
HDL: 57.9 mg/dL (ref >39.00)
LDL CALC: 100 mg/dL — AB (ref 0–99)
NONHDL: 135.89
Total CHOL/HDL Ratio: 3
Triglycerides: 181 mg/dL — ABNORMAL HIGH (ref 0.0–149.0)
VLDL: 36.2 mg/dL (ref 0.0–40.0)

## 2015-11-30 LAB — T4, FREE: Free T4: 0.84 ng/dL (ref 0.60–1.60)

## 2015-11-30 LAB — TSH: TSH: 1.3 u[IU]/mL (ref 0.35–4.50)

## 2015-12-05 ENCOUNTER — Encounter: Payer: Self-pay | Admitting: Family Medicine

## 2015-12-05 ENCOUNTER — Ambulatory Visit (INDEPENDENT_AMBULATORY_CARE_PROVIDER_SITE_OTHER): Payer: BLUE CROSS/BLUE SHIELD | Admitting: Family Medicine

## 2015-12-05 VITALS — BP 120/72 | HR 75 | Temp 98.0°F | Ht 64.5 in | Wt 164.8 lb

## 2015-12-05 DIAGNOSIS — E049 Nontoxic goiter, unspecified: Secondary | ICD-10-CM | POA: Diagnosis not present

## 2015-12-05 NOTE — Patient Instructions (Signed)
Please stop at check out for referral for thyroid ultrasound  We will see what results show

## 2015-12-05 NOTE — Progress Notes (Signed)
Subjective:    Patient ID: Brittney Johnson, female    DOB: 07-31-1955, 60 y.o.   MRN: [id number]  HPI Here for pressure in throat/neck  About 2 weeks   Wonders if her thyroid is enlarged -feels like it when she feels it with her hand  In the middle  Does not hurt- just pressure   Thyroid testing was normal - Lab Results  Component Value Date   TSH 1.30 11/30/2015    Also nl free T4 and also T3  Not a lot of throat clearing occ dry cough  Patient Active Problem List   Diagnosis Date Noted  . Encounter for routine gynecological examination 02/20/2015  . Near syncope 08/05/2014  . Abnormal EKG 08/05/2014  . Anxiety 08/05/2014  . H/O cold sores 06/06/2014  . Shoulder pain, right 05/24/2014  . Abnormal TSH 02/08/2014  . Dupuytren's contracture of right hand 02/08/2014  . Low back strain 06/17/2013  . Stress reaction 03/09/2013  . Routine general medical examination at a health care facility 12/05/2010  . Other screening mammogram 12/05/2010  . Raynaud disease 12/05/2010  . Hyperlipidemia 06/29/2010  . LEUKOCYTOPENIA UNSPECIFIED 06/29/2010  . HYPERTENSION, BENIGN ESSENTIAL 06/29/2010   Past Medical History  Diagnosis Date  . Hyperlipidemia   . Asthma   . Hypertension   . DDD (degenerative disc disease), cervical   . Atypical pneumonia 1998    not hosp  . Syncope and collapse    Past Surgical History  Procedure Laterality Date  . Abdominal hysterectomy      supracervical- L ovary remains  . Cardiovascular stress test      08/28/06 normal   . Tubal ligation    . Left hand surgery Left 2016    Dupuytrens   Social History  Substance Use Topics  . Smoking status: Former Smoker    Quit date: 07/29/1997  . Smokeless tobacco: Never Used  . Alcohol Use: 0.0 oz/week    0 Standard drinks or equivalent per week     Comment: occassionally   Family History  Problem Relation Age of Onset  . Lung cancer Father   . Hyperlipidemia Mother   . Diabetes Mother   .  Hypertension Mother   . Stroke Mother   . Lung cancer      uncle  . Pancreatic cancer      unspecified family member  . Diabetes      aunt  . Coronary artery disease Maternal Grandfather   . Heart disease Other   . Thyroid disease Neg Hx    Allergies  Allergen Reactions  . Bee Venom   . Codeine     REACTION: passed out   Current Outpatient Prescriptions on File Prior to Visit  Medication Sig Dispense Refill  . albuterol (VENTOLIN HFA) 108 (90 BASE) MCG/ACT inhaler Inhale 2 puffs into the lungs every 4 (four) hours as needed for wheezing. 2 puffs prior to exercise as needed. 1 Inhaler 3  . amLODipine (NORVASC) 5 MG tablet Take 1 tablet (5 mg total) by mouth daily. 90 tablet 3  . aspirin 81 MG chewable tablet Chew 81 mg by mouth daily.      Marland Kitchen atorvastatin (LIPITOR) 20 MG tablet Take 1 tablet (20 mg total) by mouth daily. 90 tablet 3  . Calcium Carbonate-Vit D-Min 600-400 MG-UNIT TABS Take 1 tablet by mouth 2 (two) times daily.     Marland Kitchen lisinopril (PRINIVIL,ZESTRIL) 10 MG tablet Take 1 tablet (10 mg total) by mouth daily. [place]  tablet 1  . Multiple Vitamin (MULTIVITAMIN) tablet Take 1 tablet by mouth daily.    . Omega-3 Fatty Acids (FISH OIL) 1000 MG CAPS Take 1 capsule by mouth 2 (two) times daily.      . meloxicam (MOBIC) 15 MG tablet Take 1 tablet (15 mg total) by mouth daily. Take with food (Patient not taking: Reported on 12/05/2015) 90 tablet 0   No current facility-administered medications on file prior to visit.    Review of Systems Review of Systems  Constitutional: Negative for fever, appetite change, fatigue and unexpected weight change.  Eyes: Negative for pain and visual disturbance.  ENT neg for ST or pnd or dysphagia , pos for discomfort in neck Respiratory: Negative for cough and shortness of breath.   Cardiovascular: Negative for cp or palpitations    Gastrointestinal: Negative for nausea, diarrhea and constipation.  Genitourinary: Negative for urgency and frequency.    Skin: Negative for pallor or rash   Neurological: Negative for weakness, light-headedness, numbness and headaches.  Hematological: Negative for adenopathy. Does not bruise/bleed easily.  Psychiatric/Behavioral: Negative for dysphoric mood. The patient is not nervous/anxious.         Objective:   Physical Exam  Constitutional: She appears well-developed and well-nourished. No distress.  Well appearing  HENT:  Head: Normocephalic and atraumatic.  Mouth/Throat: Oropharynx is clear and moist.  Eyes: Conjunctivae and EOM are normal. Pupils are equal, round, and reactive to light. No scleral icterus.  Neck: Normal range of motion. Neck supple. No JVD present. No tracheal deviation present.  Thyroid is prominent (pt also has a long neck) -no individual nodules felt  Feels symmetric Non tender   Cardiovascular: Normal rate, regular rhythm and normal heart sounds.   Pulmonary/Chest: Effort normal and breath sounds normal.  Musculoskeletal: She exhibits no edema.  Lymphadenopathy:    She has no cervical adenopathy.  Neurological: She is alert. She has normal reflexes. She displays no tremor. No cranial nerve deficit. She exhibits normal muscle tone. Gait normal.  Skin: Skin is warm and dry. No rash noted. No erythema. No pallor.  Psychiatric: She has a normal mood and affect.          Assessment & Plan:   Problem List Items Addressed This Visit      Endocrine   Enlarged thyroid - Primary    Prominent but symmetric thyroid on exam  Pt is symptomatic in neck  Has had low tsh in the past-nl panel now  Will send for a thyroid ultrasound and go from there when result returns        Relevant Orders   US THYROID

## 2015-12-05 NOTE — Progress Notes (Signed)
Pre visit review using our clinic review tool, if applicable. No additional management support is needed unless otherwise documented below in the visit note. 

## 2015-12-06 NOTE — Assessment & Plan Note (Signed)
Prominent but symmetric thyroid on exam  Pt is symptomatic in neck  Has had low tsh in the past-nl panel now  Will send for a thyroid ultrasound and go from there when result returns

## 2015-12-08 ENCOUNTER — Ambulatory Visit
Admission: RE | Admit: 2015-12-08 | Discharge: 2015-12-08 | Disposition: A | Discharge status: Home / Self Care | Payer: BLUE CROSS/BLUE SHIELD | Source: Ambulatory Visit | Attending: Family Medicine | Admitting: Family Medicine

## 2015-12-08 DIAGNOSIS — E079 Disorder of thyroid, unspecified: Secondary | ICD-10-CM | POA: Diagnosis not present

## 2015-12-08 DIAGNOSIS — E049 Nontoxic goiter, unspecified: Secondary | ICD-10-CM

## 2015-12-12 ENCOUNTER — Telehealth: Payer: Self-pay | Admitting: Family Medicine

## 2015-12-12 DIAGNOSIS — M542 Cervicalgia: Secondary | ICD-10-CM

## 2015-12-12 NOTE — Telephone Encounter (Signed)
The reason I am starting with ENT is because of the pressure sensation she has in her neck - unsure if it is due to her thyroid or not.  They should also be able to comment further on the heterogenous appearance of thyroid (since I do not know what it means or if it is significant) I would prefer endocrine if her thyroid tests were abnormal-but they are not  We may have to re visit endocrine in the future-but I want to start with ENT  I will route the referral to [facility]

## 2015-12-12 NOTE — Telephone Encounter (Signed)
-----   Message from [place], [place] sent at 12/12/2015  1:02 PM EDT ----- Pt did view results on my chart and she had some questions, pt wanted to know what does heterogeneous texture of her thyroid means, and also she thought the next step would be a referral to Endocrinologist not an ENT Doctor since its dealing with her thyroid but pt said she is okay with going to where ever you think she should go so if you want to stick with an ENT referral you can put referral in or if you want her to send Endo you can put that referral in

## 2015-12-12 NOTE — Telephone Encounter (Signed)
Pt notified of Dr. Marliss Coots comments and recommendations and verbalized understanding. Pt is okay with seeing ENT 1st and I advise pt our [facility] will call her to schedule an appt

## 2015-12-15 DIAGNOSIS — J310 Chronic rhinitis: Secondary | ICD-10-CM | POA: Diagnosis not present

## 2015-12-15 DIAGNOSIS — J342 Deviated nasal septum: Secondary | ICD-10-CM | POA: Diagnosis not present

## 2015-12-15 DIAGNOSIS — K219 Gastro-esophageal reflux disease without esophagitis: Secondary | ICD-10-CM | POA: Diagnosis not present

## 2016-01-03 ENCOUNTER — Other Ambulatory Visit: Payer: Self-pay | Admitting: Family Medicine

## 2016-01-17 DIAGNOSIS — D485 Neoplasm of uncertain behavior of skin: Secondary | ICD-10-CM | POA: Diagnosis not present

## 2016-01-17 DIAGNOSIS — L578 Other skin changes due to chronic exposure to nonionizing radiation: Secondary | ICD-10-CM | POA: Diagnosis not present

## 2016-01-17 DIAGNOSIS — L739 Follicular disorder, unspecified: Secondary | ICD-10-CM | POA: Diagnosis not present

## 2016-01-17 DIAGNOSIS — I788 Other diseases of capillaries: Secondary | ICD-10-CM | POA: Diagnosis not present

## 2016-01-17 DIAGNOSIS — D229 Melanocytic nevi, unspecified: Secondary | ICD-10-CM | POA: Diagnosis not present

## 2016-01-17 DIAGNOSIS — L570 Actinic keratosis: Secondary | ICD-10-CM | POA: Diagnosis not present

## 2016-01-17 DIAGNOSIS — L820 Inflamed seborrheic keratosis: Secondary | ICD-10-CM | POA: Diagnosis not present

## 2016-02-15 DIAGNOSIS — L570 Actinic keratosis: Secondary | ICD-10-CM | POA: Diagnosis not present

## 2016-03-28 ENCOUNTER — Telehealth: Payer: Self-pay | Admitting: Family Medicine

## 2016-03-28 DIAGNOSIS — Z0000 Encounter for general adult medical examination without abnormal findings: Secondary | ICD-10-CM

## 2016-03-28 NOTE — Telephone Encounter (Signed)
-----   Message from Marchia Bond sent at 03/26/2016  1:44 PM EDT ----- Regarding: Cpx labs Tues 9/5, need orders. Thanks! :-) Please order  future cpx labs for pt's upcoming lab appt. Thanks Aniceto Boss

## 2016-04-02 ENCOUNTER — Other Ambulatory Visit (INDEPENDENT_AMBULATORY_CARE_PROVIDER_SITE_OTHER): Payer: BLUE CROSS/BLUE SHIELD

## 2016-04-02 DIAGNOSIS — Z0000 Encounter for general adult medical examination without abnormal findings: Secondary | ICD-10-CM | POA: Diagnosis not present

## 2016-04-02 LAB — COMPREHENSIVE METABOLIC PANEL
ALBUMIN: 4.3 g/dL (ref 3.5–5.2)
ALK PHOS: 54 U/L (ref 39–117)
ALT: 15 U/L (ref 0–35)
AST: 18 U/L (ref 0–37)
BILIRUBIN TOTAL: 0.4 mg/dL (ref 0.2–1.2)
BUN: 16 mg/dL (ref 6–23)
CALCIUM: 9.4 mg/dL (ref 8.4–10.5)
CO2: 29 mEq/L (ref 19–32)
CREATININE: 0.85 mg/dL (ref 0.40–1.20)
Chloride: 103 mEq/L (ref 96–112)
GFR: 71.92 mL/min (ref >60.00)
Glucose, Bld: 103 mg/dL — ABNORMAL HIGH (ref 70–99)
Potassium: 4.7 mEq/L (ref 3.5–5.1)
Sodium: 136 mEq/L (ref 135–145)
Total Protein: 7.5 g/dL (ref 6.0–8.3)

## 2016-04-02 LAB — CBC WITH DIFFERENTIAL/PLATELET
BASOS ABS: 0 10*3/uL (ref 0.0–0.1)
BASOS PCT: 1 % (ref 0.0–3.0)
EOS ABS: 0.1 10*3/uL (ref 0.0–0.7)
Eosinophils Relative: 1.3 % (ref 0.0–5.0)
HEMATOCRIT: 36.6 % (ref 36.0–46.0)
HEMOGLOBIN: 12.4 g/dL (ref 12.0–15.0)
LYMPHS PCT: 38.8 % (ref 12.0–46.0)
Lymphs Abs: 1.6 10*3/uL (ref 0.7–4.0)
MCHC: 33.9 g/dL (ref 30.0–36.0)
MCV: 85 fl (ref 78.0–100.0)
MONOS PCT: 10.5 % (ref 3.0–12.0)
Monocytes Absolute: 0.4 10*3/uL (ref 0.1–1.0)
Neutro Abs: 2 10*3/uL (ref 1.4–7.7)
Neutrophils Relative %: 48.4 % (ref 43.0–77.0)
Platelets: 223 10*3/uL (ref 150.0–400.0)
RBC: 4.31 Mil/uL (ref 3.87–5.11)
RDW: 13.7 % (ref 11.5–15.5)
WBC: 4.2 10*3/uL (ref 4.0–10.5)

## 2016-04-02 LAB — LIPID PANEL
CHOLESTEROL: 200 mg/dL (ref 0–200)
HDL: 51.6 mg/dL (ref >39.00)
LDL Cholesterol: 118 mg/dL — ABNORMAL HIGH (ref 0–99)
NonHDL: 148.53
TRIGLYCERIDES: 152 mg/dL — AB (ref 0.0–149.0)
Total CHOL/HDL Ratio: 4
VLDL: 30.4 mg/dL (ref 0.0–40.0)

## 2016-04-02 LAB — TSH: TSH: 1.98 u[IU]/mL (ref 0.35–4.50)

## 2016-04-03 DIAGNOSIS — H524 Presbyopia: Secondary | ICD-10-CM | POA: Diagnosis not present

## 2016-04-09 ENCOUNTER — Ambulatory Visit (INDEPENDENT_AMBULATORY_CARE_PROVIDER_SITE_OTHER): Payer: BLUE CROSS/BLUE SHIELD | Admitting: Family Medicine

## 2016-04-09 ENCOUNTER — Encounter: Payer: Self-pay | Admitting: Family Medicine

## 2016-04-09 VITALS — BP 132/72 | HR 75 | Temp 98.9°F | Ht 64.5 in | Wt 162.8 lb

## 2016-04-09 DIAGNOSIS — E559 Vitamin D deficiency, unspecified: Secondary | ICD-10-CM

## 2016-04-09 DIAGNOSIS — Z23 Encounter for immunization: Secondary | ICD-10-CM | POA: Diagnosis not present

## 2016-04-09 DIAGNOSIS — Z1211 Encounter for screening for malignant neoplasm of colon: Secondary | ICD-10-CM

## 2016-04-09 DIAGNOSIS — E785 Hyperlipidemia, unspecified: Secondary | ICD-10-CM | POA: Diagnosis not present

## 2016-04-09 DIAGNOSIS — K219 Gastro-esophageal reflux disease without esophagitis: Secondary | ICD-10-CM | POA: Insufficient documentation

## 2016-04-09 DIAGNOSIS — Z0000 Encounter for general adult medical examination without abnormal findings: Secondary | ICD-10-CM | POA: Diagnosis not present

## 2016-04-09 DIAGNOSIS — I10 Essential (primary) hypertension: Secondary | ICD-10-CM

## 2016-04-09 MED ORDER — ATORVASTATIN CALCIUM 20 MG PO TABS: 20.0000 mg | ORAL_TABLET | Freq: Every day | ORAL | 3 refills | Status: DC

## 2016-04-09 MED ORDER — OMEPRAZOLE 40 MG PO CPDR: 40.0000 mg | DELAYED_RELEASE_CAPSULE | Freq: Every day | ORAL | 3 refills | Status: DC | PRN

## 2016-04-09 MED ORDER — AMLODIPINE BESYLATE 5 MG PO TABS: 5.0000 mg | ORAL_TABLET | Freq: Every day | ORAL | 3 refills | Status: DC

## 2016-04-09 NOTE — Assessment & Plan Note (Signed)
Disc goals for lipids and reasons to control them Rev labs with pt Rev low sat fat diet in detail   

## 2016-04-09 NOTE — Assessment & Plan Note (Signed)
Enc to take this daily Disc importance of this to bone and overall health

## 2016-04-09 NOTE — Assessment & Plan Note (Signed)
Reviewed health habits including diet and exercise and skin cancer prevention Reviewed appropriate screening tests for age  Also reviewed health mt list, fam hx and immunization status , as well as social and family history    See HPI Labs reviewed Stop at check out for referral for screening colonoscopy  Take calcium with D every day once daily and then D alone 2000 iu once daily  For cholesterol     Avoid red meat/ fried foods/ egg yolks/ fatty breakfast meats/ butter, cheese and high fat dairy/ and shellfish   Flu shot today

## 2016-04-09 NOTE — Patient Instructions (Addendum)
Stop at check out for referral for screening colonoscopy  Take calcium with D every day once daily and then D alone 2000 iu once daily  For cholesterol     Avoid red meat/ fried foods/ egg yolks/ fatty breakfast meats/ butter, cheese and high fat dairy/ and shellfish   Flu shot today

## 2016-04-09 NOTE — Assessment & Plan Note (Signed)
Will get back on omeprazole for at least 6-12 mo to get her vocal cords to heal and see if we can fix laryngeal symptoms  Disc diet

## 2016-04-09 NOTE — Assessment & Plan Note (Signed)
bp in fair control at this time  BP Readings from Last 1 Encounters:  04/09/16 132/72   No changes needed Disc lifstyle change with low sodium diet and exercise  Labs reviewed  Enc exercise

## 2016-04-09 NOTE — Assessment & Plan Note (Signed)
Ref done for screening colonoscopy  She wants to wait until after Jan 1

## 2016-04-09 NOTE — Progress Notes (Signed)
Subjective:    Patient ID: Brittney Johnson, female    DOB: 1956/03/06, 60 y.o.   MRN: [id number]  HPI Here for health maintenance exam and to review chronic medical problems    Went to ENT for neck/throat discomfort Given omeprazole for her symptoms - helped and then symptoms returned off of it  Needs a px for that   Wt Readings from Last 3 Encounters:  04/09/16 162 lb 12 oz (73.8 kg)  12/05/15 164 lb 12 oz (74.7 kg)  07/04/15 163 lb 8 oz (74.2 kg)  wt is down 2 lb  Trying to eat better lately  Not as much exercise as she would like to - plans to change that  bmi 27.5  Colonoscopy 1/07 Is due for 10 year recall Wants to schedule after first of the year   Flu shot -will get today   Mammogram 2/17 negative Self breast exam - no lumps /does not check regularly   Pap 7/16 neg with neg HPV Hx of supracervical hysterectomy with L ovary remaining  No new partner No symptoms   Tetanus shot 5/12  Zoster vaccine 10/16  dexa 5/10 No falls  No fractures  Taking ca and D  Hx of low D in the past- added to problem list   bp is stable today  No cp or palpitations or headaches or edema  No side effects to medicines  BP Readings from Last 3 Encounters:  04/09/16 132/72  12/05/15 120/72  07/04/15 122/70     Hx of hyperlipidemia Lab Results  Component Value Date   CHOL 200 04/02/2016   CHOL 194 11/30/2015   CHOL 185 02/20/2015   Lab Results  Component Value Date   HDL 51.60 04/02/2016   HDL 57.90 11/30/2015   HDL 55.00 02/20/2015   Lab Results  Component Value Date   LDLCALC 118 (H) 04/02/2016   LDLCALC 100 (H) 11/30/2015   LDLCALC 109 (H) 02/20/2015   Lab Results  Component Value Date   TRIG 152.0 (H) 04/02/2016   TRIG 181.0 (H) 11/30/2015   TRIG 107.0 02/20/2015   Lab Results  Component Value Date   CHOLHDL 4 04/02/2016   CHOLHDL 3 11/30/2015   CHOLHDL 3 02/20/2015   Lab Results  Component Value Date   LDLDIRECT 160.9 03/02/2013   LDLDIRECT  152.1 02/03/2012   LDLDIRECT 128.7 05/31/2011    Overall stable with slt inc in LDL  On atorvastatin and diet  Diet is not optimal- too many burgers   Glucose 103   Patient Active Problem List   Diagnosis Date Noted  . Colon cancer screening 04/09/2016  . Vitamin D deficiency 04/09/2016  . GERD (gastroesophageal reflux disease) 04/09/2016  . Neck discomfort 12/12/2015  . Enlarged thyroid 12/05/2015  . Encounter for routine gynecological examination 02/20/2015  . Near syncope 08/05/2014  . Abnormal EKG 08/05/2014  . Anxiety 08/05/2014  . H/O cold sores 06/06/2014  . Shoulder pain, right 05/24/2014  . Abnormal TSH 02/08/2014  . Dupuytren's contracture of right hand 02/08/2014  . Low back strain 06/17/2013  . Stress reaction 03/09/2013  . Routine general medical examination at a health care facility 12/05/2010  . Other screening mammogram 12/05/2010  . Raynaud disease 12/05/2010  . Hyperlipidemia 06/29/2010  . LEUKOCYTOPENIA UNSPECIFIED 06/29/2010  . HYPERTENSION, BENIGN ESSENTIAL 06/29/2010   Past Medical History:  Diagnosis Date  . Asthma   . Atypical pneumonia 1998   not hosp  . DDD (degenerative disc disease), cervical   .  Hyperlipidemia   . Hypertension   . Syncope and collapse    Past Surgical History:  Procedure Laterality Date  . ABDOMINAL HYSTERECTOMY     supracervical- L ovary remains  . CARDIOVASCULAR STRESS TEST     08/28/06 normal   . left hand surgery Left 2016   Dupuytrens  . TUBAL LIGATION     Social History  Substance Use Topics  . Smoking status: Former Smoker    Quit date: 07/29/1997  . Smokeless tobacco: Never Used  . Alcohol use 0.0 oz/week     Comment: occ   Family History  Problem Relation Age of Onset  . Lung cancer Father   . Hyperlipidemia Mother   . Diabetes Mother   . Hypertension Mother   . Stroke Mother   . Lung cancer      uncle  . Pancreatic cancer      unspecified family member  . Diabetes      aunt  . Coronary  artery disease Maternal Grandfather   . Heart disease Other   . Thyroid disease Neg Hx    Allergies  Allergen Reactions  . Bee Venom   . Codeine     REACTION: passed out   Current Outpatient Prescriptions on File Prior to Visit  Medication Sig Dispense Refill  . albuterol (VENTOLIN HFA) 108 (90 BASE) MCG/ACT inhaler Inhale 2 puffs into the lungs every 4 (four) hours as needed for wheezing. 2 puffs prior to exercise as needed. 1 Inhaler 3  . amLODipine (NORVASC) 5 MG tablet Take 1 tablet (5 mg total) by mouth daily. 90 tablet 3  . aspirin 81 MG chewable tablet Chew 81 mg by mouth daily.      Marland Kitchen atorvastatin (LIPITOR) 20 MG tablet Take 1 tablet (20 mg total) by mouth daily. 90 tablet 3  . Calcium Carbonate-Vit D-Min 600-400 MG-UNIT TABS Take 1 tablet by mouth 2 (two) times daily.     Marland Kitchen lisinopril (PRINIVIL,ZESTRIL) 10 MG tablet TAKE 1 TABLET DAILY 90 tablet 1  . meloxicam (MOBIC) 15 MG tablet Take 1 tablet (15 mg total) by mouth daily. Take with food 90 tablet 0  . Multiple Vitamin (MULTIVITAMIN) tablet Take 1 tablet by mouth daily.    . Omega-3 Fatty Acids (FISH OIL) 1000 MG CAPS Take 1 capsule by mouth 2 (two) times daily.       No current facility-administered medications on file prior to visit.     Review of Systems    Review of Systems  Constitutional: Negative for fever, appetite change, fatigue and unexpected weight change.  Eyes: Negative for pain and visual disturbance.  ENT pos for throat discomfort Respiratory: Negative for cough and shortness of breath.   Cardiovascular: Negative for cp or palpitations   pos for Raynauds  Gastrointestinal: Negative for nausea, diarrhea and constipation.  Genitourinary: Negative for urgency and frequency.  Skin: Negative for pallor or rash   Neurological: Negative for weakness, light-headedness, numbness and headaches.  Hematological: Negative for adenopathy. Does not bruise/bleed easily.  Psychiatric/Behavioral: Negative for dysphoric  mood. The patient is not nervous/anxious.      Objective:   Physical Exam  Constitutional: She appears well-developed and well-nourished. No distress.  Well appearing   HENT:  Head: Normocephalic and atraumatic.  Right Ear: External ear normal.  Left Ear: External ear normal.  Mouth/Throat: Oropharynx is clear and moist.  Eyes: Conjunctivae and EOM are normal. Pupils are equal, round, and reactive to light. No scleral icterus.  Neck: Normal  range of motion. Neck supple. No JVD present. Carotid bruit is not present. No thyromegaly present.  Cardiovascular: Normal rate, regular rhythm, normal heart sounds and intact distal pulses.  Exam reveals no gallop.   Pulmonary/Chest: Effort normal and breath sounds normal. No respiratory distress. She has no wheezes. She exhibits no tenderness.  Abdominal: Soft. Bowel sounds are normal. She exhibits no distension, no abdominal bruit and no mass. There is no tenderness.  Genitourinary: No breast swelling, tenderness, discharge or bleeding.  Genitourinary Comments: Breast exam: No mass, nodules, thickening, tenderness, bulging, retraction, inflamation, nipple discharge or skin changes noted.  No axillary or clavicular LA.      Musculoskeletal: Normal range of motion. She exhibits no edema or tenderness.  Lymphadenopathy:    She has no cervical adenopathy.  Neurological: She is alert. She has normal reflexes. No cranial nerve deficit. She exhibits normal muscle tone. Coordination normal.  Skin: Skin is warm and dry. No rash noted. No erythema. No pallor.  Psychiatric: She has a normal mood and affect.          Assessment & Plan:   Problem List Items Addressed This Visit      Cardiovascular and Mediastinum   HYPERTENSION, BENIGN ESSENTIAL    bp in fair control at this time  BP Readings from Last 1 Encounters:  04/09/16 132/72   No changes needed Disc lifstyle change with low sodium diet and exercise  Labs reviewed  Enc exercise        Relevant Medications   atorvastatin (LIPITOR) 20 MG tablet   amLODipine (NORVASC) 5 MG tablet     Digestive   GERD (gastroesophageal reflux disease)    Will get back on omeprazole for at least 6-12 mo to get her vocal cords to heal and see if we can fix laryngeal symptoms  Disc diet      Relevant Medications   omeprazole (PRILOSEC) 40 MG capsule     Other   Vitamin D deficiency    Enc to take this daily Disc importance of this to bone and overall health       Routine general medical examination at a health care facility - Primary    Reviewed health habits including diet and exercise and skin cancer prevention Reviewed appropriate screening tests for age  Also reviewed health mt list, fam hx and immunization status , as well as social and family history    See HPI Labs reviewed Stop at check out for referral for screening colonoscopy  Take calcium with D every day once daily and then D alone 2000 iu once daily  For cholesterol     Avoid red meat/ fried foods/ egg yolks/ fatty breakfast meats/ butter, cheese and high fat dairy/ and shellfish   Flu shot today       Hyperlipidemia    Disc goals for lipids and reasons to control them Rev labs with pt Rev low sat fat diet in detail       Relevant Medications   atorvastatin (LIPITOR) 20 MG tablet   amLODipine (NORVASC) 5 MG tablet   Colon cancer screening    Ref done for screening colonoscopy  She wants to wait until after Jan 1      Relevant Orders   Ambulatory referral to Gastroenterology    Other Visit Diagnoses    Need for influenza vaccination       Relevant Orders   Flu Vaccine QUAD 36+ mos IM (Completed)

## 2016-04-09 NOTE — Progress Notes (Signed)
Pre visit review using our clinic review tool, if applicable. No additional management support is needed unless otherwise documented below in the visit note. 

## 2016-04-18 DIAGNOSIS — L812 Freckles: Secondary | ICD-10-CM | POA: Diagnosis not present

## 2016-04-18 DIAGNOSIS — L570 Actinic keratosis: Secondary | ICD-10-CM | POA: Diagnosis not present

## 2016-04-18 DIAGNOSIS — L821 Other seborrheic keratosis: Secondary | ICD-10-CM | POA: Diagnosis not present

## 2016-04-18 DIAGNOSIS — L578 Other skin changes due to chronic exposure to nonionizing radiation: Secondary | ICD-10-CM | POA: Diagnosis not present

## 2016-04-18 DIAGNOSIS — L739 Follicular disorder, unspecified: Secondary | ICD-10-CM | POA: Diagnosis not present

## 2016-05-27 ENCOUNTER — Encounter: Payer: Self-pay | Admitting: Internal Medicine

## 2016-07-14 ENCOUNTER — Other Ambulatory Visit: Payer: Self-pay | Admitting: Family Medicine

## 2016-07-30 ENCOUNTER — Ambulatory Visit (AMBULATORY_SURGERY_CENTER): Payer: Self-pay

## 2016-07-30 VITALS — Ht 64.0 in | Wt 167.8 lb

## 2016-07-30 DIAGNOSIS — Z1211 Encounter for screening for malignant neoplasm of colon: Secondary | ICD-10-CM

## 2016-07-30 MED ORDER — NA SULFATE-K SULFATE-MG SULF 17.5-3.13-1.6 GM/177ML PO SOLN: ORAL | 0 refills | Status: DC

## 2016-07-30 NOTE — Progress Notes (Signed)
Per pt, no allergies to soy or egg products.Pt not taking any weight loss meds or using  O2 at home.  Per pt, has had nausea and vomiting with some sedation. Unsure of name of sedation!

## 2016-08-07 ENCOUNTER — Encounter: Payer: Self-pay | Admitting: Internal Medicine

## 2016-08-13 ENCOUNTER — Encounter: Payer: Self-pay | Admitting: Internal Medicine

## 2016-08-13 ENCOUNTER — Ambulatory Visit (AMBULATORY_SURGERY_CENTER): Payer: BLUE CROSS/BLUE SHIELD | Admitting: Internal Medicine

## 2016-08-13 VITALS — BP 136/73 | HR 77 | Temp 98.6°F | Resp 30 | Ht 64.0 in | Wt 167.0 lb

## 2016-08-13 DIAGNOSIS — Z1211 Encounter for screening for malignant neoplasm of colon: Secondary | ICD-10-CM | POA: Diagnosis not present

## 2016-08-13 DIAGNOSIS — D123 Benign neoplasm of transverse colon: Secondary | ICD-10-CM

## 2016-08-13 DIAGNOSIS — D127 Benign neoplasm of rectosigmoid junction: Secondary | ICD-10-CM | POA: Diagnosis not present

## 2016-08-13 DIAGNOSIS — D120 Benign neoplasm of cecum: Secondary | ICD-10-CM

## 2016-08-13 DIAGNOSIS — Z1212 Encounter for screening for malignant neoplasm of rectum: Secondary | ICD-10-CM | POA: Diagnosis not present

## 2016-08-13 HISTORY — PX: COLONOSCOPY: SHX174

## 2016-08-13 MED ORDER — SODIUM CHLORIDE 0.9 % IV SOLN: 500.0000 mL | INTRAVENOUS | Status: DC

## 2016-08-13 NOTE — Progress Notes (Signed)
Called to room to assist during endoscopic procedure.  Patient ID and intended procedure confirmed with present staff. Received instructions for my participation in the procedure from the performing physician.  

## 2016-08-13 NOTE — Progress Notes (Signed)
Report to PACU, RN, vss, BBS= Clear.  

## 2016-08-13 NOTE — Patient Instructions (Signed)
YOU HAD AN ENDOSCOPIC PROCEDURE TODAY AT [facility]:   Refer to the procedure report that was given to you for any specific questions about what was found during the examination.  If the procedure report does not answer your questions, please call your gastroenterologist to clarify.  If you requested that your care partner not be given the details of your procedure findings, then the procedure report has been included in a sealed envelope for you to review at your convenience later.  YOU SHOULD EXPECT: Some feelings of bloating in the abdomen. Passage of more gas than usual.  Walking can help get rid of the air that was put into your GI tract during the procedure and reduce the bloating. If you had a lower endoscopy (such as a colonoscopy or flexible sigmoidoscopy) you may notice spotting of blood in your stool or on the toilet paper. If you underwent a bowel prep for your procedure, you may not have a normal bowel movement for a few days.  Please Note:  You might notice some irritation and congestion in your nose or some drainage.  This is from the oxygen used during your procedure.  There is no need for concern and it should clear up in a day or so.  SYMPTOMS TO REPORT IMMEDIATELY:   Following lower endoscopy (colonoscopy or flexible sigmoidoscopy):  Excessive amounts of blood in the stool  Significant tenderness or worsening of abdominal pains  Swelling of the abdomen that is new, acute  Fever of 100F or higher  For urgent or emergent issues, a gastroenterologist can be reached at any hour by calling [phone].   DIET:  We do recommend a small meal at first, but then you may proceed to your regular diet.  Drink plenty of fluids but you should avoid alcoholic beverages for 24 hours. Try to increase the fiber in your diet, and drink plenty of water.  ACTIVITY:  You should plan to take it easy for the rest of today and you should NOT DRIVE or use heavy machinery until  tomorrow (because of the sedation medicines used during the test).    FOLLOW UP: Our staff will call the number listed on your records the next business day following your procedure to check on you and address any questions or concerns that you may have regarding the information given to you following your procedure. If we do not reach you, we will leave a message.  However, if you are feeling well and you are not experiencing any problems, there is no need to return our call.  We will assume that you have returned to your regular daily activities without incident.  If any biopsies were taken you will be contacted by phone or by letter within the next 1-3 weeks.  Please call us at [phone] if you have not heard about the biopsies in 3 weeks.    SIGNATURES/CONFIDENTIALITY: You and/or your care partner have signed paperwork which will be entered into your electronic medical record.  These signatures attest to the fact that that the information above on your After Visit Summary has been reviewed and is understood.  Full responsibility of the confidentiality of this discharge information lies with you and/or your care-partner.  Dr. Hilarie Fredrickson.  Read all of the hanoduts given to you by your recovery room nurse.   Thank-you for choosing [place] for your healthcare needs today.

## 2016-08-13 NOTE — Op Note (Signed)
[facility] Patient Name: Brittney Johnson Procedure Date: 08/13/2016 10:05 AM MRN: [id number] Endoscopist: Jerene Bears , MD Age: 61 Referring MD:  Date of Birth: 05/26/1956 Gender: Female Account #: [id number] Procedure:                Colonoscopy Indications:              Screening for colorectal malignant neoplasm, Last                            colonoscopy: 2007 Medicines:                Monitored Anesthesia Care Procedure:                Pre-Anesthesia Assessment:                           - Prior to the procedure, a History and Physical                            was performed, and patient medications and                            allergies were reviewed. The patient's tolerance of                            previous anesthesia was also reviewed. The risks                            and benefits of the procedure and the sedation                            options and risks were discussed with the patient.                            All questions were answered, and informed consent                            was obtained. Prior Anticoagulants: The patient has                            taken no previous anticoagulant or antiplatelet                            agents. ASA Grade Assessment: II - A patient with                            mild systemic disease. After reviewing the risks                            and benefits, the patient was deemed in                            satisfactory condition to undergo the procedure.  After obtaining informed consent, the colonoscope                            was passed under direct vision. Throughout the                            procedure, the patient's blood pressure, pulse, and                            oxygen saturations were monitored continuously. The                            Model PCF-H190DL [id number]) scope was introduced                            through the anus and advanced to the the  cecum,                            identified by appendiceal orifice and ileocecal                            valve. The colonoscopy was performed without                            difficulty. The patient tolerated the procedure                            well. The quality of the bowel preparation was                            good. The ileocecal valve, appendiceal orifice, and                            rectum were photographed. Scope In: 10:16:10 AM Scope Out: 10:36:39 AM Scope Withdrawal Time: 0 hours 17 minutes 38 seconds  Total Procedure Duration: 0 hours 20 minutes 29 seconds  Findings:                 The digital rectal exam was normal.                           A 7 mm polyp was found in the cecum. The polyp was                            sessile. The polyp was removed with a cold snare.                            Resection and retrieval were complete.                           A 5 mm polyp was found in the distal transverse                            colon. The polyp was sessile.  The polyp was removed                            with a cold snare. Resection and retrieval were                            complete.                           A 10 mm polyp was found in the recto-sigmoid colon.                            The polyp was pedunculated. The polyp was removed                            with a hot snare. Resection and retrieval were                            complete.                           A few small-mouthed diverticula were found in the                            sigmoid colon and splenic flexure.                           Internal hemorrhoids were found during                            retroflexion. The hemorrhoids were small. Complications:            No immediate complications. Estimated Blood Loss:     Estimated blood loss was minimal. Impression:               - One 7 mm polyp in the cecum, removed with a cold                            snare. Resected and  retrieved.                           - One 5 mm polyp in the distal transverse colon,                            removed with a cold snare. Resected and retrieved.                           - One 10 mm polyp at the recto-sigmoid colon,                            removed with a hot snare. Resected and retrieved.                           - Diverticulosis in the sigmoid colon and at the  splenic flexure.                           - Internal hemorrhoids. Recommendation:           - Patient has a contact number available for                            emergencies. The signs and symptoms of potential                            delayed complications were discussed with the                            patient. Return to normal activities tomorrow.                            Written discharge instructions were provided to the                            patient.                           - Resume previous diet.                           - Continue present medications.                           - No ibuprofen, naproxen, or other non-steroidal                            anti-inflammatory drugs for 3 weeks after polyp                            removal.                           - Await pathology results.                           - Repeat colonoscopy is recommended. The                            colonoscopy date will be determined after pathology                            results from today's exam become available for                            review. Jerene Bears, MD 08/13/2016 10:40:46 AM This report has been signed electronically.

## 2016-08-14 ENCOUNTER — Telehealth: Payer: Self-pay | Admitting: *Deleted

## 2016-08-14 NOTE — Telephone Encounter (Signed)
  Follow up Call-  Call back number 08/13/2016  Post procedure Call Back phone  # [phone]  Permission to leave phone message Yes  Some recent data might be hidden     Patient questions:  Do you have a fever, pain , or abdominal swelling? No. Pain Score  0 *  Have you tolerated food without any problems? Yes.    Have you been able to return to your normal activities? Yes.    Do you have any questions about your discharge instructions: Diet   No. Medications  No. Follow up visit  No.  Do you have questions or concerns about your Care? No.  Actions: * If pain score is 4 or above: No action needed, pain <4.

## 2016-08-21 ENCOUNTER — Encounter: Payer: Self-pay | Admitting: Internal Medicine

## 2016-09-10 ENCOUNTER — Other Ambulatory Visit: Payer: Self-pay | Admitting: Family Medicine

## 2016-09-10 DIAGNOSIS — Z1231 Encounter for screening mammogram for malignant neoplasm of breast: Secondary | ICD-10-CM

## 2016-10-09 ENCOUNTER — Ambulatory Visit
Admission: RE | Admit: 2016-10-09 | Discharge: 2016-10-09 | Disposition: A | Discharge status: Home / Self Care | Payer: BLUE CROSS/BLUE SHIELD | Source: Ambulatory Visit | Attending: Family Medicine | Admitting: Family Medicine

## 2016-10-09 DIAGNOSIS — Z1231 Encounter for screening mammogram for malignant neoplasm of breast: Secondary | ICD-10-CM | POA: Diagnosis not present

## 2016-10-14 DIAGNOSIS — L578 Other skin changes due to chronic exposure to nonionizing radiation: Secondary | ICD-10-CM | POA: Diagnosis not present

## 2016-10-14 DIAGNOSIS — L570 Actinic keratosis: Secondary | ICD-10-CM | POA: Diagnosis not present

## 2017-01-08 DIAGNOSIS — L820 Inflamed seborrheic keratosis: Secondary | ICD-10-CM | POA: Diagnosis not present

## 2017-01-08 DIAGNOSIS — Z1283 Encounter for screening for malignant neoplasm of skin: Secondary | ICD-10-CM | POA: Diagnosis not present

## 2017-01-08 DIAGNOSIS — L718 Other rosacea: Secondary | ICD-10-CM | POA: Diagnosis not present

## 2017-01-08 DIAGNOSIS — L570 Actinic keratosis: Secondary | ICD-10-CM | POA: Diagnosis not present

## 2017-01-08 DIAGNOSIS — L739 Follicular disorder, unspecified: Secondary | ICD-10-CM | POA: Diagnosis not present

## 2017-01-08 DIAGNOSIS — L578 Other skin changes due to chronic exposure to nonionizing radiation: Secondary | ICD-10-CM | POA: Diagnosis not present

## 2017-01-11 ENCOUNTER — Other Ambulatory Visit: Payer: Self-pay | Admitting: Family Medicine

## 2017-02-17 ENCOUNTER — Other Ambulatory Visit: Payer: Self-pay | Admitting: *Deleted

## 2017-02-17 MED ORDER — LISINOPRIL 10 MG PO TABS: 10.0000 mg | ORAL_TABLET | Freq: Every day | ORAL | 0 refills | Status: DC

## 2017-02-17 MED ORDER — AMLODIPINE BESYLATE 5 MG PO TABS: 5.0000 mg | ORAL_TABLET | Freq: Every day | ORAL | 0 refills | Status: DC

## 2017-02-17 NOTE — Addendum Note (Signed)
Addended by: Tammi Sou on: 02/17/2017 02:36 PM   Modules accepted: Orders

## 2017-03-27 ENCOUNTER — Telehealth: Payer: Self-pay | Admitting: Family Medicine

## 2017-03-27 DIAGNOSIS — E559 Vitamin D deficiency, unspecified: Secondary | ICD-10-CM

## 2017-03-27 DIAGNOSIS — R7989 Other specified abnormal findings of blood chemistry: Secondary | ICD-10-CM

## 2017-03-27 DIAGNOSIS — E7800 Pure hypercholesterolemia, unspecified: Secondary | ICD-10-CM

## 2017-03-27 DIAGNOSIS — I10 Essential (primary) hypertension: Secondary | ICD-10-CM

## 2017-03-27 NOTE — Telephone Encounter (Signed)
-----   Message from Ellamae Sia sent at 03/27/2017  3:40 PM EDT ----- Regarding: Lab orders for Friday, 9.7.18 Patient is scheduled for CPX labs, please order future labs, Thanks , Karna Christmas

## 2017-04-04 ENCOUNTER — Other Ambulatory Visit (INDEPENDENT_AMBULATORY_CARE_PROVIDER_SITE_OTHER): Payer: BLUE CROSS/BLUE SHIELD

## 2017-04-04 DIAGNOSIS — R7989 Other specified abnormal findings of blood chemistry: Secondary | ICD-10-CM

## 2017-04-04 DIAGNOSIS — I10 Essential (primary) hypertension: Secondary | ICD-10-CM | POA: Diagnosis not present

## 2017-04-04 DIAGNOSIS — K219 Gastro-esophageal reflux disease without esophagitis: Secondary | ICD-10-CM | POA: Diagnosis not present

## 2017-04-04 DIAGNOSIS — E559 Vitamin D deficiency, unspecified: Secondary | ICD-10-CM | POA: Diagnosis not present

## 2017-04-04 DIAGNOSIS — R946 Abnormal results of thyroid function studies: Secondary | ICD-10-CM

## 2017-04-04 DIAGNOSIS — E7800 Pure hypercholesterolemia, unspecified: Secondary | ICD-10-CM | POA: Diagnosis not present

## 2017-04-04 LAB — CBC WITH DIFFERENTIAL/PLATELET
BASOS PCT: 0.9 % (ref 0.0–3.0)
Basophils Absolute: 0 10*3/uL (ref 0.0–0.1)
EOS ABS: 0 10*3/uL (ref 0.0–0.7)
EOS PCT: 0.9 % (ref 0.0–5.0)
HCT: 37.6 % (ref 36.0–46.0)
Hemoglobin: 12.5 g/dL (ref 12.0–15.0)
LYMPHS ABS: 1.8 10*3/uL (ref 0.7–4.0)
Lymphocytes Relative: 38.4 % (ref 12.0–46.0)
MCHC: 33.1 g/dL (ref 30.0–36.0)
MCV: 87.6 fl (ref 78.0–100.0)
MONO ABS: 0.5 10*3/uL (ref 0.1–1.0)
Monocytes Relative: 10.1 % (ref 3.0–12.0)
NEUTROS PCT: 49.7 % (ref 43.0–77.0)
Neutro Abs: 2.4 10*3/uL (ref 1.4–7.7)
PLATELETS: 234 10*3/uL (ref 150.0–400.0)
RBC: 4.29 Mil/uL (ref 3.87–5.11)
RDW: 14.5 % (ref 11.5–15.5)
WBC: 4.8 10*3/uL (ref 4.0–10.5)

## 2017-04-04 LAB — COMPREHENSIVE METABOLIC PANEL
ALT: 18 U/L (ref 0–35)
AST: 19 U/L (ref 0–37)
Albumin: 4.4 g/dL (ref 3.5–5.2)
Alkaline Phosphatase: 46 U/L (ref 39–117)
BUN: 16 mg/dL (ref 6–23)
CHLORIDE: 103 meq/L (ref 96–112)
CO2: 30 meq/L (ref 19–32)
Calcium: 9.8 mg/dL (ref 8.4–10.5)
Creatinine, Ser: 0.71 mg/dL (ref 0.40–1.20)
GFR: 88.24 mL/min (ref >60.00)
GLUCOSE: 98 mg/dL (ref 70–99)
Potassium: 4.7 mEq/L (ref 3.5–5.1)
SODIUM: 139 meq/L (ref 135–145)
Total Bilirubin: 0.4 mg/dL (ref 0.2–1.2)
Total Protein: 7.4 g/dL (ref 6.0–8.3)

## 2017-04-04 LAB — VITAMIN D 25 HYDROXY (VIT D DEFICIENCY, FRACTURES): VITD: 49.3 ng/mL (ref 30.00–100.00)

## 2017-04-04 LAB — T4, FREE: Free T4: 0.97 ng/dL (ref 0.60–1.60)

## 2017-04-04 LAB — LIPID PANEL
CHOL/HDL RATIO: 3
CHOLESTEROL: 185 mg/dL (ref 0–200)
HDL: 59.4 mg/dL (ref >39.00)
LDL CALC: 110 mg/dL — AB (ref 0–99)
NONHDL: 125.3
Triglycerides: 79 mg/dL (ref 0.0–149.0)
VLDL: 15.8 mg/dL (ref 0.0–40.0)

## 2017-04-04 LAB — VITAMIN B12: VITAMIN B 12: 654 pg/mL (ref 211–911)

## 2017-04-04 LAB — TSH: TSH: 1.82 u[IU]/mL (ref 0.35–4.50)

## 2017-04-04 NOTE — Addendum Note (Signed)
Addended by: Mady Haagensen on: 04/04/2017 10:48 AM   Modules accepted: Orders

## 2017-04-11 ENCOUNTER — Ambulatory Visit (INDEPENDENT_AMBULATORY_CARE_PROVIDER_SITE_OTHER): Payer: BLUE CROSS/BLUE SHIELD | Admitting: Family Medicine

## 2017-04-11 ENCOUNTER — Encounter: Payer: Self-pay | Admitting: Family Medicine

## 2017-04-11 VITALS — BP 106/64 | HR 63 | Temp 98.7°F | Ht 64.25 in | Wt 143.5 lb

## 2017-04-11 DIAGNOSIS — Z23 Encounter for immunization: Secondary | ICD-10-CM | POA: Diagnosis not present

## 2017-04-11 DIAGNOSIS — I10 Essential (primary) hypertension: Secondary | ICD-10-CM

## 2017-04-11 DIAGNOSIS — E785 Hyperlipidemia, unspecified: Secondary | ICD-10-CM

## 2017-04-11 DIAGNOSIS — E7800 Pure hypercholesterolemia, unspecified: Secondary | ICD-10-CM

## 2017-04-11 DIAGNOSIS — E559 Vitamin D deficiency, unspecified: Secondary | ICD-10-CM

## 2017-04-11 DIAGNOSIS — Z0000 Encounter for general adult medical examination without abnormal findings: Secondary | ICD-10-CM

## 2017-04-11 MED ORDER — ATORVASTATIN CALCIUM 20 MG PO TABS
20.0000 mg | ORAL_TABLET | Freq: Every day | ORAL | 3 refills | Status: DC
Start: 2017-04-11 — End: 2018-04-13

## 2017-04-11 MED ORDER — LISINOPRIL 10 MG PO TABS: 10.0000 mg | ORAL_TABLET | Freq: Every day | ORAL | 3 refills | Status: DC

## 2017-04-11 MED ORDER — AMLODIPINE BESYLATE 5 MG PO TABS
5.0000 mg | ORAL_TABLET | Freq: Every day | ORAL | 3 refills | Status: DC
Start: 2017-04-11 — End: 2018-04-13

## 2017-04-11 NOTE — Patient Instructions (Addendum)
BP is great -if it gets lower/you feel dizzy please let us know and we can cut a medicine   Try stopping omeprazole and see how you feel  If no symptoms you can stay off of it   Take care of yourself   Get back to exercise   Great job with weight loss !

## 2017-04-11 NOTE — Progress Notes (Signed)
Subjective:    Patient ID: Brittney Johnson, female    DOB: 1956/06/13, 61 y.o.   MRN: [id number]  HPI  Here for health maintenance exam and to review chronic medical problems    Had a cold this week   Doing well   Wt Readings from Last 3 Encounters:  04/11/17 143 lb 8 oz (65.1 kg)  08/13/16 167 lb (75.8 kg)  07/30/16 167 lb 12.8 oz (76.1 kg)  lost 20 lb by her scale since May  Cut out all white foods- bread/pasta/potato/sugar - and is now used to it  Changed to almond flour  Lots of produce and protein (lean)  Not exercising as much- goes to the gym in the winter  24.44 kg/m  Flu shot - wants to get today   Pap 7/16-normal with neg HPV   Mammogram 3/18 normal  Self breast exam -no lumps   colonoscopy 1/18- with 3 year recall  Has polyps   Tdap 5/12  zostavax 10/16  Vit D level 49.3- taking ca with D   dexa 5/10 - nl    bp is stable today  No cp or palpitations or headaches or edema  No side effects to medicines  BP Readings from Last 3 Encounters:  04/11/17 106/64  08/13/16 136/73  04/09/16 132/72  is on lisinopril and amlodipine   Hyperlipidemia Lab Results  Component Value Date   CHOL 185 04/04/2017   CHOL 200 04/02/2016   CHOL 194 11/30/2015   Lab Results  Component Value Date   HDL 59.40 04/04/2017   HDL 51.60 04/02/2016   HDL 57.90 11/30/2015   Lab Results  Component Value Date   LDLCALC 110 (H) 04/04/2017   LDLCALC 118 (H) 04/02/2016   LDLCALC 100 (H) 11/30/2015   Lab Results  Component Value Date   TRIG 79.0 04/04/2017   TRIG 152.0 (H) 04/02/2016   TRIG 181.0 (H) 11/30/2015   Lab Results  Component Value Date   CHOLHDL 3 04/04/2017   CHOLHDL 4 04/02/2016   CHOLHDL 3 11/30/2015   Lab Results  Component Value Date   LDLDIRECT 160.9 03/02/2013   LDLDIRECT 152.1 02/03/2012   LDLDIRECT 128.7 05/31/2011    Being good with diet  Eat red meat once per week  Overall great diet   Results for orders placed or performed in  visit on 04/04/17  CBC with Differential/Platelet  Result Value Ref Range   WBC 4.8 4.0 - 10.5 K/uL   RBC 4.29 3.87 - 5.11 Mil/uL   Hemoglobin 12.5 12.0 - 15.0 g/dL   HCT 37.6 36.0 - 46.0 %   MCV 87.6 78.0 - 100.0 fl   MCHC 33.1 30.0 - 36.0 g/dL   RDW 14.5 11.5 - 15.5 %   Platelets 234.0 150.0 - 400.0 K/uL   Neutrophils Relative % 49.7 43.0 - 77.0 %   Lymphocytes Relative 38.4 12.0 - 46.0 %   Monocytes Relative 10.1 3.0 - 12.0 %   Eosinophils Relative 0.9 0.0 - 5.0 %   Basophils Relative 0.9 0.0 - 3.0 %   Neutro Abs 2.4 1.4 - 7.7 K/uL   Lymphs Abs 1.8 0.7 - 4.0 K/uL   Monocytes Absolute 0.5 0.1 - 1.0 K/uL   Eosinophils Absolute 0.0 0.0 - 0.7 K/uL   Basophils Absolute 0.0 0.0 - 0.1 K/uL  Comprehensive metabolic panel  Result Value Ref Range   Sodium 139 135 - 145 mEq/L   Potassium 4.7 3.5 - 5.1 mEq/L   Chloride 103 96 -  112 mEq/L   CO2 30 19 - 32 mEq/L   Glucose, Bld 98 70 - 99 mg/dL   BUN 16 6 - 23 mg/dL   Creatinine, Ser 0.71 0.40 - 1.20 mg/dL   Total Bilirubin 0.4 0.2 - 1.2 mg/dL   Alkaline Phosphatase 46 39 - 117 U/L   AST 19 0 - 37 U/L   ALT 18 0 - 35 U/L   Total Protein 7.4 6.0 - 8.3 g/dL   Albumin 4.4 3.5 - 5.2 g/dL   Calcium 9.8 8.4 - 10.5 mg/dL   GFR 88.24 >60.00 mL/min  Lipid panel  Result Value Ref Range   Cholesterol 185 0 - 200 mg/dL   Triglycerides 79.0 0.0 - 149.0 mg/dL   HDL 59.40 >39.00 mg/dL   VLDL 15.8 0.0 - 40.0 mg/dL   LDL Cholesterol 110 (H) 0 - 99 mg/dL   Total CHOL/HDL Ratio 3    NonHDL 125.30   VITAMIN D 25 Hydroxy (Vit-D Deficiency, Fractures)  Result Value Ref Range   VITD 49.30 30.00 - 100.00 ng/mL  TSH  Result Value Ref Range   TSH 1.82 0.35 - 4.50 uIU/mL  T4, free  Result Value Ref Range   Free T4 0.97 0.60 - 1.60 ng/dL  Vitamin B12  Result Value Ref Range   Vitamin B-12 654 211 - 911 pg/mL    Is on omeprazole  ? If she still needs it   Patient Active Problem List   Diagnosis Date Noted  . Colon cancer screening 04/09/2016    . Vitamin D deficiency 04/09/2016  . GERD (gastroesophageal reflux disease) 04/09/2016  . Neck discomfort 12/12/2015  . Enlarged thyroid 12/05/2015  . Encounter for routine gynecological examination 02/20/2015  . Near syncope 08/05/2014  . Abnormal EKG 08/05/2014  . Anxiety 08/05/2014  . H/O cold sores 06/06/2014  . Shoulder pain, right 05/24/2014  . Abnormal TSH 02/08/2014  . Dupuytren's contracture of right hand 02/08/2014  . Low back strain 06/17/2013  . Stress reaction 03/09/2013  . Routine general medical examination at a health care facility 12/05/2010  . Other screening mammogram 12/05/2010  . Raynaud disease 12/05/2010  . Hyperlipidemia 06/29/2010  . LEUKOCYTOPENIA UNSPECIFIED 06/29/2010  . HYPERTENSION, BENIGN ESSENTIAL 06/29/2010   Past Medical History:  Diagnosis Date  . Asthma    on inhaler prn  . Atypical pneumonia 1998   not hosp  . DDD (degenerative disc disease), cervical   . Hyperlipidemia   . Hypertension   . Low back pain    on and off  . PONV (postoperative nausea and vomiting)    due to some sedations!  . Syncope and collapse 07/16/2007   Past Surgical History:  Procedure Laterality Date  . ABDOMINAL HYSTERECTOMY     supracervical- L ovary remains  . CARDIOVASCULAR STRESS TEST     08/28/06 normal   . left hand surgery Left 2016   Dupuytrens  . TUBAL LIGATION     ? 1979   Social History  Substance Use Topics  . Smoking status: Former Smoker    Quit date: 07/29/1997  . Smokeless tobacco: Never Used  . Alcohol use 0.0 oz/week     Comment: occ   Family History  Problem Relation Age of Onset  . Lung cancer Father   . Hyperlipidemia Mother   . Diabetes Mother   . Hypertension Mother   . Stroke Mother   . Heart disease Other   . Lung cancer Unknown  uncle  . Pancreatic cancer Unknown        unspecified family member  . Diabetes Unknown        aunt  . Coronary artery disease Maternal Grandfather   . Breast cancer Maternal Uncle    . Thyroid disease Neg Hx    Allergies  Allergen Reactions  . Codeine     REACTION: passed out  . Bee Venom     swelling   Current Outpatient Prescriptions on File Prior to Visit  Medication Sig Dispense Refill  . aspirin 81 MG chewable tablet Chew 81 mg by mouth daily.      . Calcium Carbonate-Vit D-Min 600-400 MG-UNIT TABS Take 1 tablet by mouth. One pill twice a day every other day / opposite days one pill daily    . Multiple Vitamin (MULTIVITAMIN) tablet Take 1 tablet by mouth daily.    . Omega-3 Fatty Acids (FISH OIL) 1000 MG CAPS Take 1 capsule by mouth 2 (two) times daily.      Marland Kitchen omeprazole (PRILOSEC) 40 MG capsule Take 1 capsule (40 mg total) by mouth daily as needed. (Patient taking differently: Take 40 mg by mouth daily. ) 90 capsule 3  . albuterol (VENTOLIN HFA) 108 (90 BASE) MCG/ACT inhaler Inhale 2 puffs into the lungs every 4 (four) hours as needed for wheezing. 2 puffs prior to exercise as needed. (Patient not taking: Reported on 04/11/2017) 1 Inhaler 3   No current facility-administered medications on file prior to visit.      Review of Systems  Constitutional: Negative for activity change, appetite change, fatigue, fever and unexpected weight change.  HENT: Negative for congestion, ear pain, rhinorrhea, sinus pressure and sore throat.   Eyes: Negative for pain, redness and visual disturbance.  Respiratory: Negative for cough, shortness of breath and wheezing.   Cardiovascular: Negative for chest pain and palpitations.  Gastrointestinal: Negative for abdominal pain, blood in stool, constipation and diarrhea.  Endocrine: Negative for polydipsia and polyuria.  Genitourinary: Negative for dysuria, frequency and urgency.  Musculoskeletal: Negative for arthralgias, back pain and myalgias.  Skin: Negative for pallor and rash.  Allergic/Immunologic: Negative for environmental allergies.  Neurological: Negative for dizziness, syncope and headaches.  Hematological: Negative  for adenopathy. Does not bruise/bleed easily.  Psychiatric/Behavioral: Negative for decreased concentration and dysphoric mood. The patient is not nervous/anxious.        Objective:   Physical Exam  Constitutional: She appears well-developed and well-nourished. No distress.  Well appearing  Wt loss noted   HENT:  Head: Normocephalic and atraumatic.  Right Ear: External ear normal.  Left Ear: External ear normal.  Mouth/Throat: Oropharynx is clear and moist.  Eyes: Pupils are equal, round, and reactive to light. Conjunctivae and EOM are normal. No scleral icterus.  Neck: Normal range of motion. Neck supple. No JVD present. Carotid bruit is not present. No thyromegaly present.  Cardiovascular: Normal rate, regular rhythm, normal heart sounds and intact distal pulses.  Exam reveals no gallop.   Pulmonary/Chest: Effort normal and breath sounds normal. No respiratory distress. She has no wheezes. She exhibits no tenderness.  Abdominal: Soft. Bowel sounds are normal. She exhibits no distension, no abdominal bruit and no mass. There is no tenderness.  Genitourinary: No breast swelling, tenderness, discharge or bleeding.  Genitourinary Comments: Breast exam: No mass, nodules, thickening, tenderness, bulging, retraction, inflamation, nipple discharge or skin changes noted.  No axillary or clavicular LA.      Musculoskeletal: Normal range of motion. She exhibits no edema  or tenderness.  Lymphadenopathy:    She has no cervical adenopathy.  Neurological: She is alert. She has normal reflexes. No cranial nerve deficit. She exhibits normal muscle tone. Coordination normal.  Skin: Skin is warm and dry. No rash noted. No erythema. No pallor.  Psychiatric: She has a normal mood and affect.          Assessment & Plan:   Problem List Items Addressed This Visit      Cardiovascular and Mediastinum   HYPERTENSION, BENIGN ESSENTIAL - Primary    bp in fair control at this time  BP Readings from Last  1 Encounters:  04/11/17 106/64   No changes needed Disc lifstyle change with low sodium diet and exercise  bp has decreased with wt loss  may be able to hold lisinopril in the future- asked to update if any symptomatic hypotension      Relevant Medications   lisinopril (PRINIVIL,ZESTRIL) 10 MG tablet   atorvastatin (LIPITOR) 20 MG tablet   amLODipine (NORVASC) 5 MG tablet     Other   Hyperlipidemia    Disc goals for lipids and reasons to control them Rev labs with pt Rev low sat fat diet in detail        Relevant Medications   lisinopril (PRINIVIL,ZESTRIL) 10 MG tablet   atorvastatin (LIPITOR) 20 MG tablet   amLODipine (NORVASC) 5 MG tablet   Routine general medical examination at a health care facility    Reviewed health habits including diet and exercise and skin cancer prevention Reviewed appropriate screening tests for age  Also reviewed health mt list, fam hx and immunization status , as well as social and family history   See HPI Labs rev  Watching bp - lower with wt loss  Commended wt loss/diet/exercise  May be able to stop ppi if symptoms are gone       Vitamin D deficiency    Vitamin D level is therapeutic with current supplementation Disc importance of this to bone and overall health        Other Visit Diagnoses    Need for influenza vaccination       Relevant Orders   Flu Vaccine QUAD 6+ mos PF IM (Fluarix Quad PF) (Completed)

## 2017-04-13 NOTE — Assessment & Plan Note (Signed)
bp in fair control at this time  BP Readings from Last 1 Encounters:  04/11/17 106/64   No changes needed Disc lifstyle change with low sodium diet and exercise  bp has decreased with wt loss  may be able to hold lisinopril in the future- asked to update if any symptomatic hypotension

## 2017-04-13 NOTE — Assessment & Plan Note (Signed)
Vitamin D level is therapeutic with current supplementation Disc importance of this to bone and overall health  

## 2017-04-13 NOTE — Assessment & Plan Note (Signed)
Reviewed health habits including diet and exercise and skin cancer prevention Reviewed appropriate screening tests for age  Also reviewed health mt list, fam hx and immunization status , as well as social and family history   See HPI Labs rev  Watching bp - lower with wt loss  Commended wt loss/diet/exercise  May be able to stop ppi if symptoms are gone

## 2017-04-13 NOTE — Assessment & Plan Note (Signed)
Disc goals for lipids and reasons to control them Rev labs with pt Rev low sat fat diet in detail   

## 2017-05-08 DIAGNOSIS — H524 Presbyopia: Secondary | ICD-10-CM | POA: Diagnosis not present

## 2017-07-08 ENCOUNTER — Ambulatory Visit: Payer: Self-pay | Admitting: *Deleted

## 2017-07-08 NOTE — Telephone Encounter (Signed)
Patient is unable to sleep or rest due to the pain- she is going to go to [facility] if she can not get into [facility] today Reason for Disposition . [1] SEVERE back pain (e.g., excruciating, unable to do any normal activities) AND [2] not improved 2 hours after pain medicine  Answer Assessment - Initial Assessment Questions 1. MECHANISM: "How did the injury happen?" (Consider the possibility of domestic violence or elder abuse)     Lifting dog 2. ONSET: "When did the injury happen?" (Minutes or hours ago)     Saturday 3. LOCATION: "What part of the back is injured?"     Lower Right side close to middle 4. SEVERITY: "Can you move the back normally?"     Could not when first injured it- now having more leg pain 5. PAIN: "Is there any pain?" If so, ask: "How bad is the pain?"   (Scale 1-10; or mild, moderate, severe)     Yes-  8 6. CORD SYMPTOMS: Any weakness or numbness of the arms or legs?"     Right leg- pain is constant 7. SIZE: For cuts, bruises, or swelling, ask: "How large is it?" (e.g., inches or centimeters)     No fall 8. TETANUS: For any breaks in the skin, ask: "When was the last tetanus booster?"     n/a 9. OTHER SYMPTOMS: "Do you have any other symptoms?" (e.g., abdominal pain, blood in urine)     no 10. PREGNANCY: "Is there any chance you are pregnant?" "When was your last menstrual period?"       n/a  Protocols used: BACK PAIN-A-AH, BACK INJURY-A-AH

## 2017-07-09 ENCOUNTER — Encounter: Payer: Self-pay | Admitting: Family Medicine

## 2017-07-09 ENCOUNTER — Ambulatory Visit: Payer: Self-pay | Admitting: *Deleted

## 2017-07-09 ENCOUNTER — Ambulatory Visit (INDEPENDENT_AMBULATORY_CARE_PROVIDER_SITE_OTHER): Payer: BLUE CROSS/BLUE SHIELD | Admitting: Family Medicine

## 2017-07-09 VITALS — BP 120/72 | HR 81 | Temp 98.0°F | Wt 146.0 lb

## 2017-07-09 DIAGNOSIS — M5416 Radiculopathy, lumbar region: Secondary | ICD-10-CM | POA: Diagnosis not present

## 2017-07-09 MED ORDER — PREDNISONE 20 MG PO TABS: ORAL_TABLET | ORAL | 0 refills | Status: DC

## 2017-07-09 MED ORDER — TRAMADOL HCL 50 MG PO TABS: 25.0000 mg | ORAL_TABLET | Freq: Two times a day (BID) | ORAL | 0 refills | Status: DC | PRN

## 2017-07-09 NOTE — Telephone Encounter (Signed)
See triage note from yesterday too.   Called in with same symptoms-back pain with pain radiating down right leg from hip to foot on the outer side.  She is taking Ibuprofen.   She has an appt with Dr. Danise Mina today at 3:00.     Reason for Disposition . [1] MODERATE pain (e.g., interferes with normal activities, limping) AND [2] present > 3 days  Answer Assessment - Initial Assessment Questions 1. ONSET: "When did the pain start?"      Pain started Saturday when she picked up her dog. 2. LOCATION: "Where is the pain located?"      Was in my lower back down right leg. 3. PAIN: "How bad is the pain?"    (Scale 1-10; or mild, moderate, severe)   -  MILD (1-3): doesn't interfere with normal activities    -  MODERATE (4-7): interferes with normal activities (e.g., work or school) or awakens from sleep, limping    -  SEVERE (8-10): excruciating pain, unable to do any normal activities, unable to walk     Yes.  8 on the pain scale.   I walking with a lot of difficulty. 4. WORK OR EXERCISE: "Has there been any recent work or exercise that involved this part of the body?"      No    I've hurt my back before and it goes away but this is the first time it's gone down my leg. 5. CAUSE: "What do you think is causing the leg pain?"     The pain is on the side of my right leg.   Goes from my hip to my foot all the way down. 6. OTHER SYMPTOMS: "Do you have any other symptoms?" (e.g., chest pain, back pain, breathing difficulty, swelling, rash, fever, numbness, weakness)     No swelling.   Side of my leg is numb on the lower side of my calf on the outside.   This started going down the bottom of my leg yesterday. 7. PREGNANCY: "Is there any chance you are pregnant?" "When was your last menstrual period?"     No  Protocols used: LEG PAIN-A-AH

## 2017-07-09 NOTE — Progress Notes (Signed)
BP 120/72 (BP Location: Left Arm, Patient Position: Sitting, Cuff Size: Normal)   Pulse 81   Temp 98 F (36.7 C) (Oral)   Wt 146 lb (66.2 kg)   LMP 07/30/2003   SpO2 99%   BMI 24.87 kg/m    CC: R back pain down to calf Subjective:    Patient ID: Brittney Johnson, female    DOB: [birth year], 61 y.o.   MRN: [id number]  HPI: Brittney Johnson is a 62 y.o. female presenting on 07/09/2017 for Back Pain (lower right side, radiates down right leg into calf. 07/05/17, pt was trying to get dog out of car and felt back "go out".  Has taken ibuprofen and old meloxicam rx, no help.)   Felt back go out Saturday 12/8 while picking up 65lb dog going to and from vet. Last 2 days noticed pain radiation down lateral right leg down to ankle, some numbness lateral lower leg. Leg feels weak. No fevers, bowel/bladder incontinence.   H/o back pain when picking up dog.  Has tried ibuprofen, topical joint freeze, heating pad. Last night took meloxicam 15mg  which didn't help.  Denies other inciting trauma or falls.   Relevant past medical, surgical, family and social history reviewed and updated as indicated. Interim medical history since our last visit reviewed. Allergies and medications reviewed and updated. Outpatient Medications Prior to Visit  Medication Sig Dispense Refill  . albuterol (VENTOLIN HFA) 108 (90 BASE) MCG/ACT inhaler Inhale 2 puffs into the lungs every 4 (four) hours as needed for wheezing. 2 puffs prior to exercise as needed. 1 Inhaler 3  . amLODipine (NORVASC) 5 MG tablet Take 1 tablet (5 mg total) by mouth daily. 90 tablet 3  . aspirin 81 MG chewable tablet Chew 81 mg by mouth daily.      Marland Kitchen atorvastatin (LIPITOR) 20 MG tablet Take 1 tablet (20 mg total) by mouth daily. 90 tablet 3  . Calcium Carbonate-Vit D-Min 600-400 MG-UNIT TABS Take 1 tablet by mouth. One pill twice a day every other day / opposite days one pill daily    . lisinopril (PRINIVIL,ZESTRIL) 10 MG tablet Take 1 tablet  (10 mg total) by mouth daily. 90 tablet 3  . Multiple Vitamin (MULTIVITAMIN) tablet Take 1 tablet by mouth daily.    . Omega-3 Fatty Acids (FISH OIL) 1000 MG CAPS Take 1 capsule by mouth 2 (two) times daily.      Marland Kitchen omeprazole (PRILOSEC) 40 MG capsule Take 1 capsule (40 mg total) by mouth daily as needed. (Patient taking differently: Take 40 mg by mouth daily. ) 90 capsule 3   No facility-administered medications prior to visit.      Per HPI unless specifically indicated in ROS section below Review of Systems     Objective:    BP 120/72 (BP Location: Left Arm, Patient Position: Sitting, Cuff Size: Normal)   Pulse 81   Temp 98 F (36.7 C) (Oral)   Wt 146 lb (66.2 kg)   LMP 07/30/2003   SpO2 99%   BMI 24.87 kg/m   Wt Readings from Last 3 Encounters:  07/09/17 146 lb (66.2 kg)  04/11/17 143 lb 8 oz (65.1 kg)  08/13/16 167 lb (75.8 kg)    Physical Exam  Constitutional: She is oriented to person, place, and time. She appears well-developed and well-nourished. No distress.  Musculoskeletal: She exhibits no edema.  No pain midline spine No paraspinous mm tenderness + SLR on right No pain with int/ext rotation at hip.  Neg FABER. No pain at SIJ, GTB or sciatic notch bilaterally.   Neurological: She is alert and oriented to person, place, and time. A sensory deficit is present.  Reflex Scores:      Patellar reflexes are 0 on the right side and 2+ on the left side.      Achilles reflexes are 2+ on the right side and 2+ on the left side. Diminished DTR R patellar tendon Diminished sensation to light touch R lateral leg around fibular head 5/5 strength BLE  Skin: Skin is warm and dry. No rash noted.  Psychiatric: She has a normal mood and affect.  Nursing note and vitals reviewed.     Assessment & Plan:   Problem List Items Addressed This Visit    Lumbar back pain with radiculopathy affecting right lower extremity - Primary    Injury to lower back after repeatedly lifting heavy  dog. Anticipate HNP with resultant compression of L4/5 nerve root based on story and exam findings. Strength seems preserved at this time - will treat conservatively with prednisone course, tramadol, rest, heating pad. Update if not improving with treatment. Low threshold for lumbar films then MRI.  Red flags for imaging reviewed.      Relevant Medications   predniSONE (DELTASONE) 20 MG tablet   traMADol (ULTRAM) 50 MG tablet       Follow up plan: Return if symptoms worsen or fail to improve.  Ria Bush, MD

## 2017-07-09 NOTE — Assessment & Plan Note (Addendum)
Injury to lower back after repeatedly lifting heavy dog. Anticipate HNP with resultant compression of L4/5 nerve root based on story and exam findings. Strength seems preserved at this time - will treat conservatively with prednisone course, tramadol, rest, heating pad. Update if not improving with treatment. Low threshold for lumbar films then MRI.  Red flags for imaging reviewed.

## 2017-07-09 NOTE — Patient Instructions (Signed)
I think you have pinched nerve from possible herniated disc.  Treat with prednisone course and tramadol for pain - mainly at night time, start with 1/2 tablet at a time.  Continue heating pad and rest.  Let us know if not improving with this.   Herniated Disk A herniated disk, also called a ruptured disk or slipped disk, occurs when a disk in the spine bulges out too far. Between the bones in the spine (vertebrae), there are oval disks that are made of a soft, spongy center that is surrounded by a tough outer ring. The disks connect your vertebrae, help your spine move, and absorb shocks from your movement. When you have a herniated disk, the spongy center of the disk bulges out or breaks through the outer ring. It can press on a nerve between the vertebrae and cause pain. This can occur anywhere in the back or neck area, but the lower back is most commonly affected. What are the causes? This condition may be caused by:  Age-related wear and tear. The spongy centers of spinal disks tend to shrink and dry out with age, which makes them more likely to herniate.  Sudden injury, such as a strain or sprain.  What increases the risk? Aging is the main risk factor for a herniated disk. Other risk factors include:  Being a man who is 18-33 years old.  Frequently doing activities that involve heavy lifting, bending, or twisting.  Frequently driving for long hours at a time.  Not getting enough exercise.  Being overweight.  Smoking.  Having a family history of back problems or herniated disks.  Being pregnant or giving birth.  Having poor nutrition.  Being tall.  What are the signs or symptoms? Symptoms may vary depending on where your herniated disk is located.  A herniated disk in the lower back may cause sharp pain in: ? Part of the arm, leg, hip, or buttocks. ? The back of the lower leg (calf). ? The lower back, spreading down through the leg into the foot (sciatica).  A  herniated disk in the neck may cause dizziness and vertigo. It may also cause pain or weakness in: ? The neck. ? The shoulder blades. ? Upper arm, forearm, or fingers.  You may also have muscle weakness. It may be difficult to: ? Lift your leg or arm. ? Stand on your toes. ? Squeeze tightly with one of your hands.  Other symptoms may include: ? Numbness or tingling in the affected areas of the hands, arms, feet, or legs. ? Inability to control when you urinate or when you have bowel movements. This is a rare but serious sign of a severe herniated disk in the lower back.  How is this diagnosed? This condition may be diagnosed based on:  Your symptoms.  Your medical history.  A physical exam. The exam may include: ? Straight-leg test. You will lie on your back while your health care provider lifts your leg, keeping your knee straight. If you feel pain, you likely have a herniated disk. ? Neurological tests. This includes checking for numbness, reflexes, muscle strength, and posture.  Imaging tests, such as: ? X-rays. ? MRI. ? CT scan. ? Electromyogram (EMG) to check the nerves that control muscles. This test may be used to determine which nerves are affected by your herniated disk.  How is this treated? Treatment for this condition may include:  A short period of rest. This is usually the first treatment. ? You may  be on bed rest for up to 2 days, or you may be instructed to stay home and avoid physical activity. ? If you have a herniated disk in your lower back, avoid sitting as much as possible. Sitting increases pressure on the disk.  Medicines. These may include: ? NSAIDs to help reduce pain and swelling. ? Muscle relaxants to prevent sudden tightening of the back muscles (back spasms). ? Prescription pain medicines, if you have severe pain.  Steroid injections in the area of the herniated disk. This can help reduce pain and swelling.  Physical therapy to strengthen  your back muscles.  In many cases, symptoms go away with treatment over a period of days or weeks. You will most likely be free of symptoms after 3-4 months. If other treatments do not help to relieve your symptoms, you may need surgery. Follow these instructions at home: Medicines  Take over-the-counter and prescription medicines only as told by your health care provider.  Do not drive or use heavy machinery while taking prescription pain medicine. Activity  Rest as directed.  After your rest period: ? Return to your normal activities and gradually begin exercising as told by your health care provider. Ask your health care provider what activities and exercises are safe for you. ? Use good posture. ? Avoid movements that cause pain. ? Do not lift anything that is heavier than 10 lb (4.5 kg) until your health care provider says this is safe. ? Do not sit or stand for long periods of time without changing positions. ? Do not sit for long periods of time without getting up and moving around.  If physical therapy was prescribed, do exercises as instructed.  Aim to strengthen muscles in your back and abdomen with exercises like crunches, swimming, or walking. General instructions  Do not use any products that contain nicotine or tobacco, such as cigarettes and e-cigarettes. These products can delay healing. If you need help quitting, ask your health care provider.  Do not wear high-heeled shoes.  Do not sleep on your belly.  If you are overweight, work with your health care provider to lose weight safely.  To prevent or treat constipation while you are taking prescription pain medicine, your health care provider may recommend that you: ? Drink enough fluid to keep your urine clear or pale yellow. ? Take over-the-counter or prescription medicines. ? Eat foods that are high in fiber, such as fresh fruits and vegetables, whole grains, and beans. ? Limit foods that are high in fat and  processed sugars, such as fried and sweet foods.  Keep all follow-up visits as told by your health care provider. This is important. How is this prevented?  Maintain a healthy weight.  Try to avoid stressful situations.  Maintain physical fitness. Do at least 150 minutes of moderate-intensity exercise each week, such as brisk walking or water aerobics.  When lifting objects: ? Keep your feet at least shoulder-width apart and tighten your abdominal muscles. ? Keep your spine neutral as you bend your knees and hips. It is important to lift using the strength of your legs, not your back. Do not lock your knees straight out. ? Always ask for help to lift heavy or awkward objects. Contact a health care provider if:  You have back pain or neck pain that does not get better after 6 weeks.  You have severe pain in your back, neck, legs, or arms.  You develop numbness, tingling, or weakness in any part of  your body. Get help right away if:  You cannot move your arms or legs.  You cannot control when you urinate or have bowel movements.  You feel dizzy or you faint.  You have shortness of breath. This information is not intended to replace advice given to you by your health care provider. Make sure you discuss any questions you have with your health care provider. Document Released: 07/12/2000 Document Revised: 03/11/2016 Document Reviewed: 03/11/2016 Elsevier Interactive Patient Education  2017 [employer].

## 2017-07-14 ENCOUNTER — Telehealth: Payer: Self-pay | Admitting: Family Medicine

## 2017-07-14 DIAGNOSIS — M5416 Radiculopathy, lumbar region: Secondary | ICD-10-CM

## 2017-07-14 NOTE — Telephone Encounter (Signed)
Copied from [place] [id number]. Topic: Quick Communication - See Telephone Encounter >> Jul 14, 2017  9:54 AM Percell Belt A wrote: CRM for notification. See Telephone encounter for: pt called in and said that she was seen last wed.  She has almost completed her round of meds and she is not any better.  She would like to know if something else could be called in for her or what else can be done?   Pharmacy - [facility] [place]    07/14/17.

## 2017-07-14 NOTE — Telephone Encounter (Signed)
How is tramadol helping pain?  I have ordered lumbar films. plz come in at her convenience for this as next step.

## 2017-07-15 ENCOUNTER — Ambulatory Visit (INDEPENDENT_AMBULATORY_CARE_PROVIDER_SITE_OTHER)
Admission: RE | Admit: 2017-07-15 | Discharge: 2017-07-15 | Disposition: A | Discharge status: Home / Self Care | Payer: BLUE CROSS/BLUE SHIELD | Source: Ambulatory Visit | Attending: Family Medicine | Admitting: Family Medicine

## 2017-07-15 DIAGNOSIS — M4807 Spinal stenosis, lumbosacral region: Secondary | ICD-10-CM | POA: Diagnosis not present

## 2017-07-15 DIAGNOSIS — M5416 Radiculopathy, lumbar region: Secondary | ICD-10-CM | POA: Diagnosis not present

## 2017-07-15 DIAGNOSIS — M5417 Radiculopathy, lumbosacral region: Secondary | ICD-10-CM | POA: Diagnosis not present

## 2017-07-15 NOTE — Telephone Encounter (Signed)
Spoke with pt, states the tramadol is helping take the edge off.  But the prednisone seems to be working but only has 1 pill left.  Says she will come in for x-rays.

## 2017-07-18 ENCOUNTER — Encounter: Payer: Self-pay | Admitting: Family Medicine

## 2017-07-18 MED ORDER — PREDNISONE 20 MG PO TABS: ORAL_TABLET | ORAL | 0 refills | Status: DC

## 2017-07-18 NOTE — Telephone Encounter (Signed)
This encounter was created in error - please disregard.

## 2017-07-18 NOTE — Telephone Encounter (Signed)
Spoke with pt relaying results and message per Dr. Darnell Level.  She says ok.

## 2017-07-18 NOTE — Addendum Note (Signed)
Addended by: Ria Bush on: 07/18/2017 02:09 PM   Modules accepted: Orders

## 2017-07-18 NOTE — Telephone Encounter (Signed)
Pt called requesting results of xray done 07/15/17. Pt states pain is slightly better but is still having back and leg pain she describes as constant and sharp rating pain 5-6/10. Pt wanted to know if she could have another round of steroids. She would appreciate a call back.

## 2017-07-18 NOTE — Telephone Encounter (Signed)
plz notify - xray showed bulging discs worse at L1/2 and L4/5 as well as some arthritic changes. We may do another prednisone course -s ent in.  If no better after this, would recommend MRI. Let us know sooner if new weakness of left leg

## 2017-07-28 ENCOUNTER — Telehealth: Payer: Self-pay | Admitting: Family Medicine

## 2017-07-28 DIAGNOSIS — M5416 Radiculopathy, lumbar region: Secondary | ICD-10-CM

## 2017-07-28 NOTE — Telephone Encounter (Signed)
Copied from [place] [id number]. Topic: Quick Communication - See Telephone Encounter >> Jul 28, 2017 10:31 AM Cleaster Corin, NT wrote: CRM for notification. See Telephone encounter for:   07/28/17. Pt. Calling to speak with Dr. Danise Mina or nurse needs to see if she needs to be scheduled for Mri 2nd dose of prednisone hasn't completley took the numbness and pain away. Pain is better but not completely gone. Pt can be reached at [phone]

## 2017-07-28 NOTE — Telephone Encounter (Signed)
Persistent numbness of right medial lower leg at shin now with some "shuffling" weakness of foot when walking.  Pain is improving. Given persistent neurological deficit, will order MR lumbar spine. Pt agrees with plan.

## 2017-07-30 NOTE — Telephone Encounter (Signed)
MRI scheduled and patient aware.

## 2017-08-06 ENCOUNTER — Ambulatory Visit
Admission: RE | Admit: 2017-08-06 | Discharge: 2017-08-06 | Disposition: A | Discharge status: Home / Self Care | Payer: BLUE CROSS/BLUE SHIELD | Source: Ambulatory Visit | Attending: Family Medicine | Admitting: Family Medicine

## 2017-08-06 ENCOUNTER — Other Ambulatory Visit: Payer: Self-pay | Admitting: Family Medicine

## 2017-08-06 DIAGNOSIS — M545 Low back pain: Secondary | ICD-10-CM | POA: Diagnosis not present

## 2017-08-06 DIAGNOSIS — M5416 Radiculopathy, lumbar region: Secondary | ICD-10-CM | POA: Diagnosis present

## 2017-08-06 DIAGNOSIS — M5116 Intervertebral disc disorders with radiculopathy, lumbar region: Secondary | ICD-10-CM | POA: Insufficient documentation

## 2017-08-06 NOTE — Progress Notes (Signed)
lumbar 

## 2017-08-19 DIAGNOSIS — Z6825 Body mass index (BMI) 25.0-25.9, adult: Secondary | ICD-10-CM | POA: Diagnosis not present

## 2017-08-19 DIAGNOSIS — I10 Essential (primary) hypertension: Secondary | ICD-10-CM | POA: Diagnosis not present

## 2017-08-19 DIAGNOSIS — M5126 Other intervertebral disc displacement, lumbar region: Secondary | ICD-10-CM | POA: Diagnosis not present

## 2017-08-26 DIAGNOSIS — Z01812 Encounter for preprocedural laboratory examination: Secondary | ICD-10-CM | POA: Diagnosis not present

## 2017-08-26 DIAGNOSIS — M5126 Other intervertebral disc displacement, lumbar region: Secondary | ICD-10-CM | POA: Diagnosis not present

## 2017-08-29 HISTORY — PX: LUMBAR DISC SURGERY: SHX700

## 2017-09-04 DIAGNOSIS — M5126 Other intervertebral disc displacement, lumbar region: Secondary | ICD-10-CM | POA: Diagnosis not present

## 2017-09-04 DIAGNOSIS — M5116 Intervertebral disc disorders with radiculopathy, lumbar region: Secondary | ICD-10-CM | POA: Diagnosis not present

## 2017-09-18 ENCOUNTER — Encounter: Payer: Self-pay | Admitting: Family Medicine

## 2017-09-24 DIAGNOSIS — M545 Low back pain: Secondary | ICD-10-CM | POA: Diagnosis not present

## 2017-09-26 DIAGNOSIS — M545 Low back pain: Secondary | ICD-10-CM | POA: Diagnosis not present

## 2017-09-29 DIAGNOSIS — M545 Low back pain: Secondary | ICD-10-CM | POA: Diagnosis not present

## 2017-10-02 DIAGNOSIS — M545 Low back pain: Secondary | ICD-10-CM | POA: Diagnosis not present

## 2017-10-06 DIAGNOSIS — M545 Low back pain: Secondary | ICD-10-CM | POA: Diagnosis not present

## 2017-10-09 DIAGNOSIS — M545 Low back pain: Secondary | ICD-10-CM | POA: Diagnosis not present

## 2017-10-13 DIAGNOSIS — M545 Low back pain: Secondary | ICD-10-CM | POA: Diagnosis not present

## 2017-10-16 ENCOUNTER — Other Ambulatory Visit: Payer: Self-pay | Admitting: Family Medicine

## 2017-10-16 DIAGNOSIS — Z1231 Encounter for screening mammogram for malignant neoplasm of breast: Secondary | ICD-10-CM

## 2017-10-16 DIAGNOSIS — M545 Low back pain: Secondary | ICD-10-CM | POA: Diagnosis not present

## 2017-10-21 DIAGNOSIS — M545 Low back pain: Secondary | ICD-10-CM | POA: Diagnosis not present

## 2017-10-23 DIAGNOSIS — M545 Low back pain: Secondary | ICD-10-CM | POA: Diagnosis not present

## 2017-10-30 DIAGNOSIS — M545 Low back pain: Secondary | ICD-10-CM | POA: Diagnosis not present

## 2017-10-31 ENCOUNTER — Ambulatory Visit
Admission: RE | Admit: 2017-10-31 | Discharge: 2017-10-31 | Disposition: A | Discharge status: Home / Self Care | Payer: BLUE CROSS/BLUE SHIELD | Source: Ambulatory Visit | Attending: Family Medicine | Admitting: Family Medicine

## 2017-10-31 DIAGNOSIS — Z1231 Encounter for screening mammogram for malignant neoplasm of breast: Secondary | ICD-10-CM | POA: Insufficient documentation

## 2017-12-09 ENCOUNTER — Encounter: Payer: Self-pay | Admitting: Family Medicine

## 2018-01-08 DIAGNOSIS — Z1283 Encounter for screening for malignant neoplasm of skin: Secondary | ICD-10-CM | POA: Diagnosis not present

## 2018-01-08 DIAGNOSIS — D1800 Hemangioma unspecified site: Secondary | ICD-10-CM | POA: Diagnosis not present

## 2018-01-08 DIAGNOSIS — L570 Actinic keratosis: Secondary | ICD-10-CM | POA: Diagnosis not present

## 2018-01-08 DIAGNOSIS — D225 Melanocytic nevi of trunk: Secondary | ICD-10-CM | POA: Diagnosis not present

## 2018-01-08 DIAGNOSIS — L720 Epidermal cyst: Secondary | ICD-10-CM | POA: Diagnosis not present

## 2018-01-13 ENCOUNTER — Encounter: Payer: Self-pay | Admitting: Family Medicine

## 2018-01-13 ENCOUNTER — Ambulatory Visit (INDEPENDENT_AMBULATORY_CARE_PROVIDER_SITE_OTHER): Payer: BLUE CROSS/BLUE SHIELD | Admitting: Family Medicine

## 2018-01-13 VITALS — BP 122/72 | HR 71 | Temp 97.8°F | Ht 64.25 in | Wt 143.5 lb

## 2018-01-13 DIAGNOSIS — M79622 Pain in left upper arm: Secondary | ICD-10-CM | POA: Diagnosis not present

## 2018-01-13 MED ORDER — PREDNISONE 10 MG PO TABS: ORAL_TABLET | ORAL | 0 refills | Status: DC

## 2018-01-13 NOTE — Progress Notes (Signed)
Subjective:    Patient ID: Brittney Johnson, female    DOB: 05-25-1956, 62 y.o.   MRN: [id number]  HPI  Here for arm pain - L side for 3 months   Had back surgery 2/7  At a f/u she asked about her arm  Her neurosurgeon disc poss of impingement - and to try nsaid and try ortho if not imp   Took ibuprofen for 2 weeks  Still hurting-fairly constant Worse in am/ when she lies down  Positions that bother- abduction and internal rotation  No throbbing  Sharp pain to move  More dull when still   Hurts in deltoid area  Below shoulder  Neck-fine   No n/t or weakness with grip   No trauma/injury  She did lie on that arm when she was recovering from her lumbar surgery   Wt Readings from Last 3 Encounters:  01/13/18 143 lb 8 oz (65.1 kg)  07/09/17 146 lb (66.2 kg)  04/11/17 143 lb 8 oz (65.1 kg)   24.44 kg/m    Patient Active Problem List   Diagnosis Date Noted  . Left upper arm pain 01/13/2018  . Colon cancer screening 04/09/2016  . Vitamin D deficiency 04/09/2016  . GERD (gastroesophageal reflux disease) 04/09/2016  . Enlarged thyroid 12/05/2015  . Encounter for routine gynecological examination 02/20/2015  . Near syncope 08/05/2014  . Abnormal EKG 08/05/2014  . Anxiety 08/05/2014  . H/O cold sores 06/06/2014  . Shoulder pain, right 05/24/2014  . Dupuytren's contracture of right hand 02/08/2014  . Stress reaction 03/09/2013  . Routine general medical examination at a health care facility 12/05/2010  . Other screening mammogram 12/05/2010  . Raynaud disease 12/05/2010  . Hyperlipidemia 06/29/2010  . HYPERTENSION, BENIGN ESSENTIAL 06/29/2010  . Lumbar back pain with radiculopathy affecting right lower extremity 06/29/2010   Past Medical History:  Diagnosis Date  . Asthma    on inhaler prn  . Atypical pneumonia 1998   not hosp  . DDD (degenerative disc disease), cervical   . Hyperlipidemia   . Hypertension   . Low back pain    on and off  . PONV  (postoperative nausea and vomiting)    due to some sedations!  . Syncope and collapse 07/16/2007   Past Surgical History:  Procedure Laterality Date  . ABDOMINAL HYSTERECTOMY     supracervical- L ovary remains  . CARDIOVASCULAR STRESS TEST     08/28/06 normal   . left hand surgery Left 2016   Dupuytrens  . LUMBAR DISC SURGERY  08/2017   [facility]  . OOPHORECTOMY Right    Right ovary removed   . TUBAL LIGATION     ? 1979   Social History   Tobacco Use  . Smoking status: Former Smoker    Last attempt to quit: 07/29/1997    Years since quitting: 20.4  . Smokeless tobacco: Never Used  Substance Use Topics  . Alcohol use: Yes    Alcohol/week: 0.0 oz    Comment: occ  . Drug use: No   Family History  Problem Relation Age of Onset  . Lung cancer Father   . Hyperlipidemia Mother   . Diabetes Mother   . Hypertension Mother   . Stroke Mother   . Heart disease Other   . Lung cancer Unknown        uncle  . Pancreatic cancer Unknown        unspecified family member  . Diabetes Unknown  aunt  . Coronary artery disease Maternal Grandfather   . Breast cancer Maternal Aunt   . Thyroid disease Neg Hx    Allergies  Allergen Reactions  . Codeine     REACTION: passed out  . Bee Venom     swelling   Current Outpatient Medications on File Prior to Visit  Medication Sig Dispense Refill  . albuterol (VENTOLIN HFA) 108 (90 BASE) MCG/ACT inhaler Inhale 2 puffs into the lungs every 4 (four) hours as needed for wheezing. 2 puffs prior to exercise as needed. 1 Inhaler 3  . amLODipine (NORVASC) 5 MG tablet Take 1 tablet (5 mg total) by mouth daily. 90 tablet 3  . aspirin 81 MG chewable tablet Chew 81 mg by mouth daily.      Marland Kitchen atorvastatin (LIPITOR) 20 MG tablet Take 1 tablet (20 mg total) by mouth daily. 90 tablet 3  . Calcium Carbonate-Vit D-Min 600-400 MG-UNIT TABS Take 1 tablet by mouth. One pill twice a day every other day / opposite days one pill daily    . lisinopril  (PRINIVIL,ZESTRIL) 10 MG tablet Take 1 tablet (10 mg total) by mouth daily. 90 tablet 3  . Multiple Vitamin (MULTIVITAMIN) tablet Take 1 tablet by mouth daily.    . Omega-3 Fatty Acids (FISH OIL) 1000 MG CAPS Take 1 capsule by mouth 2 (two) times daily.       No current facility-administered medications on file prior to visit.     Review of Systems  Constitutional: Negative for activity change, appetite change, fatigue, fever and unexpected weight change.  HENT: Negative for congestion, ear pain, rhinorrhea, sinus pressure and sore throat.   Eyes: Negative for pain, redness and visual disturbance.  Respiratory: Negative for cough, shortness of breath and wheezing.   Cardiovascular: Negative for chest pain and palpitations.  Gastrointestinal: Negative for abdominal pain, blood in stool, constipation and diarrhea.  Endocrine: Negative for polydipsia and polyuria.  Genitourinary: Negative for dysuria, frequency and urgency.  Musculoskeletal: Negative for arthralgias, back pain and myalgias.       R upper arm pain   Skin: Negative for pallor and rash.  Allergic/Immunologic: Negative for environmental allergies.  Neurological: Negative for dizziness, syncope and headaches.  Hematological: Negative for adenopathy. Does not bruise/bleed easily.  Psychiatric/Behavioral: Negative for decreased concentration and dysphoric mood. The patient is not nervous/anxious.        Objective:   Physical Exam  Constitutional: She appears well-developed and well-nourished. No distress.  Well appearing   HENT:  Head: Normocephalic and atraumatic.  Eyes: Pupils are equal, round, and reactive to light. Conjunctivae and EOM are normal.  Neck: Normal range of motion. Neck supple.  Cardiovascular: Normal rate, regular rhythm and normal heart sounds.  Pulmonary/Chest: Effort normal and breath sounds normal. No stridor. No respiratory distress. She has no wheezes.  Musculoskeletal:       Left shoulder: She  exhibits decreased range of motion and tenderness. She exhibits no bony tenderness, no swelling, no effusion, no crepitus, no deformity, normal pulse and normal strength.  L shoulder/upper arm Pain on internal rotation and extension  Pos hawking/ neg neer tests  No crepitus  No acromion tenderness Tender over upper deltoid/bicep No neuro changes   Lymphadenopathy:    She has no cervical adenopathy.  Neurological: She displays no atrophy. No sensory deficit. She exhibits normal muscle tone. Coordination normal.  Skin: Skin is warm and dry. No erythema.  Psychiatric: She has a normal mood and affect.  Assessment & Plan:   Problem List Items Addressed This Visit      Other   Left upper arm pain - Primary    Suspect tendonitis from lying on it after lumbar surgery  Some imp with nsaid Will switch to a prednisone taper for 9 days  Disc poss side eff Ice -10 min when poss Shoulder rom exercises but avoid heavy lifting If no imp consider sport med consult

## 2018-01-13 NOTE — Assessment & Plan Note (Signed)
Suspect tendonitis from lying on it after lumbar surgery  Some imp with nsaid Will switch to a prednisone taper for 9 days  Disc poss side eff Ice -10 min when poss Shoulder rom exercises but avoid heavy lifting If no imp consider sport med consult

## 2018-01-13 NOTE — Patient Instructions (Signed)
I want you to ice the painful area 10 minutes whenever you can   Do the range of motion exercises to keep it from stiffening up   Don't lie on that side if you can help it   Take the prednisone as directed  Update me when you are done   If not improving I would consider sport medicine here

## 2018-04-05 ENCOUNTER — Telehealth: Payer: Self-pay | Admitting: Family Medicine

## 2018-04-05 DIAGNOSIS — E559 Vitamin D deficiency, unspecified: Secondary | ICD-10-CM

## 2018-04-05 DIAGNOSIS — E7800 Pure hypercholesterolemia, unspecified: Secondary | ICD-10-CM

## 2018-04-05 DIAGNOSIS — I10 Essential (primary) hypertension: Secondary | ICD-10-CM

## 2018-04-05 NOTE — Telephone Encounter (Signed)
-----   Message from Lendon Collar, RT sent at 04/01/2018  6:24 PM EDT ----- Regarding: Lab orders for Thursday 04/09/18 Please enter CPE lab orders for 04/09/18. Thanks-Lauren

## 2018-04-09 ENCOUNTER — Other Ambulatory Visit (INDEPENDENT_AMBULATORY_CARE_PROVIDER_SITE_OTHER): Payer: BLUE CROSS/BLUE SHIELD

## 2018-04-09 DIAGNOSIS — E559 Vitamin D deficiency, unspecified: Secondary | ICD-10-CM | POA: Diagnosis not present

## 2018-04-09 DIAGNOSIS — I10 Essential (primary) hypertension: Secondary | ICD-10-CM | POA: Diagnosis not present

## 2018-04-09 DIAGNOSIS — E7800 Pure hypercholesterolemia, unspecified: Secondary | ICD-10-CM

## 2018-04-09 LAB — CBC WITH DIFFERENTIAL/PLATELET
BASOS PCT: 1 % (ref 0.0–3.0)
Basophils Absolute: 0 10*3/uL (ref 0.0–0.1)
EOS ABS: 0.1 10*3/uL (ref 0.0–0.7)
EOS PCT: 1.6 % (ref 0.0–5.0)
HEMATOCRIT: 36.7 % (ref 36.0–46.0)
HEMOGLOBIN: 12.4 g/dL (ref 12.0–15.0)
LYMPHS PCT: 48.2 % — AB (ref 12.0–46.0)
Lymphs Abs: 2 10*3/uL (ref 0.7–4.0)
MCHC: 33.8 g/dL (ref 30.0–36.0)
MCV: 87.3 fl (ref 78.0–100.0)
Monocytes Absolute: 0.4 10*3/uL (ref 0.1–1.0)
Monocytes Relative: 10.4 % (ref 3.0–12.0)
Neutro Abs: 1.6 10*3/uL (ref 1.4–7.7)
Neutrophils Relative %: 38.8 % — ABNORMAL LOW (ref 43.0–77.0)
Platelets: 210 10*3/uL (ref 150.0–400.0)
RBC: 4.2 Mil/uL (ref 3.87–5.11)
RDW: 13.7 % (ref 11.5–15.5)
WBC: 4.1 10*3/uL (ref 4.0–10.5)

## 2018-04-09 LAB — COMPREHENSIVE METABOLIC PANEL
ALBUMIN: 4.4 g/dL (ref 3.5–5.2)
ALT: 15 U/L (ref 0–35)
AST: 13 U/L (ref 0–37)
Alkaline Phosphatase: 45 U/L (ref 39–117)
BUN: 17 mg/dL (ref 6–23)
CALCIUM: 9.8 mg/dL (ref 8.4–10.5)
CHLORIDE: 104 meq/L (ref 96–112)
CO2: 31 mEq/L (ref 19–32)
Creatinine, Ser: 0.76 mg/dL (ref 0.40–1.20)
GFR: 81.31 mL/min (ref >60.00)
Glucose, Bld: 94 mg/dL (ref 70–99)
Potassium: 4.2 mEq/L (ref 3.5–5.1)
Sodium: 140 mEq/L (ref 135–145)
Total Bilirubin: 0.5 mg/dL (ref 0.2–1.2)
Total Protein: 7.3 g/dL (ref 6.0–8.3)

## 2018-04-09 LAB — LIPID PANEL
CHOLESTEROL: 181 mg/dL (ref 0–200)
HDL: 61 mg/dL (ref >39.00)
LDL CALC: 100 mg/dL — AB (ref 0–99)
NonHDL: 120.18
TRIGLYCERIDES: 101 mg/dL (ref 0.0–149.0)
Total CHOL/HDL Ratio: 3
VLDL: 20.2 mg/dL (ref 0.0–40.0)

## 2018-04-09 LAB — VITAMIN D 25 HYDROXY (VIT D DEFICIENCY, FRACTURES): VITD: 36.02 ng/mL (ref 30.00–100.00)

## 2018-04-09 LAB — TSH: TSH: 1.29 u[IU]/mL (ref 0.35–4.50)

## 2018-04-13 ENCOUNTER — Other Ambulatory Visit (HOSPITAL_COMMUNITY)
Admission: RE | Admit: 2018-04-13 | Discharge: 2018-04-13 | Disposition: A | Discharge status: Home / Self Care | Payer: BLUE CROSS/BLUE SHIELD | Source: Ambulatory Visit | Attending: Family Medicine | Admitting: Family Medicine

## 2018-04-13 ENCOUNTER — Ambulatory Visit (INDEPENDENT_AMBULATORY_CARE_PROVIDER_SITE_OTHER): Payer: BLUE CROSS/BLUE SHIELD | Admitting: Family Medicine

## 2018-04-13 ENCOUNTER — Encounter: Payer: Self-pay | Admitting: Family Medicine

## 2018-04-13 VITALS — BP 106/64 | HR 66 | Temp 98.0°F | Ht 64.25 in | Wt 142.0 lb

## 2018-04-13 DIAGNOSIS — E559 Vitamin D deficiency, unspecified: Secondary | ICD-10-CM

## 2018-04-13 DIAGNOSIS — Z0000 Encounter for general adult medical examination without abnormal findings: Secondary | ICD-10-CM | POA: Diagnosis not present

## 2018-04-13 DIAGNOSIS — G4762 Sleep related leg cramps: Secondary | ICD-10-CM | POA: Insufficient documentation

## 2018-04-13 DIAGNOSIS — Z23 Encounter for immunization: Secondary | ICD-10-CM | POA: Diagnosis not present

## 2018-04-13 DIAGNOSIS — I10 Essential (primary) hypertension: Secondary | ICD-10-CM | POA: Diagnosis not present

## 2018-04-13 DIAGNOSIS — Z90711 Acquired absence of uterus with remaining cervical stump: Secondary | ICD-10-CM | POA: Diagnosis not present

## 2018-04-13 DIAGNOSIS — Z01419 Encounter for gynecological examination (general) (routine) without abnormal findings: Secondary | ICD-10-CM | POA: Insufficient documentation

## 2018-04-13 DIAGNOSIS — E7800 Pure hypercholesterolemia, unspecified: Secondary | ICD-10-CM

## 2018-04-13 MED ORDER — LISINOPRIL 10 MG PO TABS: 10.0000 mg | ORAL_TABLET | Freq: Every day | ORAL | 3 refills | Status: DC

## 2018-04-13 MED ORDER — ATORVASTATIN CALCIUM 20 MG PO TABS: 20.0000 mg | ORAL_TABLET | Freq: Every day | ORAL | 3 refills | Status: DC

## 2018-04-13 MED ORDER — AMLODIPINE BESYLATE 5 MG PO TABS: 5.0000 mg | ORAL_TABLET | Freq: Every day | ORAL | 3 refills | Status: DC

## 2018-04-13 NOTE — Assessment & Plan Note (Signed)
Vitamin D level is therapeutic with current supplementation Disc importance of this to bone and overall health Level in mid 30s Continue taking it

## 2018-04-13 NOTE — Assessment & Plan Note (Signed)
Reviewed health habits including diet and exercise and skin cancer prevention Reviewed appropriate screening tests for age  Also reviewed health mt list, fam hx and immunization status , as well as social and family history   See HPI Labs rev Flu shot today  Gyn exam today

## 2018-04-13 NOTE — Progress Notes (Signed)
Subjective:    Patient ID: Brittney Johnson, female    DOB: 1956/02/28, 62 y.o.   MRN: [id number]  HPI Here for health maintenance exam and to review chronic medical problems   Had back surgery this year   Feeling good overall    Wt Readings from Last 3 Encounters:  04/13/18 142 lb (64.4 kg)  01/13/18 143 lb 8 oz (65.1 kg)  07/09/17 146 lb (66.2 kg)  mt her weight with diet  24.18 kg/m   Gets leg cramps  Eating better also   Not planning retirement for a while    Mostly at night  Eats banana a day   Had stand up desk now   Pap 7/16 nl with neg HPV Has had supracervical hysterectomy   Flu shot-will have today   Colonoscopy 1/18 - 3 year recall /polyps   Mammogram 4/19 Self breast exam -no lumps or changes   Tetanus vaccine 5/12  zostavax 10/16  dexa 5/10 normal D level is 36 No falls or broken bones this year   bp is stable today  No cp or palpitations or headaches or edema  No side effects to medicines  BP Readings from Last 3 Encounters:  04/13/18 106/64  01/13/18 122/72  07/09/17 120/72     Hyperlipidemia Lab Results  Component Value Date   CHOL 181 04/09/2018   CHOL 185 04/04/2017   CHOL 200 04/02/2016   Lab Results  Component Value Date   HDL 61.00 04/09/2018   HDL 59.40 04/04/2017   HDL 51.60 04/02/2016   Lab Results  Component Value Date   LDLCALC 100 (H) 04/09/2018   LDLCALC 110 (H) 04/04/2017   LDLCALC 118 (H) 04/02/2016   Lab Results  Component Value Date   TRIG 101.0 04/09/2018   TRIG 79.0 04/04/2017   TRIG 152.0 (H) 04/02/2016   Lab Results  Component Value Date   CHOLHDL 3 04/09/2018   CHOLHDL 3 04/04/2017   CHOLHDL 4 04/02/2016   Lab Results  Component Value Date   LDLDIRECT 160.9 03/02/2013   LDLDIRECT 152.1 02/03/2012   LDLDIRECT 128.7 05/31/2011   On statin and diet  LDL is down   Other labs: Results for orders placed or performed in visit on 04/09/18  VITAMIN D 25 Hydroxy (Vit-D Deficiency,  Fractures)  Result Value Ref Range   VITD 36.02 30.00 - 100.00 ng/mL  TSH  Result Value Ref Range   TSH 1.29 0.35 - 4.50 uIU/mL  Lipid panel  Result Value Ref Range   Cholesterol 181 0 - 200 mg/dL   Triglycerides 101.0 0.0 - 149.0 mg/dL   HDL 61.00 >39.00 mg/dL   VLDL 20.2 0.0 - 40.0 mg/dL   LDL Cholesterol 100 (H) 0 - 99 mg/dL   Total CHOL/HDL Ratio 3    NonHDL 120.18   Comprehensive metabolic panel  Result Value Ref Range   Sodium 140 135 - 145 mEq/L   Potassium 4.2 3.5 - 5.1 mEq/L   Chloride 104 96 - 112 mEq/L   CO2 31 19 - 32 mEq/L   Glucose, Bld 94 70 - 99 mg/dL   BUN 17 6 - 23 mg/dL   Creatinine, Ser 0.76 0.40 - 1.20 mg/dL   Total Bilirubin 0.5 0.2 - 1.2 mg/dL   Alkaline Phosphatase 45 39 - 117 U/L   AST 13 0 - 37 U/L   ALT 15 0 - 35 U/L   Total Protein 7.3 6.0 - 8.3 g/dL   Albumin 4.4 3.5 -  5.2 g/dL   Calcium 9.8 8.4 - 10.5 mg/dL   GFR 81.31 >60.00 mL/min  CBC with Differential/Platelet  Result Value Ref Range   WBC 4.1 4.0 - 10.5 K/uL   RBC 4.20 3.87 - 5.11 Mil/uL   Hemoglobin 12.4 12.0 - 15.0 g/dL   HCT 36.7 36.0 - 46.0 %   MCV 87.3 78.0 - 100.0 fl   MCHC 33.8 30.0 - 36.0 g/dL   RDW 13.7 11.5 - 15.5 %   Platelets 210.0 150.0 - 400.0 K/uL   Neutrophils Relative % 38.8 (L) 43.0 - 77.0 %   Lymphocytes Relative 48.2 (H) 12.0 - 46.0 %   Monocytes Relative 10.4 3.0 - 12.0 %   Eosinophils Relative 1.6 0.0 - 5.0 %   Basophils Relative 1.0 0.0 - 3.0 %   Neutro Abs 1.6 1.4 - 7.7 K/uL   Lymphs Abs 2.0 0.7 - 4.0 K/uL   Monocytes Absolute 0.4 0.1 - 1.0 K/uL   Eosinophils Absolute 0.1 0.0 - 0.7 K/uL   Basophils Absolute 0.0 0.0 - 0.1 K/uL     Patient Active Problem List   Diagnosis Date Noted  . Nocturnal leg cramps 04/13/2018  . Colon cancer screening 04/09/2016  . Vitamin D deficiency 04/09/2016  . GERD (gastroesophageal reflux disease) 04/09/2016  . Enlarged thyroid 12/05/2015  . Encounter for routine gynecological examination 02/20/2015  . Near syncope  08/05/2014  . Abnormal EKG 08/05/2014  . Anxiety 08/05/2014  . H/O cold sores 06/06/2014  . Dupuytren's contracture of right hand 02/08/2014  . Stress reaction 03/09/2013  . Routine general medical examination at a health care facility 12/05/2010  . Other screening mammogram 12/05/2010  . Raynaud disease 12/05/2010  . Hyperlipidemia 06/29/2010  . HYPERTENSION, BENIGN ESSENTIAL 06/29/2010  . Lumbar back pain with radiculopathy affecting right lower extremity 06/29/2010   Past Medical History:  Diagnosis Date  . Asthma    on inhaler prn  . Atypical pneumonia 1998   not hosp  . DDD (degenerative disc disease), cervical   . Hyperlipidemia   . Hypertension   . Low back pain    on and off  . PONV (postoperative nausea and vomiting)    due to some sedations!  . Syncope and collapse 07/16/2007   Past Surgical History:  Procedure Laterality Date  . ABDOMINAL HYSTERECTOMY     supracervical- L ovary remains  . CARDIOVASCULAR STRESS TEST     08/28/06 normal   . left hand surgery Left 2016   Dupuytrens  . LUMBAR DISC SURGERY  08/2017   [facility]  . OOPHORECTOMY Right    Right ovary removed   . TUBAL LIGATION     ? 1979   Social History   Tobacco Use  . Smoking status: Former Smoker    Last attempt to quit: 07/29/1997    Years since quitting: 20.7  . Smokeless tobacco: Never Used  Substance Use Topics  . Alcohol use: Yes    Alcohol/week: 0.0 standard drinks    Comment: occ  . Drug use: No   Family History  Problem Relation Age of Onset  . Lung cancer Father   . Hyperlipidemia Mother   . Diabetes Mother   . Hypertension Mother   . Stroke Mother   . Heart disease Other   . Lung cancer Unknown        uncle  . Pancreatic cancer Unknown        unspecified family member  . Diabetes Unknown  aunt  . Coronary artery disease Maternal Grandfather   . Breast cancer Maternal Aunt   . Thyroid disease Neg Hx    Allergies  Allergen Reactions  . Codeine     REACTION:  passed out  . Bee Venom     swelling   Current Outpatient Medications on File Prior to Visit  Medication Sig Dispense Refill  . albuterol (VENTOLIN HFA) 108 (90 BASE) MCG/ACT inhaler Inhale 2 puffs into the lungs every 4 (four) hours as needed for wheezing. 2 puffs prior to exercise as needed. 1 Inhaler 3  . Calcium Carbonate-Vit D-Min 600-400 MG-UNIT TABS Take 1 tablet by mouth. One pill twice a day every other day / opposite days one pill daily    . Multiple Vitamin (MULTIVITAMIN) tablet Take 1 tablet by mouth daily.    . Omega-3 Fatty Acids (FISH OIL) 1000 MG CAPS Take 1 capsule by mouth 2 (two) times daily.       No current facility-administered medications on file prior to visit.      Review of Systems  Constitutional: Negative for activity change, appetite change, fatigue, fever and unexpected weight change.  HENT: Negative for congestion, ear pain, rhinorrhea, sinus pressure and sore throat.   Eyes: Negative for pain, redness and visual disturbance.  Respiratory: Negative for cough, shortness of breath and wheezing.   Cardiovascular: Negative for chest pain and palpitations.  Gastrointestinal: Negative for abdominal pain, blood in stool, constipation and diarrhea.  Endocrine: Negative for polydipsia and polyuria.  Genitourinary: Negative for dysuria, frequency and urgency.  Musculoskeletal: Negative for arthralgias, back pain and myalgias.       Nocturnal muscle cramps  Skin: Negative for pallor and rash.  Allergic/Immunologic: Negative for environmental allergies.  Neurological: Negative for dizziness, syncope and headaches.  Hematological: Negative for adenopathy. Does not bruise/bleed easily.  Psychiatric/Behavioral: Negative for decreased concentration and dysphoric mood. The patient is not nervous/anxious.        Objective:   Physical Exam  Constitutional: She appears well-developed and well-nourished. No distress.  Well appearing   HENT:  Head: Normocephalic and  atraumatic.  Right Ear: External ear normal.  Left Ear: External ear normal.  Mouth/Throat: Oropharynx is clear and moist.  Eyes: Pupils are equal, round, and reactive to light. Conjunctivae and EOM are normal. No scleral icterus.  Neck: Normal range of motion. Neck supple. No JVD present. Carotid bruit is not present.  Baseline large thyroid   Cardiovascular: Normal rate, regular rhythm, normal heart sounds and intact distal pulses. Exam reveals no gallop.  Pulmonary/Chest: Effort normal and breath sounds normal. No respiratory distress. She has no wheezes. She exhibits no tenderness. No breast tenderness, discharge or bleeding.  Abdominal: Soft. Bowel sounds are normal. She exhibits no distension, no abdominal bruit and no mass. There is no tenderness.  Genitourinary: No breast tenderness, discharge or bleeding.  Genitourinary Comments: Breast exam: No mass, nodules, thickening, tenderness, bulging, retraction, inflamation, nipple discharge or skin changes noted.  No axillary or clavicular LA.             Anus appears normal w/o hemorrhoids or masses     External genitalia : nl appearance and hair distribution/no lesions     Urethral meatus : nl size, no lesions or prolapse     Urethra: no masses, tenderness or scarring    Bladder : no masses or tenderness     Vagina: nl general appearance, no discharge or  Lesions, no significant cystocele  or rectocele  Cervix: no lesions/ discharge or friability     Uterus: nl size, contour, position, and mobility (not fixed) , non tender    Adnexa : no masses, tenderness, enlargement or nodularity        Musculoskeletal: Normal range of motion. She exhibits no edema or tenderness.  Lymphadenopathy:    She has no cervical adenopathy.  Neurological: She is alert. She has normal reflexes. She displays normal reflexes. No cranial nerve deficit. She exhibits normal muscle tone. Coordination normal.  Skin: Skin is warm and dry.  No rash noted. No erythema. No pallor.  Solar lentigines diffusely   Psychiatric: She has a normal mood and affect.          Assessment & Plan:   Problem List Items Addressed This Visit      Cardiovascular and Mediastinum   HYPERTENSION, BENIGN ESSENTIAL    bp in fair control at this time  BP Readings from Last 1 Encounters:  04/13/18 106/64   No changes needed Most recent labs reviewed  Disc lifstyle change with low sodium diet and exercise        Relevant Medications   amLODipine (NORVASC) 5 MG tablet   atorvastatin (LIPITOR) 20 MG tablet   lisinopril (PRINIVIL,ZESTRIL) 10 MG tablet     Other   Encounter for routine gynecological examination    Nl exam- pelvic /breast  Pap obtained  No c/o      Relevant Orders   Cytology - PAP   Hyperlipidemia    Disc goals for lipids and reasons to control them Rev last labs with pt Rev low sat fat diet in detail LDL is improved  Continue atorvastatin and diet       Relevant Medications   amLODipine (NORVASC) 5 MG tablet   atorvastatin (LIPITOR) 20 MG tablet   lisinopril (PRINIVIL,ZESTRIL) 10 MG tablet   Nocturnal leg cramps    Suggested stretching/ yoga Nl labs Try magnesium 250-500 mg daily      Routine general medical examination at a health care facility - Primary    Reviewed health habits including diet and exercise and skin cancer prevention Reviewed appropriate screening tests for age  Also reviewed health mt list, fam hx and immunization status , as well as social and family history   See HPI Labs rev Flu shot today  Gyn exam today       Vitamin D deficiency    Vitamin D level is therapeutic with current supplementation Disc importance of this to bone and overall health Level in mid 30s Continue taking it

## 2018-04-13 NOTE — Assessment & Plan Note (Signed)
Disc goals for lipids and reasons to control them Rev last labs with pt Rev low sat fat diet in detail LDL is improved  Continue atorvastatin and diet

## 2018-04-13 NOTE — Patient Instructions (Addendum)
Take 250 -500 mg of magnesium per day  Do stretch as well  Yoga is helpful   Pap /pelvic exam today  See dermatology yearly   Work in exercise the best you can  Keep eating a healthy diet

## 2018-04-13 NOTE — Assessment & Plan Note (Signed)
Nl exam- pelvic /breast  Pap obtained  No c/o

## 2018-04-13 NOTE — Assessment & Plan Note (Signed)
bp in fair control at this time  BP Readings from Last 1 Encounters:  04/13/18 106/64   No changes needed Most recent labs reviewed  Disc lifstyle change with low sodium diet and exercise

## 2018-04-13 NOTE — Assessment & Plan Note (Signed)
Suggested stretching/ yoga Nl labs Try magnesium 250-500 mg daily

## 2018-04-16 LAB — CYTOLOGY - PAP
DIAGNOSIS: NEGATIVE
HPV: NOT DETECTED

## 2018-04-30 DIAGNOSIS — H16142 Punctate keratitis, left eye: Secondary | ICD-10-CM | POA: Diagnosis not present

## 2018-09-29 ENCOUNTER — Other Ambulatory Visit: Payer: Self-pay | Admitting: Family Medicine

## 2018-09-29 DIAGNOSIS — Z1231 Encounter for screening mammogram for malignant neoplasm of breast: Secondary | ICD-10-CM

## 2019-01-22 ENCOUNTER — Ambulatory Visit
Admission: RE | Admit: 2019-01-22 | Discharge: 2019-01-22 | Disposition: A | Discharge status: Home / Self Care | Payer: BC Managed Care – PPO | Source: Ambulatory Visit | Attending: Family Medicine | Admitting: Family Medicine

## 2019-01-22 ENCOUNTER — Other Ambulatory Visit: Payer: Self-pay

## 2019-01-22 DIAGNOSIS — Z1231 Encounter for screening mammogram for malignant neoplasm of breast: Secondary | ICD-10-CM

## 2019-01-25 ENCOUNTER — Telehealth: Payer: Self-pay | Admitting: Family Medicine

## 2019-01-25 ENCOUNTER — Other Ambulatory Visit: Payer: Self-pay | Admitting: Family Medicine

## 2019-01-25 DIAGNOSIS — R928 Other abnormal and inconclusive findings on diagnostic imaging of breast: Secondary | ICD-10-CM

## 2019-01-25 DIAGNOSIS — N6489 Other specified disorders of breast: Secondary | ICD-10-CM

## 2019-01-25 NOTE — Telephone Encounter (Signed)
Patient called back in regards to mammogram she had done. She stated that they told her they would be sending over the results to her PCP and that they wanted to do some further testing based on what they had seen. Patient hasn't heard from anyone and is a little concerned on what needs to happen next.   Patient's C/B # [phone]

## 2019-01-25 NOTE — Telephone Encounter (Signed)
It looks like I signed the order this morning  so they ([facility])  should be calling her to set up imaging soon ( she can also call them directly if she does not hear from [facility] today ) Thanks  Keep me posted

## 2019-01-25 NOTE — Telephone Encounter (Signed)
Pt notified order signed and she can call back to schedule appt or she can wait for the imaging center to call her directly

## 2019-02-03 ENCOUNTER — Ambulatory Visit
Admission: RE | Admit: 2019-02-03 | Discharge: 2019-02-03 | Disposition: A | Discharge status: Home / Self Care | Payer: BC Managed Care – PPO | Source: Ambulatory Visit | Attending: Family Medicine | Admitting: Family Medicine

## 2019-02-03 DIAGNOSIS — R928 Other abnormal and inconclusive findings on diagnostic imaging of breast: Secondary | ICD-10-CM

## 2019-02-03 DIAGNOSIS — N6489 Other specified disorders of breast: Secondary | ICD-10-CM | POA: Diagnosis not present

## 2019-04-15 ENCOUNTER — Telehealth: Payer: Self-pay | Admitting: Family Medicine

## 2019-04-15 DIAGNOSIS — E559 Vitamin D deficiency, unspecified: Secondary | ICD-10-CM

## 2019-04-15 DIAGNOSIS — E7800 Pure hypercholesterolemia, unspecified: Secondary | ICD-10-CM

## 2019-04-15 DIAGNOSIS — I10 Essential (primary) hypertension: Secondary | ICD-10-CM

## 2019-04-15 NOTE — Telephone Encounter (Signed)
-----   Message from Cloyd Stagers, RT sent at 04/06/2019  2:21 PM EDT ----- Regarding: Lab Orders for Friday 9.18.2020 Please place lab orders for Friday 9.18.2020, office visit for physical on Monday 9.21.2020 Thank you, Dyke Maes RT(R)

## 2019-04-16 ENCOUNTER — Other Ambulatory Visit (INDEPENDENT_AMBULATORY_CARE_PROVIDER_SITE_OTHER): Payer: BC Managed Care – PPO

## 2019-04-16 ENCOUNTER — Other Ambulatory Visit: Payer: Self-pay

## 2019-04-16 DIAGNOSIS — I10 Essential (primary) hypertension: Secondary | ICD-10-CM | POA: Diagnosis not present

## 2019-04-16 DIAGNOSIS — E559 Vitamin D deficiency, unspecified: Secondary | ICD-10-CM

## 2019-04-16 DIAGNOSIS — E7800 Pure hypercholesterolemia, unspecified: Secondary | ICD-10-CM

## 2019-04-16 LAB — TSH: TSH: 2.79 u[IU]/mL (ref 0.35–4.50)

## 2019-04-16 LAB — CBC WITH DIFFERENTIAL/PLATELET
Basophils Absolute: 0 10*3/uL (ref 0.0–0.1)
Basophils Relative: 1 % (ref 0.0–3.0)
Eosinophils Absolute: 0.1 10*3/uL (ref 0.0–0.7)
Eosinophils Relative: 1.5 % (ref 0.0–5.0)
HCT: 37.6 % (ref 36.0–46.0)
Hemoglobin: 12.6 g/dL (ref 12.0–15.0)
Lymphocytes Relative: 42.4 % (ref 12.0–46.0)
Lymphs Abs: 1.9 10*3/uL (ref 0.7–4.0)
MCHC: 33.4 g/dL (ref 30.0–36.0)
MCV: 88.4 fl (ref 78.0–100.0)
Monocytes Absolute: 0.5 10*3/uL (ref 0.1–1.0)
Monocytes Relative: 10.3 % (ref 3.0–12.0)
Neutro Abs: 2 10*3/uL (ref 1.4–7.7)
Neutrophils Relative %: 44.8 % (ref 43.0–77.0)
Platelets: 224 10*3/uL (ref 150.0–400.0)
RBC: 4.26 Mil/uL (ref 3.87–5.11)
RDW: 13.2 % (ref 11.5–15.5)
WBC: 4.4 10*3/uL (ref 4.0–10.5)

## 2019-04-16 LAB — COMPREHENSIVE METABOLIC PANEL
ALT: 18 U/L (ref 0–35)
AST: 15 U/L (ref 0–37)
Albumin: 4.5 g/dL (ref 3.5–5.2)
Alkaline Phosphatase: 48 U/L (ref 39–117)
BUN: 14 mg/dL (ref 6–23)
CO2: 30 mEq/L (ref 19–32)
Calcium: 9.9 mg/dL (ref 8.4–10.5)
Chloride: 101 mEq/L (ref 96–112)
Creatinine, Ser: 0.71 mg/dL (ref 0.40–1.20)
GFR: 82.49 mL/min (ref >60.00)
Glucose, Bld: 88 mg/dL (ref 70–99)
Potassium: 4.4 mEq/L (ref 3.5–5.1)
Sodium: 138 mEq/L (ref 135–145)
Total Bilirubin: 0.4 mg/dL (ref 0.2–1.2)
Total Protein: 7.1 g/dL (ref 6.0–8.3)

## 2019-04-16 LAB — LIPID PANEL
Cholesterol: 191 mg/dL (ref 0–200)
HDL: 60.5 mg/dL (ref >39.00)
LDL Cholesterol: 108 mg/dL — ABNORMAL HIGH (ref 0–99)
NonHDL: 130.28
Total CHOL/HDL Ratio: 3
Triglycerides: 109 mg/dL (ref 0.0–149.0)
VLDL: 21.8 mg/dL (ref 0.0–40.0)

## 2019-04-16 LAB — VITAMIN D 25 HYDROXY (VIT D DEFICIENCY, FRACTURES): VITD: 44.07 ng/mL (ref 30.00–100.00)

## 2019-04-19 ENCOUNTER — Encounter: Payer: Self-pay | Admitting: Family Medicine

## 2019-04-19 ENCOUNTER — Other Ambulatory Visit: Payer: Self-pay

## 2019-04-19 ENCOUNTER — Ambulatory Visit (INDEPENDENT_AMBULATORY_CARE_PROVIDER_SITE_OTHER): Payer: BC Managed Care – PPO | Admitting: Family Medicine

## 2019-04-19 VITALS — BP 112/60 | HR 67 | Temp 97.1°F | Ht 64.25 in | Wt 146.5 lb

## 2019-04-19 DIAGNOSIS — E7800 Pure hypercholesterolemia, unspecified: Secondary | ICD-10-CM | POA: Diagnosis not present

## 2019-04-19 DIAGNOSIS — Z0000 Encounter for general adult medical examination without abnormal findings: Secondary | ICD-10-CM | POA: Diagnosis not present

## 2019-04-19 DIAGNOSIS — E559 Vitamin D deficiency, unspecified: Secondary | ICD-10-CM

## 2019-04-19 DIAGNOSIS — E2839 Other primary ovarian failure: Secondary | ICD-10-CM | POA: Insufficient documentation

## 2019-04-19 DIAGNOSIS — Z23 Encounter for immunization: Secondary | ICD-10-CM

## 2019-04-19 DIAGNOSIS — M720 Palmar fascial fibromatosis [Dupuytren]: Secondary | ICD-10-CM

## 2019-04-19 DIAGNOSIS — I10 Essential (primary) hypertension: Secondary | ICD-10-CM | POA: Diagnosis not present

## 2019-04-19 MED ORDER — LISINOPRIL 10 MG PO TABS: 10.0000 mg | ORAL_TABLET | Freq: Every day | ORAL | 3 refills | Status: DC

## 2019-04-19 MED ORDER — AMLODIPINE BESYLATE 5 MG PO TABS: 5.0000 mg | ORAL_TABLET | Freq: Every day | ORAL | 3 refills | Status: DC

## 2019-04-19 MED ORDER — ATORVASTATIN CALCIUM 20 MG PO TABS: 20.0000 mg | ORAL_TABLET | Freq: Every day | ORAL | 3 refills | Status: DC

## 2019-04-19 NOTE — Assessment & Plan Note (Signed)
Vitamin D level is therapeutic with current supplementation Disc importance of this to bone and overall health Level of 44.07 dexa also ordered (may have broken 5th R toe this summer)

## 2019-04-19 NOTE — Assessment & Plan Note (Signed)
Reviewed health habits including diet and exercise and skin cancer prevention Reviewed appropriate screening tests for age  Also reviewed health mt list, fam hx and immunization status , as well as social and family history   See HPI Labs reviewed  Flu shot today prevnar vaccine today dexa ordered today Colonoscopy due in January-pt plans to schedule

## 2019-04-19 NOTE — Assessment & Plan Note (Signed)
More problems now  Had surgery in the past in L hand  Painful tendons in L palm Would like to see a hand specialist

## 2019-04-19 NOTE — Assessment & Plan Note (Signed)
dexa order done 

## 2019-04-19 NOTE — Assessment & Plan Note (Signed)
Disc goals for lipids and reasons to control them Rev last labs with pt Rev low sat fat diet in detail Fairly stable with LDL of 108 and good HDL

## 2019-04-19 NOTE — Patient Instructions (Addendum)
prevnar vaccine today  Flu vaccine today   Let us know in January if you need a referral for colonoscopy   For cholesterol Avoid red meat/ fried foods/ egg yolks/ fatty breakfast meats/ butter, cheese and high fat dairy/ and shellfish    Stop at check out to schedule your dexa   The office will call you regarding the hand doctor referral

## 2019-04-19 NOTE — Assessment & Plan Note (Signed)
bp in fair control at this time  BP Readings from Last 1 Encounters:  04/19/19 112/60   No changes needed Most recent labs reviewed  Disc lifstyle change with low sodium diet and exercise

## 2019-04-19 NOTE — Progress Notes (Signed)
Subjective:    Patient ID: Brittney Johnson, female    DOB: 27-Jun-1956, 63 y.o.   MRN: [id number]  HPI Here for health maintenance exam and to review chronic medical problems   Staying safe  Works at home    [employer] from Last 3 Encounters:  04/19/19 146 lb 8 oz (66.5 kg)  04/13/18 142 lb (64.4 kg)  01/13/18 143 lb 8 oz (65.1 kg)  taking care of herself -gained 4 lb since working from [employer] more  Exercise-walking every am at least 30 minutes  24.95 kg/m    Due for pna 13 Flu vaccine will do the high dose   Colonoscopy 1/18  With 3 y recall   Tdap 5/12 zostavax 10/16  Mammogram 6/20 -addn views negative  Self breast exam -no lumps   Pap 9/19 -negative  No gyn symptoms   dexa 5/10  Vit D level is tx today at 44.07  Wants to do a dexa One fall 2 wk ago with no injuries- (carrying vacuum and stepped on dog toy)  May have broken toe in July - still red and swollen mild   bp is stable today  No cp or palpitations or headaches or edema  No side effects to medicines  BP Readings from Last 3 Encounters:  04/19/19 112/60  04/13/18 106/64  01/13/18 122/72      Hyperlipidemia Lab Results  Component Value Date   CHOL 191 04/16/2019   CHOL 181 04/09/2018   CHOL 185 04/04/2017   Lab Results  Component Value Date   HDL 60.50 04/16/2019   HDL 61.00 04/09/2018   HDL 59.40 04/04/2017   Lab Results  Component Value Date   LDLCALC 108 (H) 04/16/2019   LDLCALC 100 (H) 04/09/2018   LDLCALC 110 (H) 04/04/2017   Lab Results  Component Value Date   TRIG 109.0 04/16/2019   TRIG 101.0 04/09/2018   TRIG 79.0 04/04/2017   Lab Results  Component Value Date   CHOLHDL 3 04/16/2019   CHOLHDL 3 04/09/2018   CHOLHDL 3 04/04/2017   Lab Results  Component Value Date   LDLDIRECT 160.9 03/02/2013   LDLDIRECT 152.1 02/03/2012   LDLDIRECT 128.7 05/31/2011   LDL went up a bit / HDL is excellent  No fried food except snack foods occ  occ red meat    Other  labs Results for orders placed or performed in visit on 04/16/19  VITAMIN D 25 Hydroxy (Vit-D Deficiency, Fractures)  Result Value Ref Range   VITD 44.07 30.00 - 100.00 ng/mL  TSH  Result Value Ref Range   TSH 2.79 0.35 - 4.50 uIU/mL  Lipid panel  Result Value Ref Range   Cholesterol 191 0 - 200 mg/dL   Triglycerides 109.0 0.0 - 149.0 mg/dL   HDL 60.50 >39.00 mg/dL   VLDL 21.8 0.0 - 40.0 mg/dL   LDL Cholesterol 108 (H) 0 - 99 mg/dL   Total CHOL/HDL Ratio 3    NonHDL 130.28   Comprehensive metabolic panel  Result Value Ref Range   Sodium 138 135 - 145 mEq/L   Potassium 4.4 3.5 - 5.1 mEq/L   Chloride 101 96 - 112 mEq/L   CO2 30 19 - 32 mEq/L   Glucose, Bld 88 70 - 99 mg/dL   BUN 14 6 - 23 mg/dL   Creatinine, Ser 0.71 0.40 - 1.20 mg/dL   Total Bilirubin 0.4 0.2 - 1.2 mg/dL   Alkaline Phosphatase 48 39 - 117 U/L   AST  15 0 - 37 U/L   ALT 18 0 - 35 U/L   Total Protein 7.1 6.0 - 8.3 g/dL   Albumin 4.5 3.5 - 5.2 g/dL   Calcium 9.9 8.4 - 10.5 mg/dL   GFR 82.49 >60.00 mL/min  CBC with Differential/Platelet  Result Value Ref Range   WBC 4.4 4.0 - 10.5 K/uL   RBC 4.26 3.87 - 5.11 Mil/uL   Hemoglobin 12.6 12.0 - 15.0 g/dL   HCT 37.6 36.0 - 46.0 %   MCV 88.4 78.0 - 100.0 fl   MCHC 33.4 30.0 - 36.0 g/dL   RDW 13.2 11.5 - 15.5 %   Platelets 224.0 150.0 - 400.0 K/uL   Neutrophils Relative % 44.8 43.0 - 77.0 %   Lymphocytes Relative 42.4 12.0 - 46.0 %   Monocytes Relative 10.3 3.0 - 12.0 %   Eosinophils Relative 1.5 0.0 - 5.0 %   Basophils Relative 1.0 0.0 - 3.0 %   Neutro Abs 2.0 1.4 - 7.7 K/uL   Lymphs Abs 1.9 0.7 - 4.0 K/uL   Monocytes Absolute 0.5 0.1 - 1.0 K/uL   Eosinophils Absolute 0.1 0.0 - 0.7 K/uL   Basophils Absolute 0.0 0.0 - 0.1 K/uL     Has dupuytrens=had hand surgery L hand in the past  Wants to see someone new     Review of Systems  Constitutional: Negative for activity change, appetite change, fatigue, fever and unexpected weight change.  HENT: Negative  for congestion, ear pain, rhinorrhea, sinus pressure and sore throat.   Eyes: Negative for pain, redness and visual disturbance.  Respiratory: Negative for cough, shortness of breath and wheezing.   Cardiovascular: Negative for chest pain and palpitations.  Gastrointestinal: Negative for abdominal pain, blood in stool, constipation and diarrhea.  Endocrine: Negative for polydipsia and polyuria.  Genitourinary: Negative for dysuria, frequency and urgency.  Musculoskeletal: Positive for arthralgias. Negative for back pain and myalgias.       Hand pain from tendon problem/Dupuytren's   Pain in R 5th toe-injured this summer  Skin: Negative for pallor and rash.  Allergic/Immunologic: Negative for environmental allergies.  Neurological: Negative for dizziness, syncope and headaches.  Hematological: Negative for adenopathy. Does not bruise/bleed easily.  Psychiatric/Behavioral: Negative for decreased concentration and dysphoric mood. The patient is not nervous/anxious.    Patient Active Problem List   Diagnosis Date Noted  . Estrogen deficiency 04/19/2019  . Nocturnal leg cramps 04/13/2018  . Colon cancer screening 04/09/2016  . Vitamin D deficiency 04/09/2016  . GERD (gastroesophageal reflux disease) 04/09/2016  . Enlarged thyroid 12/05/2015  . Encounter for routine gynecological examination 02/20/2015  . Near syncope 08/05/2014  . Abnormal EKG 08/05/2014  . Anxiety 08/05/2014  . H/O cold sores 06/06/2014  . Dupuytren's contracture of right hand 02/08/2014  . Stress reaction 03/09/2013  . Routine general medical examination at a health care facility 12/05/2010  . Other screening mammogram 12/05/2010  . Raynaud disease 12/05/2010  . Hyperlipidemia 06/29/2010  . HYPERTENSION, BENIGN ESSENTIAL 06/29/2010  . Lumbar back pain with radiculopathy affecting right lower extremity 06/29/2010   Past Medical History:  Diagnosis Date  . Asthma    on inhaler prn  . Atypical pneumonia 1998    not hosp  . DDD (degenerative disc disease), cervical   . Hyperlipidemia   . Hypertension   . Low back pain    on and off  . PONV (postoperative nausea and vomiting)    due to some sedations!  . Syncope and collapse 07/16/2007  Past Surgical History:  Procedure Laterality Date  . ABDOMINAL HYSTERECTOMY     supracervical- L ovary remains  . CARDIOVASCULAR STRESS TEST     08/28/06 normal   . left hand surgery Left 2016   Dupuytrens  . LUMBAR DISC SURGERY  08/2017   [facility] Halsted  . OOPHORECTOMY Right    Right ovary removed   . TUBAL LIGATION     ? 1979   Social History   Tobacco Use  . Smoking status: Former Smoker    Quit date: 07/29/1997    Years since quitting: 21.7  . Smokeless tobacco: Never Used  Substance Use Topics  . Alcohol use: Yes    Alcohol/week: 0.0 standard drinks    Comment: occ  . Drug use: No   Family History  Problem Relation Age of Onset  . Lung cancer Father   . Hyperlipidemia Mother   . Diabetes Mother   . Hypertension Mother   . Stroke Mother   . Heart disease Other   . Lung cancer Other        uncle  . Pancreatic cancer Other        unspecified family member  . Diabetes Other        aunt  . Coronary artery disease Maternal Grandfather   . Breast cancer Maternal Aunt   . Thyroid disease Neg Hx    Allergies  Allergen Reactions  . Codeine     REACTION: passed out  . Bee Venom     swelling   Current Outpatient Medications on File Prior to Visit  Medication Sig Dispense Refill  . Calcium Carbonate-Vit D-Min 600-400 MG-UNIT TABS Take 2 tablets by mouth.     . Multiple Vitamin (MULTIVITAMIN) tablet Take 1 tablet by mouth daily.    . Omega-3 Fatty Acids (FISH OIL) 1000 MG CAPS Take 1 capsule by mouth 2 (two) times daily.      Marland Kitchen albuterol (VENTOLIN HFA) 108 (90 BASE) MCG/ACT inhaler Inhale 2 puffs into the lungs every 4 (four) hours as needed for wheezing. 2 puffs prior to exercise as needed. (Patient not taking: Reported on 04/19/2019) 1 Inhaler 3    No current facility-administered medications on file prior to visit.        Objective:   Physical Exam Constitutional:      General: She is not in acute distress.    Appearance: Normal appearance. She is well-developed and normal weight. She is not ill-appearing or diaphoretic.  HENT:     Head: Normocephalic and atraumatic.     Right Ear: Tympanic membrane, ear canal and external ear normal.     Left Ear: Tympanic membrane, ear canal and external ear normal.     Nose: Nose normal. No congestion.     Mouth/Throat:     Mouth: Mucous membranes are moist.     Pharynx: Oropharynx is clear. No posterior oropharyngeal erythema.  Eyes:     General: No scleral icterus.    Extraocular Movements: Extraocular movements intact.     Conjunctiva/sclera: Conjunctivae normal.     Pupils: Pupils are equal, round, and reactive to light.  Neck:     Musculoskeletal: Normal range of motion and neck supple. No neck rigidity or muscular tenderness.     Thyroid: No thyromegaly.     Vascular: No carotid bruit or JVD.  Cardiovascular:     Rate and Rhythm: Normal rate and regular rhythm.     Pulses: Normal pulses.     Heart sounds: Normal heart  sounds. No gallop.   Pulmonary:     Effort: Pulmonary effort is normal. No respiratory distress.     Breath sounds: Normal breath sounds. No wheezing.     Comments: Good air exch Chest:     Chest wall: No tenderness.  Abdominal:     General: Bowel sounds are normal. There is no distension or abdominal bruit.     Palpations: Abdomen is soft. There is no mass.     Tenderness: There is no abdominal tenderness.     Hernia: No hernia is present.  Genitourinary:    Comments: Breast exam: No mass, nodules, thickening, tenderness, bulging, retraction, inflamation, nipple discharge or skin changes noted.  No axillary or clavicular LA.     Musculoskeletal: Normal range of motion.        General: No tenderness.     Right lower leg: No edema.     Left lower leg: No  edema.     Comments: Thickened tendons in L palm   R 5th toe is mildly swollen with old bruising   Lymphadenopathy:     Cervical: No cervical adenopathy.  Skin:    General: Skin is warm and dry.     Coloration: Skin is not pale.     Findings: No erythema or rash.  Neurological:     Mental Status: She is alert. Mental status is at baseline.     Cranial Nerves: No cranial nerve deficit.     Motor: No abnormal muscle tone.     Coordination: Coordination normal.     Gait: Gait normal.     Deep Tendon Reflexes: Reflexes are normal and symmetric. Reflexes normal.  Psychiatric:        Mood and Affect: Mood normal.        Cognition and Memory: Cognition and memory normal.           Assessment & Plan:   Problem List Items Addressed This Visit      Cardiovascular and Mediastinum   HYPERTENSION, BENIGN ESSENTIAL    bp in fair control at this time  BP Readings from Last 1 Encounters:  04/19/19 112/60   No changes needed Most recent labs reviewed  Disc lifstyle change with low sodium diet and exercise        Relevant Medications   lisinopril (ZESTRIL) 10 MG tablet   amLODipine (NORVASC) 5 MG tablet   atorvastatin (LIPITOR) 20 MG tablet     Musculoskeletal and Integument   Dupuytren's contracture of right hand    More problems now  Had surgery in the past in L hand  Painful tendons in L palm Would like to see a hand specialist       Relevant Orders   Ambulatory referral to Orthopedic Surgery     Other   Hyperlipidemia    Disc goals for lipids and reasons to control them Rev last labs with pt Rev low sat fat diet in detail Fairly stable with LDL of 108 and good HDL      Relevant Medications   lisinopril (ZESTRIL) 10 MG tablet   amLODipine (NORVASC) 5 MG tablet   atorvastatin (LIPITOR) 20 MG tablet   Routine general medical examination at a health care facility - Primary    Reviewed health habits including diet and exercise and skin cancer prevention Reviewed  appropriate screening tests for age  Also reviewed health mt list, fam hx and immunization status , as well as social and family history   See HPI Labs  reviewed  Flu shot today prevnar vaccine today dexa ordered today Colonoscopy due in January-pt plans to schedule       Vitamin D deficiency    Vitamin D level is therapeutic with current supplementation Disc importance of this to bone and overall health Level of 44.07 dexa also ordered (may have broken 5th R toe this summer)        Estrogen deficiency    dexa order done        Relevant Orders   DG Bone Density    Other Visit Diagnoses    Need for influenza vaccination       Relevant Orders   Flu Vaccine QUAD High Dose(Fluad) (Completed)   Need for vaccination with 13-polyvalent pneumococcal conjugate vaccine       Relevant Orders   Pneumococcal conjugate vaccine 13-valent (Completed)

## 2019-04-27 DIAGNOSIS — M720 Palmar fascial fibromatosis [Dupuytren]: Secondary | ICD-10-CM | POA: Diagnosis not present

## 2019-05-04 ENCOUNTER — Other Ambulatory Visit: Payer: Self-pay | Admitting: Orthopaedic Surgery

## 2019-05-04 DIAGNOSIS — M720 Palmar fascial fibromatosis [Dupuytren]: Secondary | ICD-10-CM

## 2019-05-05 ENCOUNTER — Other Ambulatory Visit: Payer: Self-pay | Admitting: Orthopaedic Surgery

## 2019-05-05 DIAGNOSIS — M720 Palmar fascial fibromatosis [Dupuytren]: Secondary | ICD-10-CM

## 2019-05-05 DIAGNOSIS — S64495A Injury of digital nerve of left ring finger, initial encounter: Secondary | ICD-10-CM

## 2019-05-06 ENCOUNTER — Other Ambulatory Visit: Payer: Self-pay

## 2019-05-06 ENCOUNTER — Ambulatory Visit
Admission: RE | Admit: 2019-05-06 | Discharge: 2019-05-06 | Disposition: A | Discharge status: Home / Self Care | Payer: BC Managed Care – PPO | Source: Ambulatory Visit | Attending: Orthopaedic Surgery | Admitting: Orthopaedic Surgery

## 2019-05-06 DIAGNOSIS — M720 Palmar fascial fibromatosis [Dupuytren]: Secondary | ICD-10-CM | POA: Diagnosis not present

## 2019-05-06 DIAGNOSIS — S64495A Injury of digital nerve of left ring finger, initial encounter: Secondary | ICD-10-CM

## 2019-05-06 DIAGNOSIS — R2232 Localized swelling, mass and lump, left upper limb: Secondary | ICD-10-CM | POA: Diagnosis not present

## 2019-05-10 ENCOUNTER — Ambulatory Visit
Admission: RE | Admit: 2019-05-10 | Discharge: 2019-05-10 | Disposition: A | Discharge status: Home / Self Care | Payer: BC Managed Care – PPO | Source: Ambulatory Visit | Attending: Family Medicine | Admitting: Family Medicine

## 2019-05-10 DIAGNOSIS — E2839 Other primary ovarian failure: Secondary | ICD-10-CM

## 2019-05-10 DIAGNOSIS — M85852 Other specified disorders of bone density and structure, left thigh: Secondary | ICD-10-CM | POA: Diagnosis not present

## 2019-05-15 ENCOUNTER — Encounter: Payer: Self-pay | Admitting: Family Medicine

## 2019-05-15 DIAGNOSIS — M8580 Other specified disorders of bone density and structure, unspecified site: Secondary | ICD-10-CM | POA: Insufficient documentation

## 2019-05-17 ENCOUNTER — Encounter: Payer: Self-pay | Admitting: *Deleted

## 2019-07-30 DIAGNOSIS — C50919 Malignant neoplasm of unspecified site of unspecified female breast: Secondary | ICD-10-CM

## 2019-07-30 HISTORY — DX: Malignant neoplasm of unspecified site of unspecified female breast: C50.919

## 2019-08-17 ENCOUNTER — Encounter: Payer: Self-pay | Admitting: Family Medicine

## 2019-08-19 ENCOUNTER — Ambulatory Visit: Payer: BC Managed Care – PPO | Attending: Internal Medicine

## 2019-08-19 DIAGNOSIS — Z20822 Contact with and (suspected) exposure to covid-19: Secondary | ICD-10-CM

## 2019-08-20 LAB — NOVEL CORONAVIRUS, NAA: SARS-CoV-2, NAA: NOT DETECTED

## 2019-09-23 ENCOUNTER — Encounter: Payer: Self-pay | Admitting: Family Medicine

## 2019-09-24 ENCOUNTER — Ambulatory Visit: Payer: BC Managed Care – PPO | Attending: Internal Medicine

## 2019-09-24 ENCOUNTER — Other Ambulatory Visit: Payer: Self-pay

## 2019-09-30 ENCOUNTER — Ambulatory Visit: Payer: BC Managed Care – PPO

## 2019-10-04 ENCOUNTER — Ambulatory Visit: Payer: BC Managed Care – PPO | Attending: Internal Medicine

## 2019-10-04 DIAGNOSIS — Z23 Encounter for immunization: Secondary | ICD-10-CM

## 2019-10-04 NOTE — Progress Notes (Signed)
   Covid-19 Vaccination Clinic  Name:  Brittney Johnson    MRN: [id number] DOB: 1955/11/01  10/04/2019  [name] was observed post Covid-19 immunization for 15 minutes without incident. She was provided with Vaccine Information Sheet and instruction to access the V-Safe system.   [name] was instructed to call 911 with any severe reactions post vaccine: Marland Kitchen Difficulty breathing  . Swelling of face and throat  . A fast heartbeat  . A bad rash all over body  . Dizziness and weakness   Immunizations Administered    Name Date Dose VIS Date Route   Pfizer COVID-19 Vaccine 10/04/2019 10:01 AM 0.3 mL 07/09/2019 Intramuscular   Manufacturer: [place]   Lot: [id number]   [place]: [id number]

## 2019-10-25 ENCOUNTER — Encounter: Payer: Self-pay | Admitting: Family Medicine

## 2019-10-26 ENCOUNTER — Ambulatory Visit: Payer: BC Managed Care – PPO

## 2019-10-27 ENCOUNTER — Ambulatory Visit: Payer: BC Managed Care – PPO | Attending: Internal Medicine

## 2019-10-27 DIAGNOSIS — Z23 Encounter for immunization: Secondary | ICD-10-CM

## 2019-10-27 NOTE — Progress Notes (Signed)
   Covid-19 Vaccination Clinic  Name:  Brittney Johnson    MRN: [id number] DOB: 1956/05/06  10/27/2019  [name] was observed post Covid-19 immunization for 15 minutes without incident. She was provided with Vaccine Information Sheet and instruction to access the V-Safe system.   [name] was instructed to call 911 with any severe reactions post vaccine: Marland Kitchen Difficulty breathing  . Swelling of face and throat  . A fast heartbeat  . A bad rash all over body  . Dizziness and weakness   Immunizations Administered    Name Date Dose VIS Date Route   Pfizer COVID-19 Vaccine 10/27/2019 10:10 AM 0.3 mL 07/09/2019 Intramuscular   Manufacturer: [place]   Lot: [id number]   [place]: [id number]

## 2019-12-07 ENCOUNTER — Encounter: Payer: Self-pay | Admitting: Internal Medicine

## 2019-12-13 DIAGNOSIS — H00014 Hordeolum externum left upper eyelid: Secondary | ICD-10-CM | POA: Diagnosis not present

## 2019-12-21 ENCOUNTER — Telehealth: Payer: Self-pay

## 2019-12-21 ENCOUNTER — Other Ambulatory Visit: Payer: Self-pay | Admitting: Family Medicine

## 2019-12-21 DIAGNOSIS — Z1231 Encounter for screening mammogram for malignant neoplasm of breast: Secondary | ICD-10-CM

## 2019-12-21 NOTE — Telephone Encounter (Signed)
Patient contacted the office and states that she has a grandchild that is going to be born in September. She was advised to get her Tdap shot, and her last one was 12/15/10 - so she is techincally not due until 2022. Dr. Glori Bickers, is it ok for patient to come in sooner than the 1 year mark for her Tetanus, since she will be around new grandchild? Please advise,.

## 2019-12-21 NOTE — Telephone Encounter (Signed)
That is fine -please set up for a nurse visit

## 2019-12-22 NOTE — Telephone Encounter (Signed)
Original rule was it has be be over 30 days before another vaccine so it is okay I also double checked with Dr. Glori Bickers she said it's fine

## 2019-12-22 NOTE — Telephone Encounter (Signed)
Patient scheduled nurse visit on 12/29/19. Patient's 2nd Covid vaccine was on 10/27/19 and she wanted to make sure it wouldn't be too close to her Tdap.  I let her know if it's too close we would give her a call back to reschedule, otherwise she can come for her shot.

## 2019-12-29 ENCOUNTER — Ambulatory Visit: Payer: BC Managed Care – PPO

## 2020-01-06 ENCOUNTER — Ambulatory Visit: Payer: BC Managed Care – PPO | Admitting: *Deleted

## 2020-01-06 ENCOUNTER — Ambulatory Visit: Payer: BC Managed Care – PPO

## 2020-01-06 DIAGNOSIS — Z23 Encounter for immunization: Secondary | ICD-10-CM

## 2020-01-09 ENCOUNTER — Encounter: Payer: Self-pay | Admitting: Family Medicine

## 2020-01-09 DIAGNOSIS — K115 Sialolithiasis: Secondary | ICD-10-CM

## 2020-02-07 ENCOUNTER — Ambulatory Visit
Admission: RE | Admit: 2020-02-07 | Discharge: 2020-02-07 | Disposition: A | Discharge status: Home / Self Care | Payer: BC Managed Care – PPO | Source: Ambulatory Visit | Attending: Family Medicine | Admitting: Family Medicine

## 2020-02-07 DIAGNOSIS — Z1231 Encounter for screening mammogram for malignant neoplasm of breast: Secondary | ICD-10-CM | POA: Diagnosis not present

## 2020-02-08 DIAGNOSIS — K115 Sialolithiasis: Secondary | ICD-10-CM | POA: Insufficient documentation

## 2020-02-08 NOTE — Telephone Encounter (Signed)
Referral to ENT for salivary stone Sending to [facility]

## 2020-02-08 NOTE — Addendum Note (Signed)
Addended by: Loura Pardon A on: 02/08/2020 04:43 PM   Modules accepted: Orders

## 2020-02-09 ENCOUNTER — Other Ambulatory Visit: Payer: Self-pay | Admitting: Family Medicine

## 2020-02-09 DIAGNOSIS — N6489 Other specified disorders of breast: Secondary | ICD-10-CM

## 2020-02-09 DIAGNOSIS — R928 Other abnormal and inconclusive findings on diagnostic imaging of breast: Secondary | ICD-10-CM

## 2020-02-16 ENCOUNTER — Ambulatory Visit
Admission: RE | Admit: 2020-02-16 | Discharge: 2020-02-16 | Disposition: A | Discharge status: Home / Self Care | Payer: BC Managed Care – PPO | Source: Ambulatory Visit | Attending: Family Medicine | Admitting: Family Medicine

## 2020-02-16 DIAGNOSIS — N6489 Other specified disorders of breast: Secondary | ICD-10-CM | POA: Insufficient documentation

## 2020-02-16 DIAGNOSIS — R928 Other abnormal and inconclusive findings on diagnostic imaging of breast: Secondary | ICD-10-CM

## 2020-02-16 DIAGNOSIS — N6322 Unspecified lump in the left breast, upper inner quadrant: Secondary | ICD-10-CM | POA: Diagnosis not present

## 2020-02-16 DIAGNOSIS — R922 Inconclusive mammogram: Secondary | ICD-10-CM | POA: Diagnosis not present

## 2020-02-17 ENCOUNTER — Other Ambulatory Visit: Payer: Self-pay | Admitting: Family Medicine

## 2020-02-17 DIAGNOSIS — R928 Other abnormal and inconclusive findings on diagnostic imaging of breast: Secondary | ICD-10-CM

## 2020-02-17 DIAGNOSIS — N6320 Unspecified lump in the left breast, unspecified quadrant: Secondary | ICD-10-CM

## 2020-02-21 ENCOUNTER — Encounter: Payer: BC Managed Care – PPO | Admitting: Internal Medicine

## 2020-02-22 ENCOUNTER — Ambulatory Visit
Admission: RE | Admit: 2020-02-22 | Discharge: 2020-02-22 | Disposition: A | Discharge status: Home / Self Care | Payer: BC Managed Care – PPO | Source: Ambulatory Visit | Attending: Family Medicine | Admitting: Family Medicine

## 2020-02-22 DIAGNOSIS — R928 Other abnormal and inconclusive findings on diagnostic imaging of breast: Secondary | ICD-10-CM

## 2020-02-22 DIAGNOSIS — N6322 Unspecified lump in the left breast, upper inner quadrant: Secondary | ICD-10-CM | POA: Diagnosis not present

## 2020-02-22 DIAGNOSIS — N6320 Unspecified lump in the left breast, unspecified quadrant: Secondary | ICD-10-CM

## 2020-02-22 DIAGNOSIS — C50212 Malignant neoplasm of upper-inner quadrant of left female breast: Secondary | ICD-10-CM | POA: Diagnosis not present

## 2020-02-22 HISTORY — PX: BREAST BIOPSY: SHX20

## 2020-02-28 ENCOUNTER — Encounter: Payer: Self-pay | Admitting: *Deleted

## 2020-02-28 DIAGNOSIS — C50912 Malignant neoplasm of unspecified site of left female breast: Secondary | ICD-10-CM

## 2020-02-29 ENCOUNTER — Ambulatory Visit (INDEPENDENT_AMBULATORY_CARE_PROVIDER_SITE_OTHER): Payer: BC Managed Care – PPO | Admitting: Otolaryngology

## 2020-02-29 DIAGNOSIS — C50212 Malignant neoplasm of upper-inner quadrant of left female breast: Secondary | ICD-10-CM | POA: Diagnosis not present

## 2020-02-29 LAB — SURGICAL PATHOLOGY

## 2020-02-29 NOTE — Progress Notes (Signed)
Called patient to initiate navigation services.  Patient is newly diagnosed with invasive mammary carcinoma. Reviewed path results again.  Patient has concerns if this is triple negative breast cancer.  Informed patient that those molecular studies are not back.   I have scheduled her to see Dr. Peyton Najjar and Dr. Rogue Bussing for consultation.  Will give educational material at her surgical consult.  She is to call with any questions or needs.

## 2020-03-01 ENCOUNTER — Inpatient Hospital Stay: Payer: BC Managed Care – PPO | Attending: Internal Medicine | Admitting: Internal Medicine

## 2020-03-01 ENCOUNTER — Other Ambulatory Visit: Payer: Self-pay | Admitting: *Deleted

## 2020-03-01 ENCOUNTER — Inpatient Hospital Stay: Payer: BC Managed Care – PPO

## 2020-03-01 ENCOUNTER — Encounter: Payer: Self-pay | Admitting: *Deleted

## 2020-03-01 ENCOUNTER — Other Ambulatory Visit: Payer: Self-pay

## 2020-03-01 ENCOUNTER — Encounter: Payer: Self-pay | Admitting: Internal Medicine

## 2020-03-01 DIAGNOSIS — Z7982 Long term (current) use of aspirin: Secondary | ICD-10-CM | POA: Diagnosis not present

## 2020-03-01 DIAGNOSIS — C50212 Malignant neoplasm of upper-inner quadrant of left female breast: Secondary | ICD-10-CM | POA: Insufficient documentation

## 2020-03-01 DIAGNOSIS — E785 Hyperlipidemia, unspecified: Secondary | ICD-10-CM | POA: Insufficient documentation

## 2020-03-01 DIAGNOSIS — M8580 Other specified disorders of bone density and structure, unspecified site: Secondary | ICD-10-CM | POA: Diagnosis not present

## 2020-03-01 DIAGNOSIS — Z79899 Other long term (current) drug therapy: Secondary | ICD-10-CM | POA: Insufficient documentation

## 2020-03-01 DIAGNOSIS — J45909 Unspecified asthma, uncomplicated: Secondary | ICD-10-CM | POA: Diagnosis not present

## 2020-03-01 DIAGNOSIS — Z803 Family history of malignant neoplasm of breast: Secondary | ICD-10-CM | POA: Insufficient documentation

## 2020-03-01 DIAGNOSIS — Z87891 Personal history of nicotine dependence: Secondary | ICD-10-CM | POA: Insufficient documentation

## 2020-03-01 DIAGNOSIS — I10 Essential (primary) hypertension: Secondary | ICD-10-CM | POA: Insufficient documentation

## 2020-03-01 DIAGNOSIS — Z170 Estrogen receptor positive status [ER+]: Secondary | ICD-10-CM | POA: Diagnosis not present

## 2020-03-01 NOTE — Progress Notes (Signed)
[facility] [place] NOTE  Patient Care Team: Tower, Wynelle Fanny, MD as PCP - General  CHIEF COMPLAINTS/PURPOSE OF CONSULTATION: Breast cancer  #  Oncology History Overview Note  # LEFT BREAST CA- INVASIVE MAMMARY CARCINOMA, NO SPECIAL TYPE, WITH MICROPAPILLARY  FEATURES; ER/PR Positive [90%]; Her 2 NEG; G-1; 0.8 x 0.7 x 1 cm.   # # SURVIVORSHIP:   # GENETICS:   DIAGNOSIS:   STAGE:         ;  GOALS:  CURRENT/MOST RECENT THERAPY :     Carcinoma of upper-inner quadrant of left breast in female, estrogen receptor positive ([place])  03/01/2020 Initial Diagnosis   Carcinoma of upper-inner quadrant of left breast in female, estrogen receptor positive ([place])      HISTORY OF PRESENTING ILLNESS:  Brittney Johnson 64 y.o.  female female with no prior history of breast cancer/or malignancies has been referred to [place] for further evaluation recommendations for new diagnosis of breast cancer.   Patient states she was found to have an abnormal screening mammogram which led to diagnostic mammogram/ultrasound/followed by biopsy-as summarized above.  Family history of breast cancer: dad- unknown history Menopause: years old Used OCP:  Used estrogen and progesterone therapy:in 2005; s/p TAH & Right oopherectomy x 5 years.   Previous biopsy: none  Review of Systems  Constitutional: Negative for chills, diaphoresis, fever, malaise/fatigue and weight loss.  HENT: Negative for nosebleeds and sore throat.   Eyes: Negative for double vision.  Respiratory: Negative for cough, hemoptysis, sputum production, shortness of breath and wheezing.   Cardiovascular: Negative for chest pain, palpitations, orthopnea and leg swelling.  Gastrointestinal: Negative for abdominal pain, blood in stool, constipation, diarrhea, heartburn, melena, nausea and vomiting.  Genitourinary: Negative for dysuria, frequency and urgency.  Musculoskeletal: Negative for back pain and joint pain.  Skin: Negative.   Negative for itching and rash.  Neurological: Negative for dizziness, tingling, focal weakness, weakness and headaches.  Endo/Heme/Allergies: Does not bruise/bleed easily.  Psychiatric/Behavioral: Negative for depression. The patient is not nervous/anxious and does not have insomnia.      MEDICAL HISTORY:  Past Medical History:  Diagnosis Date  . Asthma    on inhaler prn  . Atypical pneumonia 1998   not hosp  . DDD (degenerative disc disease), cervical   . Hyperlipidemia   . Hypertension   . Low back pain    on and off  . PONV (postoperative nausea and vomiting)    due to some sedations!  . Syncope and collapse 07/16/2007    SURGICAL HISTORY: Past Surgical History:  Procedure Laterality Date  . ABDOMINAL HYSTERECTOMY     supracervical- L ovary remains  . BREAST BIOPSY Left 02/22/2020   Q clip [place] bx path pend  . CARDIOVASCULAR STRESS TEST     08/28/06 normal   . left hand surgery Left 2016   Dupuytrens  . LUMBAR DISC SURGERY  08/2017   [facility]  . OOPHORECTOMY Right    Right ovary removed   . TUBAL LIGATION     ? 1979    SOCIAL HISTORY: Social History   Socioeconomic History  . Marital status: Married    Spouse name: Not on file  . Number of children: Not on file  . Years of education: Not on file  . Highest education level: Not on file  Occupational History  . Occupation: Adult nurse: [employer]  Tobacco Use  . Smoking status: Former Smoker    Quit date:  07/29/1997    Years since quitting: 22.6  . Smokeless tobacco: Never Used  Substance and Sexual Activity  . Alcohol use: Yes    Alcohol/week: 0.0 standard drinks    Comment: occ  . Drug use: No  . Sexual activity: Not on file  Other Topics Concern  . Not on file  Social History Narrative   Kids live close by, 3 grandkids- really enjoys, runs for exercise, ran a 5k March 2011; walks; quit 20 years; rare alcohol. Work for [employer]; lives in [place]. 2 boys- 30s.     Social Determinants of Health   Financial Resource Strain:   . Difficulty of Paying Living Expenses:   Food Insecurity:   . Worried About Charity fundraiser in the Last Year:   . Arboriculturist in the Last Year:   Transportation Needs:   . Film/video editor (Medical):   Marland Kitchen Lack of Transportation (Non-Medical):   Physical Activity:   . Days of Exercise per Week:   . Minutes of Exercise per Session:   Stress:   . Feeling of Stress :   Social Connections:   . Frequency of Communication with Friends and Family:   . Frequency of Social Gatherings with Friends and Family:   . Attends Religious Services:   . Active Member of Clubs or Organizations:   . Attends Archivist Meetings:   Marland Kitchen Marital Status:   Intimate Partner Violence:   . Fear of Current or Ex-Partner:   . Emotionally Abused:   Marland Kitchen Physically Abused:   . Sexually Abused:     FAMILY HISTORY: Family History  Problem Relation Age of Onset  . Lung cancer Father   . Hyperlipidemia Mother   . Diabetes Mother   . Hypertension Mother   . Stroke Mother   . Heart disease Other   . Lung cancer Other        uncle  . Pancreatic cancer Other        unspecified family member  . Diabetes Other        aunt  . Coronary artery disease Maternal Grandfather   . Breast cancer Maternal Aunt   . Thyroid disease Neg Hx     ALLERGIES:  is allergic to codeine and bee venom.  MEDICATIONS:  Current Outpatient Medications  Medication Sig Dispense Refill  . amLODipine (NORVASC) 5 MG tablet Take 1 tablet (5 mg total) by mouth daily. 90 tablet 3  . aspirin 81 MG chewable tablet Chew by mouth.    Marland Kitchen atorvastatin (LIPITOR) 20 MG tablet Take 1 tablet (20 mg total) by mouth daily. 90 tablet 3  . Calcium Carbonate-Vit D-Min 600-400 MG-UNIT TABS Take 2 tablets by mouth.     Marland Kitchen Lifitegrast (XIIDRA) 5 % SOLN Apply to eye.    Marland Kitchen lisinopril (ZESTRIL) 10 MG tablet Take 1 tablet (10 mg total) by mouth daily. 90 tablet 3  . Multiple  Vitamin (MULTIVITAMIN) tablet Take 1 tablet by mouth daily.    . Omega-3 Fatty Acids (FISH OIL) 1000 MG CAPS Take 1 capsule by mouth 2 (two) times daily.      Marland Kitchen albuterol (VENTOLIN HFA) 108 (90 BASE) MCG/ACT inhaler Inhale 2 puffs into the lungs every 4 (four) hours as needed for wheezing. 2 puffs prior to exercise as needed. (Patient not taking: Reported on 03/01/2020) 1 Inhaler 3  . MAXITROL 3.5-10000-0.1 ophthalmic suspension Place 1 drop into the left eye 4 (four) times daily. (Patient not taking: Reported on  03/01/2020)     No current facility-administered medications for this visit.      Marland Kitchen  PHYSICAL EXAMINATION: ECOG PERFORMANCE STATUS: 0 - Asymptomatic  Vitals:   03/01/20 1406  BP: (!) 144/77  Pulse: 79  Resp: 16  Temp: 98.3 F (36.8 C)  SpO2: 100%   Filed Weights   03/01/20 1406  Weight: 152 lb 6.4 oz (69.1 kg)    Physical Exam HENT:     Head: Normocephalic and atraumatic.     Mouth/Throat:     Pharynx: No oropharyngeal exudate.  Eyes:     Pupils: Pupils are equal, round, and reactive to light.  Cardiovascular:     Rate and Rhythm: Normal rate and regular rhythm.  Pulmonary:     Effort: No respiratory distress.     Breath sounds: No wheezing.  Abdominal:     General: Bowel sounds are normal. There is no distension.     Palpations: Abdomen is soft. There is no mass.     Tenderness: There is no abdominal tenderness. There is no guarding or rebound.  Musculoskeletal:        General: No tenderness. Normal range of motion.     Cervical back: Normal range of motion and neck supple.  Skin:    General: Skin is warm.  Neurological:     Mental Status: She is alert and oriented to person, place, and time.  Psychiatric:        Mood and Affect: Affect normal.      LABORATORY DATA:  I have reviewed the data as listed Lab Results  Component Value Date   WBC 4.4 04/16/2019   HGB 12.6 04/16/2019   HCT 37.6 04/16/2019   MCV 88.4 04/16/2019   PLT 224.0 04/16/2019    Recent Labs    04/16/19 0930  NA 138  K 4.4  CL 101  CO2 30  GLUCOSE 88  BUN 14  CREATININE 0.71  CALCIUM 9.9  PROT 7.1  ALBUMIN 4.5  AST 15  ALT 18  ALKPHOS 48  BILITOT 0.4    RADIOGRAPHIC STUDIES: I have personally reviewed the radiological images as listed and agreed with the findings in the report. US BREAST LTD UNI LEFT INC AXILLA  Result Date: 02/16/2020 CLINICAL DATA:  Screening recall for possible left breast asymmetry. EXAM: DIGITAL DIAGNOSTIC UNILATERAL LEFT MAMMOGRAM WITH TOMO AND CAD; ULTRASOUND LEFT BREAST LIMITED COMPARISON:  Previous exams. ACR Breast Density Category c: The breast tissue is heterogeneously dense, which may obscure small masses. FINDINGS: Additional tomograms were performed of the left breast. There is a mass with obscured margins in the upper slightly inner left breast. Mammographic images were processed with CAD. Targeted ultrasound of the left breast was performed. There is a hypoechoic mass with margin irregularity in the left breast at 10 o'clock 6 cm from nipple measuring 0.8 x 0.7 x 1 cm. This is felt to correspond with the mass seen in the upper inner left breast at mammography. No lymphadenopathy seen in the left axilla. IMPRESSION: Suspicious 1 cm mass in the left breast at the 10 o'clock position. RECOMMENDATION: Recommend ultrasound-guided biopsy of the mass in the left breast at the 10 o'clock position. I have discussed the findings and recommendations with the patient. If applicable, a reminder letter will be sent to the patient regarding the next appointment. BI-RADS CATEGORY  4: Suspicious. Electronically Signed   By: Everlean Alstrom M.D.   On: 02/16/2020 16:03   MM DIAG BREAST TOMO UNI LEFT  Result Date: 02/16/2020 CLINICAL DATA:  Screening recall for possible left breast asymmetry. EXAM: DIGITAL DIAGNOSTIC UNILATERAL LEFT MAMMOGRAM WITH TOMO AND CAD; ULTRASOUND LEFT BREAST LIMITED COMPARISON:  Previous exams. ACR Breast Density  Category c: The breast tissue is heterogeneously dense, which may obscure small masses. FINDINGS: Additional tomograms were performed of the left breast. There is a mass with obscured margins in the upper slightly inner left breast. Mammographic images were processed with CAD. Targeted ultrasound of the left breast was performed. There is a hypoechoic mass with margin irregularity in the left breast at 10 o'clock 6 cm from nipple measuring 0.8 x 0.7 x 1 cm. This is felt to correspond with the mass seen in the upper inner left breast at mammography. No lymphadenopathy seen in the left axilla. IMPRESSION: Suspicious 1 cm mass in the left breast at the 10 o'clock position. RECOMMENDATION: Recommend ultrasound-guided biopsy of the mass in the left breast at the 10 o'clock position. I have discussed the findings and recommendations with the patient. If applicable, a reminder letter will be sent to the patient regarding the next appointment. BI-RADS CATEGORY  4: Suspicious. Electronically Signed   By: Everlean Alstrom M.D.   On: 02/16/2020 16:03   MM 3D SCREEN BREAST BILATERAL  Result Date: 02/08/2020 CLINICAL DATA:  Screening. EXAM: DIGITAL SCREENING BILATERAL MAMMOGRAM WITH TOMO AND CAD COMPARISON:  Previous exam(s). ACR Breast Density Category c: The breast tissue is heterogeneously dense, which may obscure small masses. FINDINGS: In the left breast, a possible asymmetry warrants further evaluation. In the right breast, no findings suspicious for malignancy. Images were processed with CAD. IMPRESSION: Further evaluation is suggested for possible asymmetry in the left breast. RECOMMENDATION: Diagnostic mammogram and possibly ultrasound of the left breast. (Code:[id number]-[id number]) The patient will be contacted regarding the findings, and additional imaging will be scheduled. BI-RADS CATEGORY  0: Incomplete. Need additional imaging evaluation and/or prior mammograms for comparison. Electronically Signed   By: Margarette Canada M.D.    On: 02/08/2020 15:46   MM CLIP PLACEMENT LEFT  Result Date: 02/22/2020 CLINICAL DATA:  64 year old female status post ultrasound-guided biopsy of the left breast. EXAM: DIAGNOSTIC LEFT MAMMOGRAM POST ULTRASOUND BIOPSY COMPARISON:  Previous exam(s). FINDINGS: Mammographic images were obtained following ultrasound guided biopsy of the left breast. The biopsy marking clip is in expected position at the site of biopsy. IMPRESSION: Appropriate positioning of the Q shaped shaped biopsy marking clip at the site of biopsy in the upper inner left breast. Final Assessment: Post Procedure Mammograms for Marker Placement Electronically Signed   By: Kristopher Oppenheim M.D.   On: 02/22/2020 09:21   [place] LT BREAST BX W LOC DEV 1ST LESION IMG BX SPEC US GUIDE  Addendum Date: 02/28/2020   ADDENDUM REPORT: 02/25/2020 12:50 ADDENDUM: PATHOLOGY revealed: A. BREAST, LEFT AT 10:00, 6 CM FROM THE NIPPLE; ULTRASOUND-GUIDED CORE NEEDLE BIOPSY: - INVASIVE MAMMARY CARCINOMA, NO SPECIAL TYPE, WITH MICROPAPILLARY FEATURES. At least 9 mm in this sample. Grade 1. Ductal carcinoma in situ: Not identified. Lymphovascular invasion: Not identified. Pathology results are CONCORDANT with imaging findings, per Dr. Kristopher Oppenheim. Pathology results and recommendations below were discussed with patient by telephone on 02/24/2020. Patient reported biopsy site within normal limits with slight tenderness at the site. Post biopsy care instructions were reviewed and questions were answered. Patient was instructed to call [facility] if any concerns or questions arise related to the biopsy. Recommendations: 1. Surgical referral. Request for surgical referral was relayed to [place] and  Tanya Nones RN at [facility] by Electa Sniff RN on 02/24/2020. 2.  Bilateral breast MRI given patient's breast density. Addendum by Electa Sniff RN on 02/25/2020. Electronically Signed   By: Kristopher Oppenheim M.D.   On: 02/25/2020 12:50   Result  Date: 02/28/2020 CLINICAL DATA:  64 year old female with a suspicious left breast mass. EXAM: ULTRASOUND GUIDED LEFT BREAST CORE NEEDLE BIOPSY COMPARISON:  Previous exam(s). PROCEDURE: I met with the patient and we discussed the procedure of ultrasound-guided biopsy, including benefits and alternatives. We discussed the high likelihood of a successful procedure. We discussed the risks of the procedure, including infection, bleeding, tissue injury, clip migration, and inadequate sampling. Informed written consent was given. The usual time-out protocol was performed immediately prior to the procedure. Lesion quadrant: Upper inner quadrant Using sterile technique and 1% Lidocaine as local anesthetic, under direct ultrasound visualization, a 12 gauge spring-loaded device was used to perform biopsy of a left breast mass at the 10 o'clock position using a medial approach. At the conclusion of the procedure Q shaped tissue marker clip was deployed into the biopsy cavity. Follow up 2 view mammogram was performed and dictated separately. IMPRESSION: Ultrasound guided biopsy of the left breast. No apparent complications. Electronically Signed: By: Kristopher Oppenheim M.D. On: 02/22/2020 09:20    ASSESSMENT & PLAN:   Carcinoma of upper-inner quadrant of left breast in female, estrogen receptor positive ([place]) #Left breast invasive mammary carcinoma clinical stage I ER/PR positive HER-2 negative.   #  I had a long discussion with the patient in general regarding the treatment options of breast cancer including-surgery; adjuvant radiation; role of adjuvant systemic therapy including-chemotherapy antihormone therapy.   # Patient will likely need lumpectomy with sentinel lymph node evaluation; followed by radiation.  Awaiting on breast MRI.   # Decision regarding chemotherapy based on final surgical pathology/gene assay. Patient will benefit from antihormone therapy.  I discussed the potential benefits of each option; and  also potential downsides in detail.  #BMD OCT 2020- T score+ -1.4-osteopenia  #Genetic counseling-patient's father side of family unknown; will refer to genetics.  Thank you for allowing me to participate in the care of your pleasant patient. Please do not hesitate to contact me with questions or concerns in the interim.  # DISPOSITION: # genetic counseling referral re: breast cancer # Follow up TBD- Dr.B  All questions were answered. The patient/family knows to call the clinic with any problems, questions or concerns.    Cammie Sickle, MD 03/02/2020 8:32 AM

## 2020-03-01 NOTE — Assessment & Plan Note (Addendum)
#  Left breast invasive mammary carcinoma clinical stage I ER/PR positive HER-2 negative.   #  I had a long discussion with the patient in general regarding the treatment options of breast cancer including-surgery; adjuvant radiation; role of adjuvant systemic therapy including-chemotherapy antihormone therapy.   # Patient will likely need lumpectomy with sentinel lymph node evaluation; followed by radiation.  Awaiting on breast MRI.   # Decision regarding chemotherapy based on final surgical pathology/gene assay. Patient will benefit from antihormone therapy.  I discussed the potential benefits of each option; and also potential downsides in detail.  #BMD OCT 2020- T score+ -1.4-osteopenia  #Genetic counseling-patient's father side of family unknown; will refer to genetics.  Thank you for allowing me to participate in the care of your pleasant patient. Please do not hesitate to contact me with questions or concerns in the interim.  # DISPOSITION: # genetic counseling referral re: breast cancer # Follow up TBD- Dr.B

## 2020-03-02 ENCOUNTER — Other Ambulatory Visit: Payer: Self-pay | Admitting: General Surgery

## 2020-03-02 DIAGNOSIS — C50212 Malignant neoplasm of upper-inner quadrant of left female breast: Secondary | ICD-10-CM

## 2020-03-02 NOTE — Progress Notes (Signed)
Met patient and her husband today during her initial medical oncology consult.  Patient relieved that she is ER/PR positive Her2 negative.  States she is waiting on an MRI prior to getting her surgery scheduled.  Patient breast cancer educational literature, "My Breast Cancer Treatment Handbook" by Josephine Igo, RN given at her surgical consult.  She is to call with any questions or needs.

## 2020-03-07 ENCOUNTER — Other Ambulatory Visit: Payer: Self-pay

## 2020-03-07 ENCOUNTER — Ambulatory Visit
Admission: RE | Admit: 2020-03-07 | Discharge: 2020-03-07 | Disposition: A | Discharge status: Home / Self Care | Payer: BC Managed Care – PPO | Source: Ambulatory Visit | Attending: General Surgery | Admitting: General Surgery

## 2020-03-07 DIAGNOSIS — C50212 Malignant neoplasm of upper-inner quadrant of left female breast: Secondary | ICD-10-CM | POA: Insufficient documentation

## 2020-03-07 DIAGNOSIS — N6322 Unspecified lump in the left breast, upper inner quadrant: Secondary | ICD-10-CM | POA: Diagnosis not present

## 2020-03-07 MED ORDER — GADOBUTROL 1 MMOL/ML IV SOLN
6.0000 mL | Freq: Once | INTRAVENOUS | Status: AC | PRN
  Administered 2020-03-07: 6 mL via INTRAVENOUS

## 2020-03-08 ENCOUNTER — Other Ambulatory Visit: Payer: Self-pay | Admitting: General Surgery

## 2020-03-08 DIAGNOSIS — C50312 Malignant neoplasm of lower-inner quadrant of left female breast: Secondary | ICD-10-CM

## 2020-03-09 ENCOUNTER — Ambulatory Visit: Payer: Self-pay | Admitting: General Surgery

## 2020-03-09 ENCOUNTER — Other Ambulatory Visit: Payer: Self-pay | Admitting: General Surgery

## 2020-03-09 DIAGNOSIS — C50212 Malignant neoplasm of upper-inner quadrant of left female breast: Secondary | ICD-10-CM

## 2020-03-09 NOTE — H&P (Signed)
PATIENT PROFILE: Brittney Johnson is a 64 y.o. female who presents to the Clinic for consultation at the request of Dr. Glori Johnson for evaluation of breast cancer.  PCP:  Tower, Brittney Austria, MD  HISTORY OF PRESENT ILLNESS: [name] reports she had her usual screening mammogram. She was called back for additional images. Diagnostic mammogram and ultrasound confirmed a 1 cm mass at the 10 o'clock position of the left breast. Ultrasound-guided core biopsy was done. This shows invasive mammary carcinoma. Due to the positive results in the density of her breast MRI of the breast was recommended.  Patient denies any breast pain, palpable mass, skin changes, nipple discharge, nipple retraction.  Family history of breast cancer: None Family history of other cancers: Lung cancer (father, brother and many other family members) Menarche: 5 Menopause: 1 Used OCP: Few months Number of pregnancies: 2 Age of first pregnancy: 54 Used estrogen and progesterone therapy: Few years after menopause History of Radiation to the chest: No Previous breast biopsy: None   PROBLEM LIST: Breast cancer Hypertension  GENERAL REVIEW OF SYSTEMS:   General ROS: negative for - chills, fatigue, fever, weight gain or weight loss Allergy and Immunology ROS: negative for - hives  Hematological and Lymphatic ROS: negative for - bleeding problems or bruising, negative for palpable nodes Endocrine ROS: negative for - heat or cold intolerance, hair changes Respiratory ROS: negative for - cough, shortness of breath or wheezing Cardiovascular ROS: no chest pain or palpitations GI ROS: negative for nausea, vomiting, abdominal pain, diarrhea, constipation Musculoskeletal ROS: negative for - joint swelling or muscle pain Neurological ROS: negative for - confusion, syncope Dermatological ROS: negative for pruritus and rash Psychiatric: negative for anxiety, depression, difficulty sleeping and memory  loss  MEDICATIONS: Current Medications        Current Outpatient Medications  Medication Sig Dispense Refill  . amLODIPine (NORVASC) 5 MG tablet Take 1 tablet by mouth once daily    . aspirin 81 MG chewable tablet Take by mouth 3 (three) times a week    . atorvastatin (LIPITOR) 20 MG tablet Take 1 tablet by mouth once daily    . calcium carbonate-vit D3-min 600 mg calcium- 400 unit Tab Take 2 tablets by mouth once daily    . docosahexaenoic acid-epa 120-180 mg Cap Take 1 capsule by mouth 2 (two) times daily    . lisinopriL (ZESTRIL) 10 MG tablet Take 1 tablet by mouth once daily    . multivitamin (MULTIVITAMIN) tablet Take 1 tablet by mouth once daily    . XIIDRA 5 % ophthalmic solution Place 1 drop into both eyes once daily     No current facility-administered medications for this visit.      ALLERGIES: Codeine  PAST MEDICAL HISTORY:     Past Medical History:  Diagnosis Date  . Hyperlipidemia   . Hypertension     PAST SURGICAL HISTORY:      Past Surgical History:  Procedure Laterality Date  . ABDOMINAL HYSTERECTOMY  2005   Laparoscopic  . back surgery  2019   Lumbar region -- take out cartiladge     FAMILY HISTORY:      Family History  Problem Relation Age of Onset  . Aneurysm Mother   . Stroke Mother   . Lung cancer Father   . Heart disease Maternal Grandmother   . Heart disease Maternal Grandfather      SOCIAL HISTORY: Social History          Socioeconomic History  . Marital  status: Married    Spouse name: Not on file  . Number of children: Not on file  . Years of education: Not on file  . Highest education level: Not on file  Occupational History  . Not on file  Tobacco Use  . Smoking status: Former Smoker    Packs/day: 0.50    Quit date: 02/29/2000    Years since quitting: 20.0  . Smokeless tobacco: Never Used  Vaping Use  . Vaping Use: Some days  Substance and Sexual Activity  . Alcohol use: Yes     Comment: socially -- weekends  . Drug use: Not on file  . Sexual activity: Not on file  Other Topics Concern  . Not on file  Social History Narrative  . Not on file   Social Determinants of Health      Financial Resource Strain:   . Difficulty of Paying Living Expenses:   Food Insecurity:   . Worried About Charity fundraiser in the Last Year:   . Arboriculturist in the Last Year:   Transportation Needs:   . Film/video editor (Medical):   Marland Kitchen Lack of Transportation (Non-Medical):       PHYSICAL EXAM:    Vitals:   02/29/20 1057  BP: 144/81  Pulse: 79   Body mass index is 26.09 kg/m. Weight: 68.9 kg (152 lb)   GENERAL: Alert, active, oriented x3  HEENT: Pupils equal reactive to light. Extraocular movements are intact. Sclera clear. Palpebral conjunctiva normal red color.Pharynx clear.  NECK: Supple with no palpable mass and no adenopathy.  LUNGS: Sound clear with no rales rhonchi or wheezes.  HEART: Regular rhythm S1 and S2 without murmur.  BREAST: breasts appear normal, no suspicious masses, no skin or nipple changes or axillary nodes.  ABDOMEN: Soft and depressible, nontender with no palpable mass, no hepatomegaly.  EXTREMITIES: Well-developed well-nourished symmetrical with no dependent edema.  NEUROLOGICAL: Awake alert oriented, facial expression symmetrical, moving all extremities.  REVIEW OF DATA: I have reviewed the following data today: No visits with results within 3 Month(s) from this visit.  Latest known visit with results is:  No results found for any previous visit.     ASSESSMENT: [name] is a 64 y.o. female presenting for consultation for left breast cancer.    Patient was oriented again about the pathology results. Surgical alternatives were discussed with patient including partial vs total mastectomy. Surgical technique and post operative care was discussed with patient. Risk of surgery was discussed with  patient including but not limited to: wound infection, seroma, hematoma, brachial plexopathy, mondor's disease (thrombosis of small veins of breast), chronic wound pain, breast lymphedema, altered sensation to the nipple and cosmesis among others.   ER/PR/HER-2 are currently pending. She has evaluation with medical oncologist tomorrow. I discussed with the patient the different surgical management such as partial mastectomy versus total mastectomy. I also discussed with the patient that the decision will depend on medical oncology evaluation.  I also discussed with the patient the recommendation of the radiologist of having MRI due to the extremely dense breast that is very difficult to completely evaluate with mammogram and breast ultrasound. We will coordinate breast MRI.  MRI done and there is no other area of concern.   Malignant neoplasm of upper-inner quadrant of left female breast, unspecified estrogen receptor status (CMS-HCC) [C50.212]  PLAN: 1.Left breast radiofrequency tag partial mastectomy and sentinel lymph node biopsy  Patient and her husband verbalized understanding, all questions  were answered, and were agreeable with the plan outlined above.     Herbert Pun, MD  Electronically signed by Herbert Pun, MD

## 2020-03-09 NOTE — H&P (View-Only) (Signed)
PATIENT PROFILE: Brittney Johnson is a 64 y.o. female who presents to the Clinic for consultation at the request of Brittney Johnson for evaluation of breast cancer.  PCP:  Tower, Brittney Austria, MD  HISTORY OF PRESENT ILLNESS: [name] reports she had her usual screening mammogram. She was called back for additional images. Diagnostic mammogram and ultrasound confirmed a 1 cm mass at the 10 o'clock position of the left breast. Ultrasound-guided core biopsy was done. This shows invasive mammary carcinoma. Due to the positive results in the density of her breast MRI of the breast was recommended.  Patient denies any breast pain, palpable mass, skin changes, nipple discharge, nipple retraction.  Family history of breast cancer: None Family history of other cancers: Lung cancer (father, brother and many other family members) Menarche: 67 Menopause: 44 Used OCP: Few months Number of pregnancies: 2 Age of first pregnancy: 62 Used estrogen and progesterone therapy: Few years after menopause History of Radiation to the chest: No Previous breast biopsy: None   PROBLEM LIST: Breast cancer Hypertension  GENERAL REVIEW OF SYSTEMS:   General ROS: negative for - chills, fatigue, fever, weight gain or weight loss Allergy and Immunology ROS: negative for - hives  Hematological and Lymphatic ROS: negative for - bleeding problems or bruising, negative for palpable nodes Endocrine ROS: negative for - heat or cold intolerance, hair changes Respiratory ROS: negative for - cough, shortness of breath or wheezing Cardiovascular ROS: no chest pain or palpitations GI ROS: negative for nausea, vomiting, abdominal pain, diarrhea, constipation Musculoskeletal ROS: negative for - joint swelling or muscle pain Neurological ROS: negative for - confusion, syncope Dermatological ROS: negative for pruritus and rash Psychiatric: negative for anxiety, depression, difficulty sleeping and memory  loss  MEDICATIONS: Current Medications        Current Outpatient Medications  Medication Sig Dispense Refill  . amLODIPine (NORVASC) 5 MG tablet Take 1 tablet by mouth once daily    . aspirin 81 MG chewable tablet Take by mouth 3 (three) times a week    . atorvastatin (LIPITOR) 20 MG tablet Take 1 tablet by mouth once daily    . calcium carbonate-vit D3-min 600 mg calcium- 400 unit Tab Take 2 tablets by mouth once daily    . docosahexaenoic acid-epa 120-180 mg Cap Take 1 capsule by mouth 2 (two) times daily    . lisinopriL (ZESTRIL) 10 MG tablet Take 1 tablet by mouth once daily    . multivitamin (MULTIVITAMIN) tablet Take 1 tablet by mouth once daily    . XIIDRA 5 % ophthalmic solution Place 1 drop into both eyes once daily     No current facility-administered medications for this visit.      ALLERGIES: Codeine  PAST MEDICAL HISTORY:     Past Medical History:  Diagnosis Date  . Hyperlipidemia   . Hypertension     PAST SURGICAL HISTORY:      Past Surgical History:  Procedure Laterality Date  . ABDOMINAL HYSTERECTOMY  2005   Laparoscopic  . back surgery  2019   Lumbar region -- take out cartiladge     FAMILY HISTORY:      Family History  Problem Relation Age of Onset  . Aneurysm Mother   . Stroke Mother   . Lung cancer Father   . Heart disease Maternal Grandmother   . Heart disease Maternal Grandfather      SOCIAL HISTORY: Social History          Socioeconomic History  . Marital  status: Married    Spouse name: Not on file  . Number of children: Not on file  . Years of education: Not on file  . Highest education level: Not on file  Occupational History  . Not on file  Tobacco Use  . Smoking status: Former Smoker    Packs/day: 0.50    Quit date: 02/29/2000    Years since quitting: 20.0  . Smokeless tobacco: Never Used  Vaping Use  . Vaping Use: Some days  Substance and Sexual Activity  . Alcohol use: Yes     Comment: socially -- weekends  . Drug use: Not on file  . Sexual activity: Not on file  Other Topics Concern  . Not on file  Social History Narrative  . Not on file   Social Determinants of Health      Financial Resource Strain:   . Difficulty of Paying Living Expenses:   Food Insecurity:   . Worried About Charity fundraiser in the Last Year:   . Arboriculturist in the Last Year:   Transportation Needs:   . Film/video editor (Medical):   Marland Kitchen Lack of Transportation (Non-Medical):       PHYSICAL EXAM:    Vitals:   02/29/20 1057  BP: 144/81  Pulse: 79   Body mass index is 26.09 kg/m. Weight: 68.9 kg (152 lb)   GENERAL: Alert, active, oriented x3  HEENT: Pupils equal reactive to light. Extraocular movements are intact. Sclera clear. Palpebral conjunctiva normal red color.Pharynx clear.  NECK: Supple with no palpable mass and no adenopathy.  LUNGS: Sound clear with no rales rhonchi or wheezes.  HEART: Regular rhythm S1 and S2 without murmur.  BREAST: breasts appear normal, no suspicious masses, no skin or nipple changes or axillary nodes.  ABDOMEN: Soft and depressible, nontender with no palpable mass, no hepatomegaly.  EXTREMITIES: Well-developed well-nourished symmetrical with no dependent edema.  NEUROLOGICAL: Awake alert oriented, facial expression symmetrical, moving all extremities.  REVIEW OF DATA: I have reviewed the following data today: No visits with results within 3 Month(s) from this visit.  Latest known visit with results is:  No results found for any previous visit.     ASSESSMENT: [name] is a 64 y.o. female presenting for consultation for left breast cancer.    Patient was oriented again about the pathology results. Surgical alternatives were discussed with patient including partial vs total mastectomy. Surgical technique and post operative care was discussed with patient. Risk of surgery was discussed with  patient including but not limited to: wound infection, seroma, hematoma, brachial plexopathy, mondor's disease (thrombosis of small veins of breast), chronic wound pain, breast lymphedema, altered sensation to the nipple and cosmesis among others.   ER/PR/HER-2 are currently pending. She has evaluation with medical oncologist tomorrow. I discussed with the patient the different surgical management such as partial mastectomy versus total mastectomy. I also discussed with the patient that the decision will depend on medical oncology evaluation.  I also discussed with the patient the recommendation of the radiologist of having MRI due to the extremely dense breast that is very difficult to completely evaluate with mammogram and breast ultrasound. We will coordinate breast MRI.  MRI done and there is no other area of concern.   Malignant neoplasm of upper-inner quadrant of left female breast, unspecified estrogen receptor status (CMS-HCC) [C50.212]  PLAN: 1.Left breast radiofrequency tag partial mastectomy and sentinel lymph node biopsy  Patient and her husband verbalized understanding, all questions  were answered, and were agreeable with the plan outlined above.     Herbert Pun, MD  Electronically signed by Herbert Pun, MD

## 2020-03-10 ENCOUNTER — Other Ambulatory Visit: Payer: Self-pay

## 2020-03-10 ENCOUNTER — Ambulatory Visit: Payer: Self-pay | Admitting: General Surgery

## 2020-03-10 ENCOUNTER — Encounter
Admission: RE | Admit: 2020-03-10 | Discharge: 2020-03-10 | Disposition: A | Discharge status: Home / Self Care | Payer: BC Managed Care – PPO | Source: Ambulatory Visit | Attending: General Surgery | Admitting: General Surgery

## 2020-03-10 DIAGNOSIS — Z01818 Encounter for other preprocedural examination: Secondary | ICD-10-CM | POA: Diagnosis not present

## 2020-03-10 DIAGNOSIS — I10 Essential (primary) hypertension: Secondary | ICD-10-CM | POA: Insufficient documentation

## 2020-03-10 NOTE — Patient Instructions (Signed)
Your procedure is scheduled on: Wednesday 03/15/20.  Report to the [facility] at 8:45 for your Sentinel Node Injection.    Remember: Instructions that are not followed completely may result in serious medical risk, up to and including death, or upon the discretion of your surgeon and anesthesiologist your surgery may need to be rescheduled.     __X__ 1. Do not eat food after midnight the night before your procedure.                 No gum chewing or hard candies. You may drink clear liquids up to 2 hours                 before you are scheduled to arrive for your surgery- DO NOT drink clear                 liquids within 2 hours of the start of your surgery.                 Clear Liquids include:  water, apple juice without pulp, clear carbohydrate                 drink such as Clearfast or Gatorade, Black Coffee or Tea (Do not add                 milk or creamer to coffee or tea).  __X__2.  On the morning of surgery brush your teeth with toothpaste and water, you may rinse your mouth with mouthwash if you wish.  Do not swallow any toothpaste or mouthwash.    __X__ 3.  No Alcohol for 24 hours before or after surgery.  __X__ 4.  Do Not Smoke or use e-cigarettes For 24 Hours Prior to Your Surgery.                 Do not use any chewable tobacco products for at least 6 hours prior to                 surgery.  __X__5.  Notify your doctor if there is any change in your medical condition      (cold, fever, infections).      Do NOT wear jewelry, make-up, hairpins, clips or nail polish. Do NOT wear lotions, powders, or perfumes.  Do NOT shave 48 hours prior to surgery. Men may shave face and neck. Do NOT bring valuables to the hospital.     [facility] is not responsible for any belongings or valuables.   Contacts, dentures/partials or body piercings may not be worn into surgery. Bring a case for your contacts, glasses or hearing aids, a denture cup will be supplied. Leave  your suitcase in the car. After surgery it may be brought to your room.   For patients admitted to the hospital, discharge time is determined by your treatment team.    Patients discharged the day of surgery will not be allowed to drive home.     __X__ Take these medicines the morning of surgery with A SIP OF WATER:     1. amLODipine (NORVASC)    __X__ Use CHG Soap as directed  __X__ Stop Blood Thinners: Aspirin. You reported stopping this already.  __X__ Stop Anti-inflammatories 7 days before surgery such as Advil, Ibuprofen, Motrin, BC or Goodies Powder, Naprosyn, Naproxen, Aleve, Aspirin, Meloxicam. May take Tylenol if needed for pain or discomfort.   __X__ Do not start taking any new herbal supplements or vitamins prior to  your procedure.  __X__ Stop the following herbal supplements or vitamins:   Omega-3 Fatty Acids (FISH OIL)  Wear comfortable clothing (specific to your surgery type) to the hospital.  Plan for stool softeners for home use; pain medications have a tendency to cause constipation. You can also help prevent constipation by eating foods high in fiber such as fruits and vegetables and drinking plenty of fluids as your diet allows.  After surgery, you can prevent lung complications by doing breathing exercises.Take deep breaths and cough every 1-2 hours. Your doctor may order a device called an Incentive Spirometer to help you take deep breaths.  Please call the [facility] at [phone] if you have any questions about these instructions.

## 2020-03-13 ENCOUNTER — Ambulatory Visit
Admission: RE | Admit: 2020-03-13 | Discharge: 2020-03-13 | Disposition: A | Discharge status: Home / Self Care | Payer: BC Managed Care – PPO | Source: Ambulatory Visit | Attending: General Surgery | Admitting: General Surgery

## 2020-03-13 ENCOUNTER — Other Ambulatory Visit: Payer: Self-pay

## 2020-03-13 ENCOUNTER — Other Ambulatory Visit
Admission: RE | Admit: 2020-03-13 | Discharge: 2020-03-13 | Disposition: A | Discharge status: Home / Self Care | Payer: BC Managed Care – PPO | Source: Ambulatory Visit | Attending: General Surgery | Admitting: General Surgery

## 2020-03-13 ENCOUNTER — Ambulatory Visit: Payer: BC Managed Care – PPO | Admitting: Dermatology

## 2020-03-13 DIAGNOSIS — I10 Essential (primary) hypertension: Secondary | ICD-10-CM | POA: Insufficient documentation

## 2020-03-13 DIAGNOSIS — Z20822 Contact with and (suspected) exposure to covid-19: Secondary | ICD-10-CM | POA: Diagnosis not present

## 2020-03-13 DIAGNOSIS — Z1231 Encounter for screening mammogram for malignant neoplasm of breast: Secondary | ICD-10-CM | POA: Insufficient documentation

## 2020-03-13 DIAGNOSIS — N6322 Unspecified lump in the left breast, upper inner quadrant: Secondary | ICD-10-CM | POA: Diagnosis not present

## 2020-03-13 DIAGNOSIS — C50212 Malignant neoplasm of upper-inner quadrant of left female breast: Secondary | ICD-10-CM | POA: Insufficient documentation

## 2020-03-13 DIAGNOSIS — Z01818 Encounter for other preprocedural examination: Secondary | ICD-10-CM | POA: Diagnosis not present

## 2020-03-13 LAB — BASIC METABOLIC PANEL
Anion gap: 10 (ref 5–15)
BUN: 13 mg/dL (ref 8–23)
CO2: 27 mmol/L (ref 22–32)
Calcium: 9.6 mg/dL (ref 8.9–10.3)
Chloride: 103 mmol/L (ref 98–111)
Creatinine, Ser: 0.59 mg/dL (ref 0.44–1.00)
GFR calc Af Amer: >60 mL/min (ref >60)
GFR calc non Af Amer: >60 mL/min (ref >60)
Glucose, Bld: 91 mg/dL (ref 70–99)
Potassium: 3.9 mmol/L (ref 3.5–5.1)
Sodium: 140 mmol/L (ref 135–145)

## 2020-03-13 LAB — CBC
HCT: 36.5 % (ref 36.0–46.0)
Hemoglobin: 12.6 g/dL (ref 12.0–15.0)
MCH: 29.9 pg (ref 26.0–34.0)
MCHC: 34.5 g/dL (ref 30.0–36.0)
MCV: 86.7 fL (ref 80.0–100.0)
Platelets: 230 10*3/uL (ref 150–400)
RBC: 4.21 MIL/uL (ref 3.87–5.11)
RDW: 13.3 % (ref 11.5–15.5)
WBC: 4.6 10*3/uL (ref 4.0–10.5)
nRBC: 0 % (ref 0.0–0.2)

## 2020-03-13 LAB — SARS CORONAVIRUS 2 (TAT 6-24 HRS): SARS Coronavirus 2: NEGATIVE

## 2020-03-15 ENCOUNTER — Ambulatory Visit
Admission: RE | Admit: 2020-03-15 | Discharge: 2020-03-15 | Disposition: A | Discharge status: Home / Self Care | Payer: BC Managed Care – PPO | Source: Ambulatory Visit | Attending: General Surgery | Admitting: General Surgery

## 2020-03-15 ENCOUNTER — Encounter
Admission: RE | Disposition: A | Discharge status: Home / Self Care | Payer: Self-pay | Source: Home / Self Care | Attending: General Surgery

## 2020-03-15 ENCOUNTER — Ambulatory Visit: Payer: BC Managed Care – PPO | Admitting: Anesthesiology

## 2020-03-15 ENCOUNTER — Other Ambulatory Visit: Payer: Self-pay

## 2020-03-15 ENCOUNTER — Encounter: Payer: Self-pay | Admitting: General Surgery

## 2020-03-15 ENCOUNTER — Ambulatory Visit
Admission: RE | Admit: 2020-03-15 | Discharge: 2020-03-15 | Disposition: A | Discharge status: Home / Self Care | Payer: BC Managed Care – PPO | Attending: General Surgery | Admitting: General Surgery

## 2020-03-15 ENCOUNTER — Ambulatory Visit (HOSPITAL_BASED_OUTPATIENT_CLINIC_OR_DEPARTMENT_OTHER)
Admission: RE | Admit: 2020-03-15 | Discharge: 2020-03-15 | Disposition: A | Discharge status: Home / Self Care | Payer: BC Managed Care – PPO | Source: Ambulatory Visit | Attending: General Surgery | Admitting: General Surgery

## 2020-03-15 DIAGNOSIS — C50212 Malignant neoplasm of upper-inner quadrant of left female breast: Secondary | ICD-10-CM

## 2020-03-15 DIAGNOSIS — Z170 Estrogen receptor positive status [ER+]: Secondary | ICD-10-CM | POA: Diagnosis not present

## 2020-03-15 DIAGNOSIS — N6322 Unspecified lump in the left breast, upper inner quadrant: Secondary | ICD-10-CM | POA: Diagnosis not present

## 2020-03-15 DIAGNOSIS — I10 Essential (primary) hypertension: Secondary | ICD-10-CM | POA: Diagnosis not present

## 2020-03-15 DIAGNOSIS — Z7982 Long term (current) use of aspirin: Secondary | ICD-10-CM | POA: Diagnosis not present

## 2020-03-15 DIAGNOSIS — C50312 Malignant neoplasm of lower-inner quadrant of left female breast: Secondary | ICD-10-CM

## 2020-03-15 DIAGNOSIS — Z79899 Other long term (current) drug therapy: Secondary | ICD-10-CM | POA: Insufficient documentation

## 2020-03-15 DIAGNOSIS — E785 Hyperlipidemia, unspecified: Secondary | ICD-10-CM | POA: Diagnosis not present

## 2020-03-15 DIAGNOSIS — Z87891 Personal history of nicotine dependence: Secondary | ICD-10-CM | POA: Diagnosis not present

## 2020-03-15 DIAGNOSIS — C50912 Malignant neoplasm of unspecified site of left female breast: Secondary | ICD-10-CM | POA: Diagnosis not present

## 2020-03-15 HISTORY — PX: PARTIAL MASTECTOMY WITH NEEDLE LOCALIZATION AND AXILLARY SENTINEL LYMPH NODE BX: SHX6009

## 2020-03-15 SURGERY — PARTIAL MASTECTOMY WITH NEEDLE LOCALIZATION AND AXILLARY SENTINEL LYMPH NODE BX
Anesthesia: General | Site: Breast | Laterality: Left

## 2020-03-15 MED ORDER — METHYLENE BLUE 0.5 % INJ SOLN
INTRAVENOUS | Status: DC | PRN
  Administered 2020-03-15: 10 mL

## 2020-03-15 MED ORDER — FENTANYL CITRATE (PF) 100 MCG/2ML IJ SOLN
INTRAMUSCULAR | Status: DC | PRN
  Administered 2020-03-15 (×4): 25 ug via INTRAVENOUS

## 2020-03-15 MED ORDER — FAMOTIDINE 20 MG PO TABS
20.0000 mg | ORAL_TABLET | Freq: Once | ORAL | Status: AC
  Administered 2020-03-15: 20 mg via ORAL

## 2020-03-15 MED ORDER — ORAL CARE MOUTH RINSE: 15.0000 mL | Freq: Once | OROMUCOSAL | Status: AC

## 2020-03-15 MED ORDER — FENTANYL CITRATE (PF) 100 MCG/2ML IJ SOLN
INTRAMUSCULAR | Status: AC
  Filled 2020-03-15: qty 2

## 2020-03-15 MED ORDER — MIDAZOLAM HCL 2 MG/2ML IJ SOLN
INTRAMUSCULAR | Status: AC
  Filled 2020-03-15: qty 2

## 2020-03-15 MED ORDER — TRAMADOL HCL 50 MG PO TABS
ORAL_TABLET | ORAL | Status: AC
  Filled 2020-03-15: qty 1

## 2020-03-15 MED ORDER — LIDOCAINE HCL (CARDIAC) PF 100 MG/5ML IV SOSY
PREFILLED_SYRINGE | INTRAVENOUS | Status: DC | PRN
  Administered 2020-03-15: 60 mg via INTRAVENOUS

## 2020-03-15 MED ORDER — BUPIVACAINE-EPINEPHRINE (PF) 0.5% -1:200000 IJ SOLN
INTRAMUSCULAR | Status: AC
  Filled 2020-03-15: qty 30

## 2020-03-15 MED ORDER — TECHNETIUM TC 99M SULFUR COLLOID FILTERED: 0.7230 | Freq: Once | INTRAVENOUS | Status: DC | PRN

## 2020-03-15 MED ORDER — DEXAMETHASONE SODIUM PHOSPHATE 10 MG/ML IJ SOLN
INTRAMUSCULAR | Status: DC | PRN
  Administered 2020-03-15: 5 mg via INTRAVENOUS

## 2020-03-15 MED ORDER — FENTANYL CITRATE (PF) 100 MCG/2ML IJ SOLN
25.0000 ug | INTRAMUSCULAR | Status: AC | PRN
  Administered 2020-03-15 (×8): 25 ug via INTRAVENOUS

## 2020-03-15 MED ORDER — DEXMEDETOMIDINE HCL IN NACL 80 MCG/20ML IV SOLN
INTRAVENOUS | Status: AC
  Filled 2020-03-15: qty 20

## 2020-03-15 MED ORDER — BUPIVACAINE-EPINEPHRINE (PF) 0.5% -1:200000 IJ SOLN
INTRAMUSCULAR | Status: DC | PRN
  Administered 2020-03-15: 30 mL

## 2020-03-15 MED ORDER — PROPOFOL 500 MG/50ML IV EMUL
INTRAVENOUS | Status: AC
  Filled 2020-03-15: qty 50

## 2020-03-15 MED ORDER — LACTATED RINGERS IV SOLN: INTRAVENOUS | Status: DC

## 2020-03-15 MED ORDER — PHENYLEPHRINE HCL (PRESSORS) 10 MG/ML IV SOLN
INTRAVENOUS | Status: DC | PRN
  Administered 2020-03-15: 100 ug via INTRAVENOUS

## 2020-03-15 MED ORDER — SCOPOLAMINE 1 MG/3DAYS TD PT72
1.0000 | MEDICATED_PATCH | TRANSDERMAL | Status: DC
  Administered 2020-03-15: 1.5 mg via TRANSDERMAL

## 2020-03-15 MED ORDER — CHLORHEXIDINE GLUCONATE 0.12 % MT SOLN
15.0000 mL | Freq: Once | OROMUCOSAL | Status: AC
  Administered 2020-03-15: 15 mL via OROMUCOSAL

## 2020-03-15 MED ORDER — CEFAZOLIN SODIUM-DEXTROSE 2-4 GM/100ML-% IV SOLN
2.0000 g | INTRAVENOUS | Status: AC
  Administered 2020-03-15: 2 g via INTRAVENOUS

## 2020-03-15 MED ORDER — ONDANSETRON HCL 4 MG/2ML IJ SOLN
INTRAMUSCULAR | Status: DC | PRN
  Administered 2020-03-15: 4 mg via INTRAVENOUS

## 2020-03-15 MED ORDER — EPHEDRINE 5 MG/ML INJ
INTRAVENOUS | Status: AC
  Filled 2020-03-15: qty 10

## 2020-03-15 MED ORDER — LIDOCAINE HCL (PF) 2 % IJ SOLN
INTRAMUSCULAR | Status: AC
  Filled 2020-03-15: qty 5

## 2020-03-15 MED ORDER — MIDAZOLAM HCL 2 MG/2ML IJ SOLN
INTRAMUSCULAR | Status: DC | PRN
  Administered 2020-03-15: 2 mg via INTRAVENOUS

## 2020-03-15 MED ORDER — ONDANSETRON HCL 4 MG/2ML IJ SOLN
INTRAMUSCULAR | Status: AC
  Filled 2020-03-15: qty 2

## 2020-03-15 MED ORDER — EPHEDRINE SULFATE 50 MG/ML IJ SOLN
INTRAMUSCULAR | Status: DC | PRN
  Administered 2020-03-15: 10 mg via INTRAVENOUS

## 2020-03-15 MED ORDER — PROPOFOL 500 MG/50ML IV EMUL
INTRAVENOUS | Status: AC
  Filled 2020-03-15: qty 100

## 2020-03-15 MED ORDER — ONDANSETRON HCL 4 MG/2ML IJ SOLN: 4.0000 mg | Freq: Once | INTRAMUSCULAR | Status: DC | PRN

## 2020-03-15 MED ORDER — DEXAMETHASONE SODIUM PHOSPHATE 10 MG/ML IJ SOLN
INTRAMUSCULAR | Status: AC
  Filled 2020-03-15: qty 1

## 2020-03-15 MED ORDER — PROPOFOL 10 MG/ML IV BOLUS
INTRAVENOUS | Status: DC | PRN
  Administered 2020-03-15: 150 mg via INTRAVENOUS

## 2020-03-15 MED ORDER — DEXMEDETOMIDINE HCL IN NACL 200 MCG/50ML IV SOLN
INTRAVENOUS | Status: DC | PRN
Start: 2020-03-15 — End: 2020-03-15
  Administered 2020-03-15: 8 ug via INTRAVENOUS
  Administered 2020-03-15: 16 ug via INTRAVENOUS

## 2020-03-15 MED ORDER — METHYLENE BLUE 0.5 % INJ SOLN
INTRAVENOUS | Status: AC
  Filled 2020-03-15: qty 10

## 2020-03-15 MED ORDER — TRAMADOL HCL 50 MG PO TABS: 50.0000 mg | ORAL_TABLET | Freq: Four times a day (QID) | ORAL | 0 refills | Status: AC | PRN

## 2020-03-15 MED ORDER — TRAMADOL HCL 50 MG PO TABS
50.0000 mg | ORAL_TABLET | Freq: Once | ORAL | Status: AC
  Administered 2020-03-15: 50 mg via ORAL
  Filled 2020-03-15: qty 1

## 2020-03-15 MED ORDER — PROPOFOL 500 MG/50ML IV EMUL
INTRAVENOUS | Status: DC | PRN
  Administered 2020-03-15: 165 ug/kg/min via INTRAVENOUS

## 2020-03-15 SURGICAL SUPPLY — 51 items
ADH SKN CLS APL DERMABOND .7 (GAUZE/BANDAGES/DRESSINGS) ×2
APL PRP STRL LF DISP 70% ISPRP (MISCELLANEOUS) ×1
BINDER BREAST LRG (GAUZE/BANDAGES/DRESSINGS) IMPLANT
BINDER BREAST MEDIUM (GAUZE/BANDAGES/DRESSINGS) IMPLANT
BINDER BREAST XLRG (GAUZE/BANDAGES/DRESSINGS) IMPLANT
BINDER BREAST XXLRG (GAUZE/BANDAGES/DRESSINGS) IMPLANT
BLADE SURG 15 STRL LF DISP TIS (BLADE) ×2 IMPLANT
BLADE SURG 15 STRL SS (BLADE) ×4
CANISTER SUCT 1200ML W/VALVE (MISCELLANEOUS) ×2 IMPLANT
CHLORAPREP W/TINT 26 (MISCELLANEOUS) ×2 IMPLANT
CNTNR SPEC 2.5X3XGRAD LEK (MISCELLANEOUS)
CONT SPEC 4OZ STER OR WHT (MISCELLANEOUS)
CONT SPEC 4OZ STRL OR WHT (MISCELLANEOUS)
CONTAINER SPEC 2.5X3XGRAD LEK (MISCELLANEOUS) IMPLANT
COVER WAND RF STERILE (DRAPES) ×2 IMPLANT
DERMABOND ADVANCED (GAUZE/BANDAGES/DRESSINGS) ×2
DERMABOND ADVANCED .7 DNX12 (GAUZE/BANDAGES/DRESSINGS) ×1 IMPLANT
DEVICE DUBIN SPECIMEN MAMMOGRA (MISCELLANEOUS) ×2 IMPLANT
DRAPE LAPAROTOMY TRNSV 106X77 (MISCELLANEOUS) ×2 IMPLANT
DRSG GAUZE FLUFF 36X18 (GAUZE/BANDAGES/DRESSINGS) IMPLANT
ELECT CAUTERY BLADE 6.4 (BLADE) ×2 IMPLANT
ELECT REM PT RETURN 9FT ADLT (ELECTROSURGICAL) ×2
ELECTRODE REM PT RTRN 9FT ADLT (ELECTROSURGICAL) ×1 IMPLANT
GLOVE BIO SURGEON STRL SZ 6.5 (GLOVE) ×6 IMPLANT
GLOVE BIOGEL PI IND STRL 6.5 (GLOVE) ×1 IMPLANT
GLOVE BIOGEL PI INDICATOR 6.5 (GLOVE) ×1
GOWN STRL REUS W/ TWL LRG LVL3 (GOWN DISPOSABLE) ×2 IMPLANT
GOWN STRL REUS W/TWL LRG LVL3 (GOWN DISPOSABLE) ×6
KIT MARKER MARGIN INK (KITS) ×1 IMPLANT
KIT TURNOVER KIT A (KITS) ×2 IMPLANT
LABEL OR SOLS (LABEL) ×2 IMPLANT
MARGIN MAP 10MM (MISCELLANEOUS) ×1 IMPLANT
MARKER MARGIN CORRECT CLIP (MARKER) ×1 IMPLANT
NDL HYPO 25X1 1.5 SAFETY (NEEDLE) ×1 IMPLANT
NEEDLE HYPO 22GX1.5 SAFETY (NEEDLE) ×2 IMPLANT
NEEDLE HYPO 25X1 1.5 SAFETY (NEEDLE) ×2 IMPLANT
PACK BASIN MINOR (MISCELLANEOUS) ×2 IMPLANT
RETRACTOR RING XSMALL (MISCELLANEOUS) IMPLANT
RTRCTR WOUND ALEXIS 13CM XS SH (MISCELLANEOUS)
SLEVE PROBE SENORX GAMMA FIND (MISCELLANEOUS) ×2 IMPLANT
SUT ETHILON 3-0 FS-10 30 BLK (SUTURE)
SUT MNCRL 4-0 (SUTURE) ×2
SUT MNCRL 4-0 27XMFL (SUTURE) ×1
SUT SILK 2 0 SH (SUTURE) ×2 IMPLANT
SUT VIC AB 3-0 SH 27 (SUTURE) ×4
SUT VIC AB 3-0 SH 27X BRD (SUTURE) ×2 IMPLANT
SUTURE EHLN 3-0 FS-10 30 BLK (SUTURE) ×1 IMPLANT
SUTURE MNCRL 4-0 27XMF (SUTURE) ×2 IMPLANT
SYR 10ML LL (SYRINGE) ×4 IMPLANT
SYR BULB IRRIG 60ML STRL (SYRINGE) ×2 IMPLANT
WATER STERILE IRR 1000ML POUR (IV SOLUTION) ×2 IMPLANT

## 2020-03-15 NOTE — Transfer of Care (Signed)
Immediate Anesthesia Transfer of Care Note  Patient: Brittney Johnson  Procedure(s) Performed: PARTIAL MASTECTOMY WITH RF TAG PLACEMENT AND AXILLARY SENTINEL LYMPH NODE BX (Left Breast)  Patient Location: PACU  Anesthesia Type:General  Level of Consciousness: awake and alert   Airway & Oxygen Therapy: Patient Spontanous Breathing and Patient connected to nasal cannula oxygen  Post-op Assessment: Report given to RN and Post -op Vital signs reviewed and stable  Post vital signs: Reviewed and stable  Last Vitals:  Vitals Value Taken Time  BP 124/50 03/15/20 1228  Temp    Pulse 73 03/15/20 1230  Resp 14 03/15/20 1230  SpO2 100 % 03/15/20 1230  Vitals shown include unvalidated device data.  Last Pain:  Vitals:   03/15/20 0934  TempSrc: Temporal  PainSc: 0-No pain         Complications: No complications documented.

## 2020-03-15 NOTE — Interval H&P Note (Signed)
History and Physical Interval Note:  03/15/2020 10:08 AM  Brittney Johnson  has presented today for surgery, with the diagnosis of C50.212 Malignant neoplasm of upper-inner quadrant of female left breast.  The various methods of treatment have been discussed with the patient and family. After consideration of risks, benefits and other options for treatment, the patient has consented to  Procedure(s): PARTIAL MASTECTOMY WITH RF TAG PLACEMENT AND AXILLARY SENTINEL LYMPH NODE BX (Left) as a surgical intervention.  The patient's history has been reviewed, patient examined, no change in status, stable for surgery.  I have reviewed the patient's chart and labs.  Left breast marked in the pre procedure room. Questions were answered to the patient's satisfaction.     Herbert Pun

## 2020-03-15 NOTE — Anesthesia Postprocedure Evaluation (Signed)
Anesthesia Post Note  Patient: Brittney Johnson  Procedure(s) Performed: PARTIAL MASTECTOMY WITH RF TAG PLACEMENT AND AXILLARY SENTINEL LYMPH NODE BX (Left Breast)  Patient location during evaluation: PACU Anesthesia Type: General Level of consciousness: awake and alert Pain management: pain level controlled Vital Signs Assessment: post-procedure vital signs reviewed and stable Respiratory status: spontaneous breathing and respiratory function stable Cardiovascular status: stable Anesthetic complications: no   No complications documented.   Last Vitals:  Vitals:   03/15/20 1258 03/15/20 1315  BP: 138/62 136/84  Pulse: 81 89  Resp: 19 18  Temp:    SpO2: 100% 100%    Last Pain:  Vitals:   03/15/20 1315  TempSrc:   PainSc: 5                  Jmichael Gille K

## 2020-03-15 NOTE — Anesthesia Procedure Notes (Signed)
Procedure Name: LMA Insertion Performed by: Fredderick Phenix, CRNA Pre-anesthesia Checklist: Patient identified, Emergency Drugs available, Suction available and Patient being monitored Patient Re-evaluated:Patient Re-evaluated prior to induction Oxygen Delivery Method: Circle system utilized Preoxygenation: Pre-oxygenation with 100% oxygen Induction Type: IV induction Ventilation: Mask ventilation without difficulty LMA Size: 3.5 Tube type: Oral Number of attempts: 1 Airway Equipment and Method: Oral airway Placement Confirmation: positive ETCO2 and breath sounds checked- equal and bilateral Tube secured with: Tape Dental Injury: Teeth and Oropharynx as per pre-operative assessment

## 2020-03-15 NOTE — Discharge Instructions (Signed)
°  Diet: Resume home heart healthy regular diet.  ° °Activity: Increase activity as tolerated. Light activity and walking are encouraged. Do not drive or drink alcohol if taking narcotic pain medications. ° °Wound care: May shower with soapy water and pat dry (do not rub incisions), but no baths or submerging incision underwater until follow-up. (no swimming)  ° °Medications: Resume all home medications. For mild to moderate pain: acetaminophen (Tylenol) or ibuprofen (if no kidney disease). Combining Tylenol with alcohol can substantially increase your risk of causing liver disease. Narcotic pain medications, if prescribed, can be used for severe pain, though may cause nausea, constipation, and drowsiness. Do not combine Tylenol and Norco within a 6 hour period as Norco contains Tylenol. If you do not need the narcotic pain medication, you do not need to fill the prescription. ° °Call office ([phone]) at any time if any questions, worsening pain, fevers/chills, bleeding, drainage from incision site, or other concerns. ° °AMBULATORY SURGERY  °DISCHARGE INSTRUCTIONS ° ° °1) The drugs that you were given will stay in your system until tomorrow so for the next 24 hours you should not: ° °A) Drive an automobile °B) Make any legal decisions °C) Drink any alcoholic beverage ° ° °2) You may resume regular meals tomorrow.  Today it is better to start with liquids and gradually work up to solid foods. ° °You may eat anything you prefer, but it is better to start with liquids, then soup and crackers, and gradually work up to solid foods. ° ° °3) Please notify your doctor immediately if you have any unusual bleeding, trouble breathing, redness and pain at the surgery site, drainage, fever, or pain not relieved by medication. ° ° ° °4) Additional Instructions: ° ° ° ° ° ° ° °Please contact your physician with any problems or Same Day Surgery at [phone], Monday through Friday 6 am to 4 pm, or [place] [facility]  number at [phone]. °

## 2020-03-15 NOTE — Anesthesia Preprocedure Evaluation (Signed)
Anesthesia Evaluation  Patient identified by MRN, date of birth, ID band Patient awake    Reviewed: Allergy & Precautions, NPO status , Patient's Chart, lab work & pertinent test results  History of Anesthesia Complications (+) PONV and history of anesthetic complications  Airway Mallampati: II       Dental   Pulmonary neg sleep apnea, neg COPD, Not current smoker, former smoker,           Cardiovascular hypertension, Pt. on medications (-) Past MI and (-) CHF (-) dysrhythmias (-) Valvular Problems/Murmurs     Neuro/Psych Anxiety    GI/Hepatic Neg liver ROS, neg GERD  ,  Endo/Other  neg diabetes  Renal/GU negative Renal ROS     Musculoskeletal   Abdominal   Peds  Hematology   Anesthesia Other Findings   Reproductive/Obstetrics                             Anesthesia Physical Anesthesia Plan  ASA: II  Anesthesia Plan: General   Post-op Pain Management:    Induction: Intravenous  PONV Risk Score and Plan: 4 or greater and Ondansetron, Dexamethasone, Midazolam and Scopolamine patch - Pre-op  Airway Management Planned: LMA  Additional Equipment:   Intra-op Plan:   Post-operative Plan:   Informed Consent: I have reviewed the patients History and Physical, chart, labs and discussed the procedure including the risks, benefits and alternatives for the proposed anesthesia with the patient or authorized representative who has indicated his/her understanding and acceptance.       Plan Discussed with:   Anesthesia Plan Comments:         Anesthesia Quick Evaluation

## 2020-03-15 NOTE — Op Note (Signed)
Preoperative diagnosis: Left breast carcinoma.  Postoperative diagnosis: Left breast carcinoma..   Procedure: Left radiofrequency tag-localized partial mastectomy.                       Left Axillary Sentinel Lymph node biopsy  Anesthesia: GETA  Surgeon: Dr. Windell Moment  Wound Classification: Clean  Indications: Patient is a 64 y.o. female with a nonpalpable left breast mass noted on mammography with core biopsy demonstrating invasive mammary carcinoma requires radiofrequency tag-localized partial mastectomy for treatment with sentinel lymph node biopsy.   Findings: 1. Specimen mammography shows marker and tag on specimen 2. Pathology call refers gross examination of margins was clear 3. No other palpable mass or lymph node identified.   Description of procedure: Preoperative radiofrequency tag localization was performed by radiology. In the nuclear medicine suite, the subareolar region was injected with Tc-99 sulfur colloid. Localization studies were reviewed. The patient was taken to the operating room and placed supine on the operating table, and after general anesthesia, I personally injected methylene blue subdermally for intra operative mapping of sentinel lymph node. The left chest and axilla were prepped and draped in the usual sterile fashion. A time-out was completed verifying correct patient, procedure, site, positioning, and implant(s) and/or special equipment prior to beginning this procedure.  By comparing the localization studies and interrogation with Localizer device, the probable trajectory and location of the mass was visualized. A circumareolar skin incision was planned in such a way as to minimize the amount of dissection to reach the mass.  The skin incision was made. Flaps were raised and the location of the tag was confirmed with Localizer device confirmed. A 2-0 silk figure-of-eight stay suture was placed and used for retraction. Dissection was then taken down  circumferentially, taking care to include the entire localizing tag and a wide margin of grossly normal tissue. The specimen and entire localizing tag were removed. The specimen was oriented and sent to radiology with the localization studies. Confirmation was received that the entire target lesion had been resected. The wound was irrigated. Hemostasis was checked. The wound was closed with interrupted sutures of 3-0 Vicryl and a subcuticular suture of Monocryl 3-0. No attempt was made to close the dead space.   A hand-held gamma probe was used to identify the location of the hottest spot in the axilla. An incision was made around the caudal axillary hairline. Dissection was carried down until subdermal facias was advanced. The probe was placed and again, the point of maximal count was found. Dissection continue until nodule was identified. The probe was placed in contact with the node. The node was excised in its entirety.  An additional hot spot was detected and the node was excised in similar fashion. No additional hot spots were identified. No clinically abnormal nodes were palpated. The procedure was terminated. Hemostasis was achieved and the wound closed in layers with deep interrupted 3-0 Vicryl and skin was closed with subcuticular suture of Monocryl 3-0.  The patient tolerated the procedure well and was taken to the postanesthesia care unit in stable condition.   Specimen: Left Breast mass                     Sentinel Lymph nodes #1, #2, #3  Complications: None  Estimated Blood Loss: 10 mL

## 2020-03-16 ENCOUNTER — Encounter: Payer: Self-pay | Admitting: General Surgery

## 2020-03-17 ENCOUNTER — Encounter: Payer: Self-pay | Admitting: *Deleted

## 2020-03-17 ENCOUNTER — Other Ambulatory Visit: Payer: Self-pay | Admitting: *Deleted

## 2020-03-17 NOTE — Progress Notes (Signed)
Called patient to follow up post surgery.  States she is still having some pain, but is using Tylenol for control since she does not like how the "Tramadol makes me feel."  She asked about her genetic testing.  Referral was place to genetics, but no appointment was made.  Message sent to our referral coordinator and our genetics counselor, Denton Ar who will be back in the office on 03/27/20.  Patient informed she should get an appointment after her return.  She is to follow up with Dr. Rogue Bussing on 03/22/20 @ 9:30.

## 2020-03-20 ENCOUNTER — Ambulatory Visit (INDEPENDENT_AMBULATORY_CARE_PROVIDER_SITE_OTHER): Payer: BC Managed Care – PPO | Admitting: Otolaryngology

## 2020-03-22 ENCOUNTER — Other Ambulatory Visit: Payer: Self-pay

## 2020-03-22 ENCOUNTER — Inpatient Hospital Stay (HOSPITAL_BASED_OUTPATIENT_CLINIC_OR_DEPARTMENT_OTHER): Payer: BC Managed Care – PPO | Admitting: Internal Medicine

## 2020-03-22 ENCOUNTER — Encounter: Payer: Self-pay | Admitting: Internal Medicine

## 2020-03-22 ENCOUNTER — Telehealth: Payer: Self-pay | Admitting: Internal Medicine

## 2020-03-22 DIAGNOSIS — Z79899 Other long term (current) drug therapy: Secondary | ICD-10-CM | POA: Diagnosis not present

## 2020-03-22 DIAGNOSIS — I10 Essential (primary) hypertension: Secondary | ICD-10-CM | POA: Diagnosis not present

## 2020-03-22 DIAGNOSIS — Z170 Estrogen receptor positive status [ER+]: Secondary | ICD-10-CM

## 2020-03-22 DIAGNOSIS — C50212 Malignant neoplasm of upper-inner quadrant of left female breast: Secondary | ICD-10-CM | POA: Diagnosis not present

## 2020-03-22 DIAGNOSIS — Z7982 Long term (current) use of aspirin: Secondary | ICD-10-CM | POA: Diagnosis not present

## 2020-03-22 DIAGNOSIS — M8580 Other specified disorders of bone density and structure, unspecified site: Secondary | ICD-10-CM | POA: Diagnosis not present

## 2020-03-22 DIAGNOSIS — E785 Hyperlipidemia, unspecified: Secondary | ICD-10-CM | POA: Diagnosis not present

## 2020-03-22 DIAGNOSIS — Z87891 Personal history of nicotine dependence: Secondary | ICD-10-CM | POA: Diagnosis not present

## 2020-03-22 DIAGNOSIS — Z803 Family history of malignant neoplasm of breast: Secondary | ICD-10-CM | POA: Diagnosis not present

## 2020-03-22 DIAGNOSIS — J45909 Unspecified asthma, uncomplicated: Secondary | ICD-10-CM | POA: Diagnosis not present

## 2020-03-22 NOTE — Assessment & Plan Note (Addendum)
#  Left breast invasive mammary carcinoma clinical stage I ER/PR positive HER-2 negative; MRI-left breast approximately 2 cm mass no lymph nodes noted.  Patient status post lumpectomy sentinel lymph node-final pathology pending.  #I had a long discussion regarding the further adjuvant treatment options include chemotherapy; antihormone therapy and radiation.  #Discussed the role of chemotherapy with based upon results of Oncotype-which will be ordered after final pathology evaluated.  Clinically less likely patient will need chemotherapy.  #If chemotherapy is not recommended-patient will benefit from adjuvant radiation; given her young age I would definitely recommend adjuvant radiation.  We will make a referral after final pathology.  # #Discussed the mechanism of action of aromatase inhibitors-with blocking of estrogen to prevent breast cancer.  Also discussed the potential side effects including but not limited to arthralgias hot flashes and increased risk of osteoporosis.  Discussed regarding treatment of hot flashes/osteoporosis prevention in general.   #BMD OCT 2020- T score+ -1.4-osteopenia  #Genetic counseling-patient's father side of family unknown; will refer to genetics.  # DISPOSITION: will call pathology # Follow up TBD- Dr.B

## 2020-03-22 NOTE — Progress Notes (Signed)
[facility] NOTE  Patient Care Team: Tower, Wynelle Fanny, MD as PCP - General  CHIEF COMPLAINTS/PURPOSE OF CONSULTATION: Breast cancer  #  Oncology History Overview Note  # LEFT BREAST CA- INVASIVE MAMMARY CARCINOMA, NO SPECIAL TYPE, WITH MICROPAPILLARY  FEATURES; ER/PR Positive [90%]; Her 2 NEG; G-1; 0.8 x 0.7 x 1 cm.   # # SURVIVORSHIP:   # GENETICS:   DIAGNOSIS:   STAGE:         ;  GOALS:  CURRENT/MOST RECENT THERAPY :     Carcinoma of upper-inner quadrant of left breast in female, estrogen receptor positive ([place])  03/01/2020 Initial Diagnosis   Carcinoma of upper-inner quadrant of left breast in female, estrogen receptor positive ([place])      HISTORY OF PRESENTING ILLNESS:  Brittney Johnson 64 y.o.  female diagnosis of ER/PR positive HER-2 negative breast cancer is here for follow-up.  Patient underwent lumpectomy with sentinel lymph node biopsy exactly 1 week ago.  She is here to review the pathology.  Patient denies any postoperative complications.  She is obviously nervous about the pathology results.  Review of Systems  Constitutional: Negative for chills, diaphoresis, fever, malaise/fatigue and weight loss.  HENT: Negative for nosebleeds and sore throat.   Eyes: Negative for double vision.  Respiratory: Negative for cough, hemoptysis, sputum production, shortness of breath and wheezing.   Cardiovascular: Negative for chest pain, palpitations, orthopnea and leg swelling.  Gastrointestinal: Negative for abdominal pain, blood in stool, constipation, diarrhea, heartburn, melena, nausea and vomiting.  Genitourinary: Negative for dysuria, frequency and urgency.  Musculoskeletal: Negative for back pain and joint pain.  Skin: Negative.  Negative for itching and rash.  Neurological: Negative for dizziness, tingling, focal weakness, weakness and headaches.  Endo/Heme/Allergies: Does not bruise/bleed easily.  Psychiatric/Behavioral: Negative for depression.  The patient is not nervous/anxious and does not have insomnia.      MEDICAL HISTORY:  Past Medical History:  Diagnosis Date  . Atypical pneumonia 1998   not hosp  . DDD (degenerative disc disease), cervical   . Hyperlipidemia   . Hypertension   . Low back pain    on and off  . PONV (postoperative nausea and vomiting)    due to some sedations!  . Syncope and collapse 07/16/2007    SURGICAL HISTORY: Past Surgical History:  Procedure Laterality Date  . ABDOMINAL HYSTERECTOMY     supracervical- L ovary remains  . BREAST BIOPSY Left 02/22/2020   Q clip [place] bx, [facility]  . CARDIOVASCULAR STRESS TEST     08/28/06 normal   . left hand surgery Left 2016   Dupuytrens  . LUMBAR DISC SURGERY  08/2017   [facility]  . OOPHORECTOMY Right    Right ovary removed   . PARTIAL MASTECTOMY WITH NEEDLE LOCALIZATION AND AXILLARY SENTINEL LYMPH NODE BX Left 03/15/2020   Procedure: PARTIAL MASTECTOMY WITH RF TAG PLACEMENT AND AXILLARY SENTINEL LYMPH NODE BX;  Surgeon: Herbert Pun, MD;  Location: [facility] ORS;  Service: General;  Laterality: Left;  . TUBAL LIGATION     ? 1979    SOCIAL HISTORY: Social History   Socioeconomic History  . Marital status: Married    Spouse name: Not on file  . Number of children: Not on file  . Years of education: Not on file  . Highest education level: Not on file  Occupational History  . Occupation: Adult nurse: [employer]  Tobacco Use  . Smoking status: Former Smoker  Quit date: 07/29/1997    Years since quitting: 22.6  . Smokeless tobacco: Never Used  Substance and Sexual Activity  . Alcohol use: Yes    Alcohol/week: 0.0 standard drinks    Comment: occ  . Drug use: No  . Sexual activity: Not on file  Other Topics Concern  . Not on file  Social History Narrative   Kids live close by, 3 grandkids- really enjoys, runs for exercise, ran a 5k March 2011; walks; quit 20 years; rare alcohol. Work for [employer]; lives in  [place]. 2 boys- 61s.    Social Determinants of Health   Financial Resource Strain:   . Difficulty of Paying Living Expenses: Not on file  Food Insecurity:   . Worried About Charity fundraiser in the Last Year: Not on file  . Ran Out of Food in the Last Year: Not on file  Transportation Needs:   . Lack of Transportation (Medical): Not on file  . Lack of Transportation (Non-Medical): Not on file  Physical Activity:   . Days of Exercise per Week: Not on file  . Minutes of Exercise per Session: Not on file  Stress:   . Feeling of Stress : Not on file  Social Connections:   . Frequency of Communication with Friends and Family: Not on file  . Frequency of Social Gatherings with Friends and Family: Not on file  . Attends Religious Services: Not on file  . Active Member of Clubs or Organizations: Not on file  . Attends Archivist Meetings: Not on file  . Marital Status: Not on file  Intimate Partner Violence:   . Fear of Current or Ex-Partner: Not on file  . Emotionally Abused: Not on file  . Physically Abused: Not on file  . Sexually Abused: Not on file    FAMILY HISTORY: Family History  Problem Relation Age of Onset  . Lung cancer Father   . Hyperlipidemia Mother   . Diabetes Mother   . Hypertension Mother   . Stroke Mother   . Heart disease Other   . Lung cancer Other        uncle  . Pancreatic cancer Other        unspecified family member  . Diabetes Other        aunt  . Coronary artery disease Maternal Grandfather   . Breast cancer Maternal Aunt   . Thyroid disease Neg Hx     ALLERGIES:  is allergic to codeine and bee venom.  MEDICATIONS:  Current Outpatient Medications  Medication Sig Dispense Refill  . amLODipine (NORVASC) 5 MG tablet Take 1 tablet (5 mg total) by mouth daily. 90 tablet 3  . aspirin 81 MG EC tablet Take 81 mg by mouth every Monday, Wednesday, and Friday.     Marland Kitchen atorvastatin (LIPITOR) 20 MG tablet Take 1 tablet (20 mg total) by  mouth daily. 90 tablet 3  . lidocaine-prilocaine (EMLA) cream Apply 1 application topically daily.    Marland Kitchen Lifitegrast (XIIDRA) 5 % SOLN Place 1 drop into both eyes at bedtime.     Marland Kitchen lisinopril (ZESTRIL) 10 MG tablet Take 1 tablet (10 mg total) by mouth daily. 90 tablet 3  . Multiple Minerals-Vitamins (CALCIUM-MAGNESIUM-ZINC-D3) TABS Take 2 tablets by mouth daily.    . Multiple Vitamin (MULTIVITAMIN) tablet Take 1 tablet by mouth daily.    . Omega-3 Fatty Acids (FISH OIL) 1200 MG CAPS Take 1,200 mg by mouth 2 (two) times daily.     Marland Kitchen  albuterol (VENTOLIN HFA) 108 (90 BASE) MCG/ACT inhaler Inhale 2 puffs into the lungs every 4 (four) hours as needed for wheezing. 2 puffs prior to exercise as needed. (Patient not taking: Reported on 03/01/2020) 1 Inhaler 3  . Calcium Carbonate-Vit D-Min 600-400 MG-UNIT TABS Take 2 tablets by mouth.  (Patient not taking: Reported on 03/08/2020)    . MAXITROL 3.5-10000-0.1 ophthalmic suspension Place 1 drop into the left eye 4 (four) times daily. (Patient not taking: Reported on 03/01/2020)     No current facility-administered medications for this visit.      Marland Kitchen  PHYSICAL EXAMINATION: ECOG PERFORMANCE STATUS: 0 - Asymptomatic  Vitals:   03/22/20 0944  BP: (!) 144/72  Pulse: 82  Resp: 16  Temp: 98.4 F (36.9 C)  SpO2: 100%   Filed Weights   03/22/20 0944  Weight: 148 lb (67.1 kg)    Physical Exam HENT:     Head: Normocephalic and atraumatic.     Mouth/Throat:     Pharynx: No oropharyngeal exudate.  Eyes:     Pupils: Pupils are equal, round, and reactive to light.  Cardiovascular:     Rate and Rhythm: Normal rate and regular rhythm.  Pulmonary:     Effort: Pulmonary effort is normal. No respiratory distress.     Breath sounds: Normal breath sounds. No wheezing.  Abdominal:     General: Bowel sounds are normal. There is no distension.     Palpations: Abdomen is soft. There is no mass.     Tenderness: There is no abdominal tenderness. There is no  guarding or rebound.  Musculoskeletal:        General: No tenderness. Normal range of motion.     Cervical back: Normal range of motion and neck supple.  Skin:    General: Skin is warm.  Neurological:     Mental Status: She is alert and oriented to person, place, and time.  Psychiatric:        Mood and Affect: Affect normal.    LABORATORY DATA:  I have reviewed the data as listed Lab Results  Component Value Date   WBC 4.6 03/13/2020   HGB 12.6 03/13/2020   HCT 36.5 03/13/2020   MCV 86.7 03/13/2020   PLT 230 03/13/2020   Recent Labs    04/16/19 0930 03/13/20 0918  NA 138 140  K 4.4 3.9  CL 101 103  CO2 30 27  GLUCOSE 88 91  BUN 14 13  CREATININE 0.71 0.59  CALCIUM 9.9 9.6  GFRNONAA  --  >60  GFRAA  --  >60  PROT 7.1  --   ALBUMIN 4.5  --   AST 15  --   ALT 18  --   ALKPHOS 48  --   BILITOT 0.4  --     RADIOGRAPHIC STUDIES: I have personally reviewed the radiological images as listed and agreed with the findings in the report. MR BREAST BILATERAL W WO CONTRAST INC CAD  Result Date: 03/07/2020 CLINICAL DATA:  Recently diagnosed invasive mammary carcinoma in the 10 o'clock position of the left breast. MRI recommended due to heterogeneously dense breast parenchyma. LABS:  None obtained on site today. EXAM: BILATERAL BREAST MRI WITH AND WITHOUT CONTRAST TECHNIQUE: Multiplanar, multisequence MR images of both breasts were obtained prior to and following the intravenous administration of 6 ml of Gadavist Three-dimensional MR images were rendered by post-processing of the original MR data on an independent workstation. The three-dimensional MR images were interpreted, and findings are reported  in the following complete MRI report for this study. Three dimensional images were evaluated at the independent DynaCad workstation COMPARISON:  Previous mammogram, ultrasound and biopsy examinations. FINDINGS: Breast composition: c. Heterogeneous fibroglandular tissue. Background  parenchymal enhancement: Minimal Right breast: No mass or abnormal enhancement. Left breast: Biopsy marker clip artifact in the recently biopsied mass in the posterior 10 o'clock position of the left breast. The mass measures 1.9 x 1.0 cm on image number 77 series 12 and 1.2 cm in cephalocaudal dimension. This has a mixture of enhancement kinetics, including rapid wash-in/washout. Lymph nodes: No abnormal appearing lymph nodes. Ancillary findings: There is a 6 mm oval, enhancing mass in the sternum on the left on image number 45 series 16. This has high signal intensity on the T2 inversion recovery image and intermediate signal intensity on the non fat saturation T1 weighted images, similar to adjacent marrow. IMPRESSION: 1. 1.9 x 1.2 x 1.0 cm biopsy-proven malignancy in the 10 o'clock position of the left breast. 2. No evidence of malignancy elsewhere in either breast and no adenopathy. 3. 6 mm indeterminate mass in the sternum on the left. This most likely represents a hemangioma. RECOMMENDATION: 1. Treatment plan for known malignancy in the 10 o'clock position of the left breast. 2. Consider a radionuclide bone scan to exclude bony metastatic disease. BI-RADS CATEGORY  6: Known biopsy-proven malignancy. Electronically Signed   By: Claudie Revering M.D.   On: 03/07/2020 16:02   NM SENTINEL NODE INJECTION  Result Date: 03/15/2020 CLINICAL DATA:  Left breast cancer. EXAM: NUCLEAR MEDICINE BREAST LYMPHOSCINTIGRAPHY TECHNIQUE: Intradermal injection of radiopharmaceutical was performed at the 12 o'clock, 3 o'clock, 6 o'clock, and 9 o'clock positions around the left nipple. The patient was then sent to the operating room where the sentinel node(s) were identified and removed by the surgeon. RADIOPHARMACEUTICALS:  Total of 1 mCi Millipore-filtered Technetium-29msulfur colloid, injected in four aliquots of 0.25 mCi each. IMPRESSION: Uncomplicated intradermal injection of a total of 1 mCi Technetium-935mulfur colloid for  purposes of sentinel node identification. Electronically Signed   By: JaCorrie Mckusick.O.   On: 03/15/2020 12:02   MM Breast Surgical Specimen  Result Date: 03/15/2020 CLINICAL DATA:  Status post RF localized left breast lumpectomy. EXAM: SPECIMEN RADIOGRAPH OF THE  BREAST COMPARISON:  Previous exam(s). FINDINGS: Status post excision of the left breast. The RF reflector and Q shaped clip are present within the specimen. IMPRESSION: Specimen radiograph of the left breast. Electronically Signed   By: DiLillia Mountain.D.   On: 03/15/2020 11:52   MM CLIP PLACEMENT LEFT  Result Date: 02/22/2020 CLINICAL DATA:  [age]ear old female status post ultrasound-guided biopsy of the left breast. EXAM: DIAGNOSTIC LEFT MAMMOGRAM POST ULTRASOUND BIOPSY COMPARISON:  Previous exam(s). FINDINGS: Mammographic images were obtained following ultrasound guided biopsy of the left breast. The biopsy marking clip is in expected position at the site of biopsy. IMPRESSION: Appropriate positioning of the Q shaped shaped biopsy marking clip at the site of biopsy in the upper inner left breast. Final Assessment: Post Procedure Mammograms for Marker Placement Electronically Signed   By: SeKristopher Oppenheim.D.   On: 02/22/2020 09:21   [place] BREAST BX W LOC DEV 1ST LESION IMG BX SPEC [place]  Addendum Date: 02/28/2020   ADDENDUM REPORT: 02/25/2020 12:50 ADDENDUM: PATHOLOGY revealed: A. BREAST, LEFT AT 10:00, 6 CM FROM THE NIPPLE; ULTRASOUND-GUIDED CORE NEEDLE BIOPSY: - INVASIVE MAMMARY CARCINOMA, NO SPECIAL TYPE, WITH MICROPAPILLARY FEATURES. At least 9 mm in this sample.  Grade 1. Ductal carcinoma in situ: Not identified. Lymphovascular invasion: Not identified. Pathology results are CONCORDANT with imaging findings, per Dr. Kristopher Oppenheim. Pathology results and recommendations below were discussed with patient by telephone on 02/24/2020. Patient reported biopsy site within normal limits with slight tenderness at the site. Post biopsy care  instructions were reviewed and questions were answered. Patient was instructed to call [facility] if any concerns or questions arise related to the biopsy. Recommendations: 1. Surgical referral. Request for surgical referral was relayed to [place] and Tanya Nones RN at [facility] by Electa Sniff RN on 02/24/2020. 2.  Bilateral breast MRI given patient's breast density. Addendum by Electa Sniff RN on 02/25/2020. Electronically Signed   By: Kristopher Oppenheim M.D.   On: 02/25/2020 12:50   Result Date: 02/28/2020 CLINICAL DATA:  64 year old female with a suspicious left breast mass. EXAM: ULTRASOUND GUIDED LEFT BREAST CORE NEEDLE BIOPSY COMPARISON:  Previous exam(s). PROCEDURE: I met with the patient and we discussed the procedure of ultrasound-guided biopsy, including benefits and alternatives. We discussed the high likelihood of a successful procedure. We discussed the risks of the procedure, including infection, bleeding, tissue injury, clip migration, and inadequate sampling. Informed written consent was given. The usual time-out protocol was performed immediately prior to the procedure. Lesion quadrant: Upper inner quadrant Using sterile technique and 1% Lidocaine as local anesthetic, under direct ultrasound visualization, a 12 gauge spring-loaded device was used to perform biopsy of a left breast mass at the 10 o'clock position using a medial approach. At the conclusion of the procedure Q shaped tissue marker clip was deployed into the biopsy cavity. Follow up 2 view mammogram was performed and dictated separately. IMPRESSION: Ultrasound guided biopsy of the left breast. No apparent complications. Electronically Signed: By: Kristopher Oppenheim M.D. On: 02/22/2020 09:20   MM LT RADIO FREQUENCY TAG LOC MAMMO GUIDE  Result Date: 03/13/2020 CLINICAL DATA:  64 year old female presenting for RF tag localization of the left breast. EXAM: MAMMOGRAPHIC GUIDED RADIOFREQUENCY DEVICE  LOCALIZATION OF THE LEFT BREAST COMPARISON:  Previous exam(s) FINDINGS: Patient presents for radiofrequency device localization prior to left breast lumpectomy. I met with the patient and we discussed the procedure of radiofrequency device localization including benefits and alternatives. We discussed the high likelihood of a successful procedure. We discussed the risks of the procedure including infection, bleeding, tissue injury and further surgery. Informed, written consent was given. The usual time-out protocol was performed immediately prior to the procedure. Using mammographic guidance, sterile technique, 1% lidocaine as local anesthesia, a radiofrequency tag was used to localize a mass in the upper inner left breast using a superior approach. The follow-up mammogram images confirm that the RF device is in the expected location and are marked for Dr. Windell Moment. The patient tolerated the procedure well and was released from the [facility]. IMPRESSION: Radiofrequency device localization of the left breast. No apparent complications. Electronically Signed   By: Ammie Ferrier M.D.   On: 03/13/2020 16:45    ASSESSMENT & PLAN:   Carcinoma of upper-inner quadrant of left breast in female, estrogen receptor positive ([place]) #Left breast invasive mammary carcinoma clinical stage I ER/PR positive HER-2 negative; MRI-left breast approximately 2 cm mass no lymph nodes noted.  Patient status post lumpectomy sentinel lymph node-final pathology pending.  #I had a long discussion regarding the further adjuvant treatment options include chemotherapy; antihormone therapy and radiation.  #Discussed the role of chemotherapy with based upon results of Oncotype-which will be  ordered after final pathology evaluated.  Clinically less likely patient will need chemotherapy.  #If chemotherapy is not recommended-patient will benefit from adjuvant radiation; given her young age I would definitely recommend adjuvant  radiation.  We will make a referral after final pathology.  # #Discussed the mechanism of action of aromatase inhibitors-with blocking of estrogen to prevent breast cancer.  Also discussed the potential side effects including but not limited to arthralgias hot flashes and increased risk of osteoporosis.  Discussed regarding treatment of hot flashes/osteoporosis prevention in general.   #BMD OCT 2020- T score+ -1.4-osteopenia  #Genetic counseling-patient's father side of family unknown; will refer to genetics.  # DISPOSITION: will call pathology # Follow up TBD- Dr.B    Cammie Sickle, MD 03/22/2020 1:08 PM

## 2020-03-22 NOTE — Telephone Encounter (Signed)
I Spoke to pt re: prelim results of path- LN negative; low grade; awaiting final path. Will keep pt updated.   GB

## 2020-03-23 ENCOUNTER — Other Ambulatory Visit: Payer: Self-pay | Admitting: Internal Medicine

## 2020-03-23 LAB — SURGICAL PATHOLOGY

## 2020-03-24 ENCOUNTER — Ambulatory Visit (INDEPENDENT_AMBULATORY_CARE_PROVIDER_SITE_OTHER): Payer: BC Managed Care – PPO | Admitting: Otolaryngology

## 2020-03-24 ENCOUNTER — Telehealth: Payer: Self-pay | Admitting: Internal Medicine

## 2020-03-24 NOTE — Telephone Encounter (Signed)
Oncotype submitted per md orders  [place] - please schedule patient's follow-ups.

## 2020-03-24 NOTE — Telephone Encounter (Signed)
Spoke to patient regarding final pathology results-recommend Oncotype [discussed the low likelihood of needing chemotherapy]; need for radiation.  Heather-please order Oncotype breast  C-schedule follow-up- MD-around September 15 /16th; no labs.  Referral to radiation oncology Dx: Breast cancer

## 2020-03-29 ENCOUNTER — Telehealth: Payer: Self-pay | Admitting: *Deleted

## 2020-03-29 NOTE — Telephone Encounter (Signed)
-----   Message from Clancy Gourd sent at 03/29/2020 11:02 AM EDT ----- Regarding: PROVIDER [place] ONCOTYPE DX Hello,  I just uploaded a [place] DX to be signed into this pt's chart in Epic under the Media tab. Thanks.

## 2020-03-29 NOTE — Telephone Encounter (Signed)
PA submitted for oncotype.  Contacted patient. Updated patient on the status of the results. Patient aware of the need for prior auth. Follow-up with Dr. Rogue Bussing as scheduled. I will reach out to the patient if results are not available prior to her md apt with Dr. Rogue Bussing.

## 2020-03-30 ENCOUNTER — Inpatient Hospital Stay: Payer: BC Managed Care – PPO | Attending: Internal Medicine | Admitting: Licensed Clinical Social Worker

## 2020-03-30 ENCOUNTER — Encounter: Payer: Self-pay | Admitting: Licensed Clinical Social Worker

## 2020-03-30 ENCOUNTER — Inpatient Hospital Stay: Payer: BC Managed Care – PPO

## 2020-03-30 ENCOUNTER — Other Ambulatory Visit: Payer: Self-pay

## 2020-03-30 DIAGNOSIS — Z79899 Other long term (current) drug therapy: Secondary | ICD-10-CM | POA: Insufficient documentation

## 2020-03-30 DIAGNOSIS — I10 Essential (primary) hypertension: Secondary | ICD-10-CM | POA: Insufficient documentation

## 2020-03-30 DIAGNOSIS — Z90710 Acquired absence of both cervix and uterus: Secondary | ICD-10-CM | POA: Insufficient documentation

## 2020-03-30 DIAGNOSIS — Z9079 Acquired absence of other genital organ(s): Secondary | ICD-10-CM | POA: Insufficient documentation

## 2020-03-30 DIAGNOSIS — Z170 Estrogen receptor positive status [ER+]: Secondary | ICD-10-CM

## 2020-03-30 DIAGNOSIS — Z803 Family history of malignant neoplasm of breast: Secondary | ICD-10-CM | POA: Diagnosis not present

## 2020-03-30 DIAGNOSIS — E785 Hyperlipidemia, unspecified: Secondary | ICD-10-CM | POA: Insufficient documentation

## 2020-03-30 DIAGNOSIS — C50212 Malignant neoplasm of upper-inner quadrant of left female breast: Secondary | ICD-10-CM

## 2020-03-30 DIAGNOSIS — Z7982 Long term (current) use of aspirin: Secondary | ICD-10-CM | POA: Insufficient documentation

## 2020-03-30 DIAGNOSIS — Z1379 Encounter for other screening for genetic and chromosomal anomalies: Secondary | ICD-10-CM

## 2020-03-30 DIAGNOSIS — Z833 Family history of diabetes mellitus: Secondary | ICD-10-CM | POA: Insufficient documentation

## 2020-03-30 DIAGNOSIS — Z8249 Family history of ischemic heart disease and other diseases of the circulatory system: Secondary | ICD-10-CM | POA: Insufficient documentation

## 2020-03-30 DIAGNOSIS — Z90721 Acquired absence of ovaries, unilateral: Secondary | ICD-10-CM | POA: Insufficient documentation

## 2020-03-30 DIAGNOSIS — Z87891 Personal history of nicotine dependence: Secondary | ICD-10-CM | POA: Insufficient documentation

## 2020-03-30 DIAGNOSIS — Z801 Family history of malignant neoplasm of trachea, bronchus and lung: Secondary | ICD-10-CM | POA: Diagnosis not present

## 2020-03-30 NOTE — Progress Notes (Signed)
REFERRING PROVIDER: Cammie Sickle, MD [place],  [place] [address]  PRIMARY PROVIDER:  Abner Greenspan, MD  PRIMARY REASON FOR VISIT:  1. Carcinoma of upper-inner quadrant of left breast in female, estrogen receptor positive ([place])   2. Family history of breast cancer   3. Family history of lung cancer      HISTORY OF PRESENT ILLNESS:   Brittney Johnson, a 64 y.o. female, was seen for a [place] cancer genetics consultation at the request of Dr. Rogue Bussing due to a personal and family history of cancer.  Brittney Johnson presents to clinic today to discuss the possibility of a hereditary predisposition to cancer, genetic testing, and to further clarify her future cancer risks, as well as potential cancer risks for family members.   In 2021, at the age of 9, Brittney Johnson was diagnosed with invasive mammary carcinoma of the left breast, ER/PR+, Her2-. The treatment plan included lumpectomy, possible chemotherapy and radiation.    CANCER HISTORY:  Oncology History Overview Note  # LEFT BREAST CA- INVASIVE MAMMARY CARCINOMA, NO SPECIAL TYPE, WITH MICROPAPILLARY  FEATURES; ER/PR Positive [90%]; Her 2 NEG; G-1; 0.8 x 0.7 x 1 cm.   # # SURVIVORSHIP:   # GENETICS:   DIAGNOSIS:   STAGE:         ;  GOALS:  CURRENT/MOST RECENT THERAPY :     Carcinoma of upper-inner quadrant of left breast in female, estrogen receptor positive ([place])  03/01/2020 Initial Diagnosis   Carcinoma of upper-inner quadrant of left breast in female, estrogen receptor positive ([place])      RISK FACTORS:  Menarche was at age 33.  First live birth at age 4.  OCP use for approximately 1 month Ovaries intact: L ovary intact.  Hysterectomy: yes.  Menopausal status: postmenopausal.  HRT use: 5 years. Colonoscopy: yes; 3 polyps found on cscope 3 years ago. Mammogram within the last year: yes. Number of breast biopsies: 1. Up to date with pelvic exams: yes. Any excessive radiation exposure in the  past: no  Past Medical History:  Diagnosis Date  . Atypical pneumonia 1998   not hosp  . DDD (degenerative disc disease), cervical   . Family history of breast cancer   . Family history of lung cancer   . Hyperlipidemia   . Hypertension   . Low back pain    on and off  . PONV (postoperative nausea and vomiting)    due to some sedations!  . Syncope and collapse 07/16/2007    Past Surgical History:  Procedure Laterality Date  . ABDOMINAL HYSTERECTOMY     supracervical- L ovary remains  . BREAST BIOPSY Left 02/22/2020   Q clip [place] bx, [facility]  . CARDIOVASCULAR STRESS TEST     08/28/06 normal   . left hand surgery Left 2016   Dupuytrens  . LUMBAR DISC SURGERY  08/2017   [facility] Halsted  . OOPHORECTOMY Right    Right ovary removed   . PARTIAL MASTECTOMY WITH NEEDLE LOCALIZATION AND AXILLARY SENTINEL LYMPH NODE BX Left 03/15/2020   Procedure: PARTIAL MASTECTOMY WITH RF TAG PLACEMENT AND AXILLARY SENTINEL LYMPH NODE BX;  Surgeon: Herbert Pun, MD;  Location: [facility] ORS;  Service: General;  Laterality: Left;  . TUBAL LIGATION     ? 1979    Social History   Socioeconomic History  . Marital status: Married    Spouse name: Not on file  . Number of children: Not on file  . Years of education:  Not on file  . Highest education level: Not on file  Occupational History  . Occupation: Adult nurse: [employer]  Tobacco Use  . Smoking status: Former Smoker    Quit date: 07/29/1997    Years since quitting: 22.6  . Smokeless tobacco: Never Used  Substance and Sexual Activity  . Alcohol use: Yes    Alcohol/week: 0.0 standard drinks    Comment: occ  . Drug use: No  . Sexual activity: Not on file  Other Topics Concern  . Not on file  Social History Narrative   Kids live close by, 3 grandkids- really enjoys, runs for exercise, ran a 5k March 2011; walks; quit 20 years; rare alcohol. Work for [employer]; lives in [place]. 2 boys- 9s.    Social Determinants  of Health   Financial Resource Strain:   . Difficulty of Paying Living Expenses: Not on file  Food Insecurity:   . Worried About Charity fundraiser in the Last Year: Not on file  . Ran Out of Food in the Last Year: Not on file  Transportation Needs:   . Lack of Transportation (Medical): Not on file  . Lack of Transportation (Non-Medical): Not on file  Physical Activity:   . Days of Exercise per Week: Not on file  . Minutes of Exercise per Session: Not on file  Stress:   . Feeling of Stress : Not on file  Social Connections:   . Frequency of Communication with Friends and Family: Not on file  . Frequency of Social Gatherings with Friends and Family: Not on file  . Attends Religious Services: Not on file  . Active Member of Clubs or Organizations: Not on file  . Attends Archivist Meetings: Not on file  . Marital Status: Not on file     FAMILY HISTORY:  We obtained a detailed, 4-generation family history.  Significant diagnoses are listed below: Family History  Problem Relation Age of Onset  . Lung cancer Father 76  . Hyperlipidemia Mother   . Diabetes Mother   . Hypertension Mother   . Stroke Mother   . Heart disease Other   . Lung cancer Other        uncle  . Diabetes Other        aunt  . Coronary artery disease Maternal Grandfather   . Breast cancer Maternal Aunt        dx under 64  . Lung cancer Maternal Uncle   . Lung cancer Paternal Uncle   . Cancer Cousin        unk types  . Cancer Cousin        unk types  . Thyroid disease Neg Hx    Brittney Johnson has 2 sons, ages 24 and 67. She has 1 full sister, 57, no history of cancer. Patient also has a maternal half brother, 26, and a paternal half-brother but she does not have information about him.   Brittney Johnson mother died at 23, no cancer history. Patient had 3 maternal aunts and 3 maternal uncles. One aunt had breast cancer under the age of 4. All three uncles had cancer, she believes lung cancer, and to  her knowledge they did not smoke. Three maternal cousins had cancer but she is unsure the types. Maternal grandmother passed at 39, grandfather passed in his 63s due to heart failure.   Brittney Johnson father died at 74 due to lung cancer. He had history of smoking, worked  in a cigarette factory and also was in [employer]. Patient had 2 paternal uncles and 1 paternal aunt. Both uncles had lung cancer. A paternal cousin also had cancer, unsure type. Patient does not have information about paternal grandparents and in general has limited info about this side of the family.  Brittney Johnson is unaware of previous family history of genetic testing for hereditary cancer risks. Patient's ancestors are of [place], [place], [place], [place] European descent. There is no reported Ashkenazi Jewish ancestry. There is no known consanguinity.  GENETIC COUNSELING ASSESSMENT: Brittney Johnson is a 64 y.o. female with a personal and family history of breast cancer which is somewhat suggestive of a hereditary cancer syndrome and predisposition to cancer. We, therefore, discussed and recommended the following at today's visit.   DISCUSSION: We discussed that 5-10% of cancer is hereditary meaning that it is due to a mutation in a single gene that is passed down from generation to generation in a family. Most cases of hereditary breast cancer are associated with BRCA1/BRCA2 genes, although there are other genes associated with hereditary breast cancer as well.  We discussed that testing is beneficial for several reasons including knowing about other cancer risks, identifying potential screening and risk-reduction options that may be appropriate, and to understand if other family members could be at risk for cancer and allow them to undergo genetic testing.   We reviewed the characteristics, features and inheritance patterns of hereditary cancer syndromes. We also discussed genetic testing, including the appropriate family members  to test, the process of testing, insurance coverage and turn-around-time for results. We discussed the implications of a negative, positive and/or variant of uncertain significant result. We recommended Brittney Johnson pursue genetic testing for the [facility] Multi-Cancer gene panel.   The Multi-Cancer Panel offered by [facility] includes sequencing and/or deletion duplication testing of the following 85 genes: AIP, ALK, APC, ATM, AXIN2,BAP1,  BARD1, BLM, BMPR1A, BRCA1, BRCA2, BRIP1, CASR, CDC73, CDH1, CDK4, CDKN1B, CDKN1C, CDKN2A (p14ARF), CDKN2A (p16INK4a), CEBPA, CHEK2, CTNNA1, DICER1, DIS3L2, EGFR (c.2369C>T, p.Thr790Met variant only), EPCAM (Deletion/duplication testing only), FH, FLCN, GATA2, GPC3, GREM1 (Promoter region deletion/duplication testing only), HOXB13 (c.251G>A, p.Gly84Glu), HRAS, KIT, MAX, MEN1, MET, MITF (c.952G>A, p.Glu318Lys variant only), MLH1, MSH2, MSH3, MSH6, MUTYH, NBN, NF1, NF2, NTHL1, PALB2, PDGFRA, PHOX2B, PMS2, POLD1, POLE, POT1, PRKAR1A, PTCH1, PTEN, RAD50, RAD51C, RAD51D, RB1, RECQL4, RET, RNF43, RUNX1, SDHAF2, SDHA (sequence changes only), SDHB, SDHC, SDHD, SMAD4, SMARCA4, SMARCB1, SMARCE1, STK11, SUFU, TERC, TERT, TMEM127, TP53, TSC1, TSC2, VHL, WRN and WT1.  Based on Brittney Johnson's personal and family history of cancer, she meets medical criteria for genetic testing. Despite that she meets criteria, she may still have an out of pocket cost.   PLAN: After considering the risks, benefits, and limitations, Brittney Johnson provided informed consent to pursue genetic testing and the blood sample was sent to [facility] for analysis of the Multi-Cancer panel. Results should be available within approximately 2-3 weeks' time, at which point they will be disclosed by telephone to Brittney Johnson, as will any additional recommendations warranted by these results. Brittney Johnson will receive a summary of her genetic counseling visit and a copy of her results once available. This information will  also be available in Epic.   Brittney Johnson questions were answered to her satisfaction today. Our contact information was provided should additional questions or concerns arise. Thank you for the referral and allowing [place] to share in the care of your patient.   Faith Rogue, MS, [facility] Genetic Counselor [email or web address]  Phone: [phone]  The patient was seen for a total of 40 minutes in face-to-face genetic counseling.  Dr. Grayland Ormond was available for discussion regarding this case.   _______________________________________________________________________ For Office Staff:  Number of people involved in session: 1 Was an Intern/ student involved with case: no

## 2020-03-31 DIAGNOSIS — C50212 Malignant neoplasm of upper-inner quadrant of left female breast: Secondary | ICD-10-CM | POA: Diagnosis not present

## 2020-03-31 DIAGNOSIS — Z170 Estrogen receptor positive status [ER+]: Secondary | ICD-10-CM | POA: Diagnosis not present

## 2020-04-04 ENCOUNTER — Encounter: Payer: Self-pay | Admitting: Radiation Oncology

## 2020-04-04 ENCOUNTER — Encounter (INDEPENDENT_AMBULATORY_CARE_PROVIDER_SITE_OTHER): Payer: Self-pay | Admitting: Otolaryngology

## 2020-04-04 ENCOUNTER — Encounter: Payer: Self-pay | Admitting: Family Medicine

## 2020-04-04 ENCOUNTER — Encounter: Payer: Self-pay | Admitting: Internal Medicine

## 2020-04-04 ENCOUNTER — Other Ambulatory Visit: Payer: Self-pay

## 2020-04-04 ENCOUNTER — Ambulatory Visit (INDEPENDENT_AMBULATORY_CARE_PROVIDER_SITE_OTHER): Payer: BC Managed Care – PPO | Admitting: Otolaryngology

## 2020-04-04 ENCOUNTER — Ambulatory Visit
Admission: RE | Admit: 2020-04-04 | Discharge: 2020-04-04 | Disposition: A | Discharge status: Home / Self Care | Payer: BC Managed Care – PPO | Source: Ambulatory Visit | Attending: Radiation Oncology | Admitting: Radiation Oncology

## 2020-04-04 VITALS — Temp 97.3°F

## 2020-04-04 VITALS — BP 135/81 | HR 70 | Temp 96.9°F | Wt 151.8 lb

## 2020-04-04 DIAGNOSIS — K115 Sialolithiasis: Secondary | ICD-10-CM

## 2020-04-04 DIAGNOSIS — C50212 Malignant neoplasm of upper-inner quadrant of left female breast: Secondary | ICD-10-CM | POA: Diagnosis not present

## 2020-04-04 DIAGNOSIS — Z170 Estrogen receptor positive status [ER+]: Secondary | ICD-10-CM | POA: Diagnosis not present

## 2020-04-04 NOTE — Consult Note (Signed)
NEW PATIENT EVALUATION  Name: Brittney Johnson  MRN: [id number]  Date:   04/04/2020     DOB: [birth year]   This 64 y.o. female patient presents to the clinic for initial evaluation of stage Ia (T1 cN0 M0) ER/PR positive invasive mammary carcinoma with micropapillary features status post wide local excision and sentinel node biopsy.  REFERRING PHYSICIAN: Tower, Wynelle Fanny, MD  CHIEF COMPLAINT:  Chief Complaint  Patient presents with  . Consult    DIAGNOSIS: There were no encounter diagnoses.   PREVIOUS INVESTIGATIONS:  Mammogram ultrasound reviewed Pathology report reviewed Clinical notes reviewed  HPI: Patient is a 64 year old female who presents with an abnormal mammogram of her left breast.  There was a mass at 10:00 6 cm from the nipple measuring 0.8 x 0.7 x 1 cm confirmed on ultrasound.  She underwent targeted ultrasound which was positive for invasive mammary carcinoma.  MR scan confirmed a 2 cm biopsy-proven mass in the 10 o'clock position no evidence of malignancy or adenopathy was noted.  She underwent a wide local excision showing 1.4 x 1.4 x 1 cm overall grade 1 invasive mammary carcinoma.  All margins were clear at 1.2 cm.  5 sentinel lymph nodes were examined all negative for malignancy.  She went under underwent Oncotype DX testing showing extremely low risk for recurrence with no benefit for chemotherapy.  Her tumor is ER/PR positive HER-2/neu not overexpressed.  She is now referred to radiation oncology for consideration of treatment.  She is doing well she states her breast is healing fine she specifically denies breast tenderness cough or bone pain.  PLANNED TREATMENT REGIMEN: Left whole breast radiation  PAST MEDICAL HISTORY:  has a past medical history of Atypical pneumonia (1998), DDD (degenerative disc disease), cervical, Family history of breast cancer, Family history of lung cancer, Hyperlipidemia, Hypertension, Low back pain, PONV (postoperative nausea and vomiting),  and Syncope and collapse (07/16/2007).    PAST SURGICAL HISTORY:  Past Surgical History:  Procedure Laterality Date  . ABDOMINAL HYSTERECTOMY     supracervical- L ovary remains  . BREAST BIOPSY Left 02/22/2020   Q clip [place] bx, [facility]  . CARDIOVASCULAR STRESS TEST     08/28/06 normal   . left hand surgery Left 2016   Dupuytrens  . LUMBAR DISC SURGERY  08/2017   [facility]  . OOPHORECTOMY Right    Right ovary removed   . PARTIAL MASTECTOMY WITH NEEDLE LOCALIZATION AND AXILLARY SENTINEL LYMPH NODE BX Left 03/15/2020   Procedure: PARTIAL MASTECTOMY WITH RF TAG PLACEMENT AND AXILLARY SENTINEL LYMPH NODE BX;  Surgeon: Herbert Pun, MD;  Location: [facility] ORS;  Service: General;  Laterality: Left;  . TUBAL LIGATION     ? 1979    FAMILY HISTORY: family history includes Breast cancer in her maternal aunt; Cancer in her cousin and cousin; Coronary artery disease in her maternal grandfather; Diabetes in her mother and another family member; Heart disease in an other family member; Hyperlipidemia in her mother; Hypertension in her mother; Lung cancer in her maternal uncle, paternal uncle, and another family member; Lung cancer (age of onset: 68) in her father; Stroke in her mother.  SOCIAL HISTORY:  reports that she quit smoking about 22 years ago. She has never used smokeless tobacco. She reports current alcohol use. She reports that she does not use drugs.  ALLERGIES: Codeine and Bee venom  MEDICATIONS:  Current Outpatient Medications  Medication Sig Dispense Refill  . amLODipine (NORVASC) 5 MG tablet Take 1 tablet (5  mg total) by mouth daily. 90 tablet 3  . aspirin 81 MG EC tablet Take 81 mg by mouth every Monday, Wednesday, and Friday.     Marland Kitchen atorvastatin (LIPITOR) 20 MG tablet Take 1 tablet (20 mg total) by mouth daily. 90 tablet 3  . Lifitegrast (XIIDRA) 5 % SOLN Place 1 drop into both eyes at bedtime.     Marland Kitchen lisinopril (ZESTRIL) 10 MG tablet Take 1 tablet (10 mg total) by mouth daily. 90  tablet 3  . Multiple Minerals-Vitamins (CALCIUM-MAGNESIUM-ZINC-D3) TABS Take 2 tablets by mouth daily.    . Multiple Vitamin (MULTIVITAMIN) tablet Take 1 tablet by mouth daily.    . Omega-3 Fatty Acids (FISH OIL) 1200 MG CAPS Take 1,200 mg by mouth 2 (two) times daily.     Marland Kitchen albuterol (VENTOLIN HFA) 108 (90 BASE) MCG/ACT inhaler Inhale 2 puffs into the lungs every 4 (four) hours as needed for wheezing. 2 puffs prior to exercise as needed. (Patient not taking: Reported on 03/01/2020) 1 Inhaler 3  . lidocaine-prilocaine (EMLA) cream Apply 1 application topically daily. (Patient not taking: Reported on 04/04/2020)     No current facility-administered medications for this encounter.    ECOG PERFORMANCE STATUS:  0 - Asymptomatic  REVIEW OF SYSTEMS: Patient denies any weight loss, fatigue, weakness, fever, chills or night sweats. Patient denies any loss of vision, blurred vision. Patient denies any ringing  of the ears or hearing loss. No irregular heartbeat. Patient denies heart murmur or history of fainting. Patient denies any chest pain or pain radiating to her upper extremities. Patient denies any shortness of breath, difficulty breathing at night, cough or hemoptysis. Patient denies any swelling in the lower legs. Patient denies any nausea vomiting, vomiting of blood, or coffee ground material in the vomitus. Patient denies any stomach pain. Patient states has had normal bowel movements no significant constipation or diarrhea. Patient denies any dysuria, hematuria or significant nocturia. Patient denies any problems walking, swelling in the joints or loss of balance. Patient denies any skin changes, loss of hair or loss of weight. Patient denies any excessive worrying or anxiety or significant depression. Patient denies any problems with insomnia. Patient denies excessive thirst, polyuria, polydipsia. Patient denies any swollen glands, patient denies easy bruising or easy bleeding. Patient denies any recent  infections, allergies or URI. Patient "s visual fields have not changed significantly in recent time.   PHYSICAL EXAM: BP 135/81 (BP Location: Right Arm, Patient Position: Sitting, Cuff Size: Normal)   Pulse 70   Temp (!) 96.9 F (36.1 C) (Tympanic)   Wt 151 lb 12.8 oz (68.9 kg)   LMP 07/30/2003   BMI 26.06 kg/m  She status post wide local excision of left breast which is healing well.  No dominant mass or nodularity is noted in either breast in 2 positions examined.  No axillary or supraclavicular adenopathy is noted.  Well-developed well-nourished patient in NAD. HEENT reveals PERLA, EOMI, discs not visualized.  Oral cavity is clear. No oral mucosal lesions are identified. Neck is clear without evidence of cervical or supraclavicular adenopathy. Lungs are clear to A&P. Cardiac examination is essentially unremarkable with regular rate and rhythm without murmur rub or thrill. Abdomen is benign with no organomegaly or masses noted. Motor sensory and DTR levels are equal and symmetric in the upper and lower extremities. Cranial nerves II through XII are grossly intact. Proprioception is intact. No peripheral adenopathy or edema is identified. No motor or sensory levels are noted. Crude visual fields  are within normal range.  LABORATORY DATA: Pathology report reviewed    RADIOLOGY RESULTS: Mammogram ultrasound and MRI scans reviewed compatible with above-stated findings   IMPRESSION: Stage Ia invasive mammary carcinoma the left breast status post wide local excision and sentinel node biopsy ER/PR positive with low benefit for systemic chemotherapy based on Oncotype DX.  Now for whole breast radiation  PLAN: At this time patient's breast is large making hypofractionated course of treatment difficult.  I would plan on delivering 5040 cGy in 28 fractions to her left breast boosting her scar another 1000 cGy in 5 fractions.  Risks and benefits of treatment including skin reaction fatigue alteration of  blood counts possible inclusion of superficial lung all were described in detail to the patient.  I personally set up and ordered CT simulation.  Patient and husband both seem to comprehend my treatment plan well.  I would like to take this opportunity to thank you for allowing me to participate in the care of your patient.Noreene Filbert, MD

## 2020-04-04 NOTE — Progress Notes (Signed)
HPI: Brittney Johnson is a 64 y.o. female who presents is referred by Dr. Glori Bickers for evaluation of right submandibular gland sialolithiasis.  Patient has had occasional swelling of her right submandibular gland.  She had seen her dentist several weeks ago with a nodule underneath the right side of her tongue that he felt represented a salivary stone and recommended drinking plenty of water and using sialagogues.  Patient felt like this expelled last week but wanted the area checked for any other stones.  She has had no swelling of the right semitubular gland recently.  She does complain of a dry mouth..  Past Medical History:  Diagnosis Date  . Atypical pneumonia 1998   not hosp  . DDD (degenerative disc disease), cervical   . Family history of breast cancer   . Family history of lung cancer   . Hyperlipidemia   . Hypertension   . Low back pain    on and off  . PONV (postoperative nausea and vomiting)    due to some sedations!  . Syncope and collapse 07/16/2007   Past Surgical History:  Procedure Laterality Date  . ABDOMINAL HYSTERECTOMY     supracervical- L ovary remains  . BREAST BIOPSY Left 02/22/2020   Q clip [place] bx, [facility]  . CARDIOVASCULAR STRESS TEST     08/28/06 normal   . left hand surgery Left 2016   Dupuytrens  . LUMBAR DISC SURGERY  08/2017   [facility]  . OOPHORECTOMY Right    Right ovary removed   . PARTIAL MASTECTOMY WITH NEEDLE LOCALIZATION AND AXILLARY SENTINEL LYMPH NODE BX Left 03/15/2020   Procedure: PARTIAL MASTECTOMY WITH RF TAG PLACEMENT AND AXILLARY SENTINEL LYMPH NODE BX;  Surgeon: Herbert Pun, MD;  Location: [facility] ORS;  Service: General;  Laterality: Left;  . TUBAL LIGATION     ? 1979   Social History   Socioeconomic History  . Marital status: Married    Spouse name: Not on file  . Number of children: Not on file  . Years of education: Not on file  . Highest education level: Not on file  Occupational History  . Occupation: Systems analyst: [employer]  Tobacco Use  . Smoking status: Former Smoker    Packs/day: 0.50    Years: 5.00    Pack years: 2.50    Quit date: 07/29/1997    Years since quitting: 22.6  . Smokeless tobacco: Never Used  Substance and Sexual Activity  . Alcohol use: Yes    Alcohol/week: 0.0 standard drinks    Comment: occ  . Drug use: No  . Sexual activity: Not on file  Other Topics Concern  . Not on file  Social History Narrative   Kids live close by, 3 grandkids- really enjoys, runs for exercise, ran a 5k March 2011; walks; quit 20 years; rare alcohol. Work for [employer]; lives in [place]. 2 boys- 1s.    Social Determinants of Health   Financial Resource Strain:   . Difficulty of Paying Living Expenses: Not on file  Food Insecurity:   . Worried About Charity fundraiser in the Last Year: Not on file  . Ran Out of Food in the Last Year: Not on file  Transportation Needs:   . Lack of Transportation (Medical): Not on file  . Lack of Transportation (Non-Medical): Not on file  Physical Activity:   . Days of Exercise per Week: Not on file  . Minutes of Exercise per Session: Not  on file  Stress:   . Feeling of Stress : Not on file  Social Connections:   . Frequency of Communication with Friends and Family: Not on file  . Frequency of Social Gatherings with Friends and Family: Not on file  . Attends Religious Services: Not on file  . Active Member of Clubs or Organizations: Not on file  . Attends Archivist Meetings: Not on file  . Marital Status: Not on file   Family History  Problem Relation Age of Onset  . Lung cancer Father 23  . Hyperlipidemia Mother   . Diabetes Mother   . Hypertension Mother   . Stroke Mother   . Heart disease Other   . Lung cancer Other        uncle  . Diabetes Other        aunt  . Coronary artery disease Maternal Grandfather   . Breast cancer Maternal Aunt        dx under 33  . Lung cancer Maternal Uncle   . Lung cancer  Paternal Uncle   . Cancer Cousin        unk types  . Cancer Cousin        unk types  . Thyroid disease Neg Hx    Allergies  Allergen Reactions  . Codeine     REACTION: passed out  . Bee Venom Swelling   Prior to Admission medications   Medication Sig Start Date End Date Taking? Authorizing Provider  albuterol (VENTOLIN HFA) 108 (90 BASE) MCG/ACT inhaler Inhale 2 puffs into the lungs every 4 (four) hours as needed for wheezing. 2 puffs prior to exercise as needed. 06/09/15  Yes Tower, Wynelle Fanny, MD  amLODipine (NORVASC) 5 MG tablet Take 1 tablet (5 mg total) by mouth daily. 04/19/19  Yes Tower, Wynelle Fanny, MD  aspirin 81 MG EC tablet Take 81 mg by mouth every Monday, Wednesday, and Friday.    Yes [provider]  atorvastatin (LIPITOR) 20 MG tablet Take 1 tablet (20 mg total) by mouth daily. 04/19/19  Yes Tower, Wynelle Fanny, MD  lidocaine-prilocaine (EMLA) cream Apply 1 application topically daily.  03/08/20  Yes [provider]  Lifitegrast Shirley Friar) 5 % SOLN Place 1 drop into both eyes at bedtime.  12/03/19  Yes [provider]  lisinopril (ZESTRIL) 10 MG tablet Take 1 tablet (10 mg total) by mouth daily. 04/19/19  Yes Tower, Wynelle Fanny, MD  Multiple Minerals-Vitamins (CALCIUM-MAGNESIUM-ZINC-D3) TABS Take 2 tablets by mouth daily.   Yes [provider]  Multiple Vitamin (MULTIVITAMIN) tablet Take 1 tablet by mouth daily.   Yes [provider]  Omega-3 Fatty Acids (FISH OIL) 1200 MG CAPS Take 1,200 mg by mouth 2 (two) times daily.    Yes [provider]     Positive ROS: Otherwise negative  All other systems have been reviewed and were otherwise negative with the exception of those mentioned in the HPI and as above.  Physical Exam: Constitutional: Alert, well-appearing, no acute distress Ears: External ears without lesions or tenderness. Ear canals are clear bilaterally with intact, clear TMs.  Nasal: External nose without lesions. Clear nasal  passages Oral: Lips and gums without lesions. Tongue and palate mucosa without lesions. Posterior oropharynx clear.  The floor mouth was clear.  Both submandibular ducts extrude clear saliva with no palpable stone along the distal duct to manual palpation.  Submandibular gland is not enlarged on palpation. Neck: No palpable adenopathy or masses Respiratory: Breathing comfortably  Skin: No facial/neck lesions or rash noted.  Procedures  Assessment: History of right submandibular sialolithiasis  Plan: No palpable stones in the office today with no evidence of sialoadenitis. She will follow-up if she has any recurrent stones or recurrent swelling of the right submandibular gland. Reassured her of normal examination in the office today.  Radene Journey, MD   CC:

## 2020-04-05 ENCOUNTER — Telehealth: Payer: Self-pay | Admitting: Internal Medicine

## 2020-04-05 NOTE — Telephone Encounter (Signed)
Spoke to patient regarding the results of the Oncotype; low risk; no chemotherapy planned.  Proceed with radiation.  Follow-up as planned.

## 2020-04-10 ENCOUNTER — Telehealth: Payer: Self-pay | Admitting: Licensed Clinical Social Worker

## 2020-04-10 ENCOUNTER — Encounter: Payer: Self-pay | Admitting: Licensed Clinical Social Worker

## 2020-04-10 ENCOUNTER — Ambulatory Visit: Payer: Self-pay | Admitting: Licensed Clinical Social Worker

## 2020-04-10 DIAGNOSIS — Z803 Family history of malignant neoplasm of breast: Secondary | ICD-10-CM

## 2020-04-10 DIAGNOSIS — Z1379 Encounter for other screening for genetic and chromosomal anomalies: Secondary | ICD-10-CM

## 2020-04-10 DIAGNOSIS — C50212 Malignant neoplasm of upper-inner quadrant of left female breast: Secondary | ICD-10-CM

## 2020-04-10 DIAGNOSIS — Z801 Family history of malignant neoplasm of trachea, bronchus and lung: Secondary | ICD-10-CM

## 2020-04-10 DIAGNOSIS — Z170 Estrogen receptor positive status [ER+]: Secondary | ICD-10-CM

## 2020-04-10 NOTE — Telephone Encounter (Signed)
Revealed negative genetic testing.  Revealed that a VUS in AIP and RET were identified. This normal result is reassuring and indicates that it is unlikely Brittney Johnson's cancer is due to a hereditary cause.  It is unlikely that there is an increased risk of another cancer due to a mutation in one of these genes.  However, genetic testing is not perfect, and cannot definitively rule out a hereditary cause.  It will be important for her to keep in contact with genetics to learn if any additional testing may be needed in the future.

## 2020-04-10 NOTE — Progress Notes (Signed)
HPI:  Brittney Johnson was previously seen in the [facility] due to a personal and family history of cancer and concerns regarding a hereditary predisposition to cancer. Please refer to our prior cancer genetics clinic note for more information regarding our discussion, assessment and recommendations, at the time. Brittney Johnson recent genetic test results were disclosed to her, as were recommendations warranted by these results. These results and recommendations are discussed in more detail below.  CANCER HISTORY:  Oncology History Overview Note  # LEFT BREAST CA- INVASIVE MAMMARY CARCINOMA, NO SPECIAL TYPE, WITH MICROPAPILLARY  FEATURES; ER/PR Positive [90%]; Her 2 NEG; G-1; 0.8 x 0.7 x 1 cm.   # # SURVIVORSHIP:   # GENETICS:   DIAGNOSIS:   STAGE:         ;  GOALS:  CURRENT/MOST RECENT THERAPY :     Carcinoma of upper-inner quadrant of left breast in female, estrogen receptor positive ([place])  03/01/2020 Initial Diagnosis   Carcinoma of upper-inner quadrant of left breast in female, estrogen receptor positive ([place])    Genetic Testing   Negative genetic testing. No pathogenic variants identified on the [facility] Multi-Cancer Panel. VUS in AIP called c.790_792del and VUS in RET called c.1163T>C identified. The report date is 04/07/2020.  The Multi-Cancer Panel offered by [facility] includes sequencing and/or deletion duplication testing of the following 85 genes: AIP, ALK, APC, ATM, AXIN2,BAP1,  BARD1, BLM, BMPR1A, BRCA1, BRCA2, BRIP1, CASR, CDC73, CDH1, CDK4, CDKN1B, CDKN1C, CDKN2A (p14ARF), CDKN2A (p16INK4a), CEBPA, CHEK2, CTNNA1, DICER1, DIS3L2, EGFR (c.2369C>T, p.Thr790Met variant only), EPCAM (Deletion/duplication testing only), FH, FLCN, GATA2, GPC3, GREM1 (Promoter region deletion/duplication testing only), HOXB13 (c.251G>A, p.Gly84Glu), HRAS, KIT, MAX, MEN1, MET, MITF (c.952G>A, p.Glu318Lys variant only), MLH1, MSH2, MSH3, MSH6, MUTYH, NBN, NF1, NF2, NTHL1, PALB2, PDGFRA,  PHOX2B, PMS2, POLD1, POLE, POT1, PRKAR1A, PTCH1, PTEN, RAD50, RAD51C, RAD51D, RB1, RECQL4, RET, RNF43, RUNX1, SDHAF2, SDHA (sequence changes only), SDHB, SDHC, SDHD, SMAD4, SMARCA4, SMARCB1, SMARCE1, STK11, SUFU, TERC, TERT, TMEM127, TP53, TSC1, TSC2, VHL, WRN and WT1.      FAMILY HISTORY:  We obtained a detailed, 4-generation family history.  Significant diagnoses are listed below: Family History  Problem Relation Age of Onset  . Lung cancer Father 2  . Hyperlipidemia Mother   . Diabetes Mother   . Hypertension Mother   . Stroke Mother   . Heart disease Other   . Lung cancer Other        uncle  . Diabetes Other        aunt  . Coronary artery disease Maternal Grandfather   . Breast cancer Maternal Aunt        dx under 43  . Lung cancer Maternal Uncle   . Lung cancer Paternal Uncle   . Cancer Cousin        unk types  . Cancer Cousin        unk types  . Thyroid disease Neg Hx    Brittney Johnson has 2 sons, ages 73 and 16. She has 1 full sister, 27, no history of cancer. Patient also has a maternal half brother, 85, and a paternal half-brother but she does not have information about him.   Brittney Johnson mother died at 5, no cancer history. Patient had 3 maternal aunts and 3 maternal uncles. One aunt had breast cancer under the age of 77. All three uncles had cancer, she believes lung cancer, and to her knowledge they did not smoke. Three maternal cousins had cancer but she is  unsure the types. Maternal grandmother passed at 25, grandfather passed in his 57s due to heart failure.   Brittney Johnson father died at 63 due to lung cancer. He had history of smoking, worked in a cigarette factory and also was in [employer]. Patient had 2 paternal uncles and 1 paternal aunt. Both uncles had lung cancer. A paternal cousin also had cancer, unsure type. Patient does not have information about paternal grandparents and in general has limited info about this side of the family.  Brittney Johnson is  unaware of previous family history of genetic testing for hereditary cancer risks. Patient's ancestors are of [place], [place], [place], [place] European descent. There is no reported Ashkenazi Jewish ancestry. There is no known consanguinity.  GENETIC TEST RESULTS: Genetic testing reported out on 04/07/2020 through the [facility] Multi- cancer panel found no pathogenic mutations.   The Multi-Cancer Panel offered by [facility] includes sequencing and/or deletion duplication testing of the following 85 genes: AIP, ALK, APC, ATM, AXIN2,BAP1,  BARD1, BLM, BMPR1A, BRCA1, BRCA2, BRIP1, CASR, CDC73, CDH1, CDK4, CDKN1B, CDKN1C, CDKN2A (p14ARF), CDKN2A (p16INK4a), CEBPA, CHEK2, CTNNA1, DICER1, DIS3L2, EGFR (c.2369C>T, p.Thr790Met variant only), EPCAM (Deletion/duplication testing only), FH, FLCN, GATA2, GPC3, GREM1 (Promoter region deletion/duplication testing only), HOXB13 (c.251G>A, p.Gly84Glu), HRAS, KIT, MAX, MEN1, MET, MITF (c.952G>A, p.Glu318Lys variant only), MLH1, MSH2, MSH3, MSH6, MUTYH, NBN, NF1, NF2, NTHL1, PALB2, PDGFRA, PHOX2B, PMS2, POLD1, POLE, POT1, PRKAR1A, PTCH1, PTEN, RAD50, RAD51C, RAD51D, RB1, RECQL4, RET, RNF43, RUNX1, SDHAF2, SDHA (sequence changes only), SDHB, SDHC, SDHD, SMAD4, SMARCA4, SMARCB1, SMARCE1, STK11, SUFU, TERC, TERT, TMEM127, TP53, TSC1, TSC2, VHL, WRN and WT1.   The test report has been scanned into EPIC and is located under the Molecular Pathology section of the Results Review tab.  A portion of the result report is included below for reference.     We discussed with Brittney Johnson that because current genetic testing is not perfect, it is possible there may be a gene mutation in one of these genes that current testing cannot detect, but that chance is small.  We also discussed, that there could be another gene that has not yet been discovered, or that we have not yet tested, that is responsible for the cancer diagnoses in the family. It is also possible there is a hereditary cause  for the cancer in the family that Brittney Johnson did not inherit and therefore was not identified in her testing.  Therefore, it is important to remain in touch with cancer genetics in the future so that we can continue to offer Brittney Johnson the most up to date genetic testing.   Genetic testing did identify 2 Variants of uncertain significance (VUS) - one in the AIP gene called c.790_792del, a second in the RET gene called c.1163T>C.  At this time, it is unknown if these variants are associated with increased cancer risk or if they are normal findings, but most variants such as these get reclassified to being inconsequential. They should not be used to make medical management decisions. With time, we suspect the lab will determine the significance of these variants, if any. If we do learn more about them, we will try to contact Brittney Johnson to discuss it further. However, it is important to stay in touch with [place] periodically and keep the address and phone number up to date.  ADDITIONAL GENETIC TESTING: We discussed with Brittney Johnson that her genetic testing was fairly extensive.  If there are genes identified to increase cancer risk that can be analyzed  in the future, we would be happy to discuss and coordinate this testing at that time.    CANCER SCREENING RECOMMENDATIONS: Brittney Johnson test result is considered negative (normal).  This means that we have not identified a hereditary cause for her  personal and family history of cancer at this time. Most cancers happen by chance and this negative test suggests that her cancer may fall into this category.    While reassuring, this does not definitively rule out a hereditary predisposition to cancer. It is still possible that there could be genetic mutations that are undetectable by current technology. There could be genetic mutations in genes that have not been tested or identified to increase cancer risk.  Therefore, it is recommended she continue to follow the  cancer management and screening guidelines provided by her oncology and primary healthcare provider.   An individual's cancer risk and medical management are not determined by genetic test results alone. Overall cancer risk assessment incorporates additional factors, including personal medical history, family history, and any available genetic information that may result in a personalized plan for cancer prevention and surveillance.  RECOMMENDATIONS FOR FAMILY MEMBERS:  Relatives in this family might be at some increased risk of developing cancer, over the general population risk, simply due to the family history of cancer.  We recommended female relatives in this family have a yearly mammogram beginning at age 66, or 22 years younger than the earliest onset of cancer, an annual clinical breast exam, and perform monthly breast self-exams. Female relatives in this family should also have a gynecological exam as recommended by their primary provider.  All family members should be referred for colonoscopy starting at age 41.   FOLLOW-UP: Lastly, we discussed with Brittney Johnson that cancer genetics is a rapidly advancing field and it is possible that new genetic tests will be appropriate for her and/or her family members in the future. We encouraged her to remain in contact with cancer genetics on an annual basis so we can update her personal and family histories and let her know of advances in cancer genetics that may benefit this family.   Our contact number was provided. Brittney Johnson questions were answered to her satisfaction, and she knows she is welcome to call us at anytime with additional questions or concerns.   Faith Rogue, MS, [facility] Genetic Counselor [email or web address] Phone: [phone]

## 2020-04-11 ENCOUNTER — Encounter: Payer: Self-pay | Admitting: Dermatology

## 2020-04-11 ENCOUNTER — Ambulatory Visit (INDEPENDENT_AMBULATORY_CARE_PROVIDER_SITE_OTHER): Payer: BC Managed Care – PPO | Admitting: Dermatology

## 2020-04-11 ENCOUNTER — Ambulatory Visit
Admission: RE | Admit: 2020-04-11 | Discharge: 2020-04-11 | Disposition: A | Discharge status: Home / Self Care | Payer: BC Managed Care – PPO | Source: Ambulatory Visit | Attending: Radiation Oncology | Admitting: Radiation Oncology

## 2020-04-11 ENCOUNTER — Other Ambulatory Visit: Payer: Self-pay

## 2020-04-11 DIAGNOSIS — C50212 Malignant neoplasm of upper-inner quadrant of left female breast: Secondary | ICD-10-CM | POA: Diagnosis not present

## 2020-04-11 DIAGNOSIS — Z853 Personal history of malignant neoplasm of breast: Secondary | ICD-10-CM | POA: Diagnosis not present

## 2020-04-11 DIAGNOSIS — L570 Actinic keratosis: Secondary | ICD-10-CM

## 2020-04-11 DIAGNOSIS — Z510 Encounter for antineoplastic radiation therapy: Secondary | ICD-10-CM | POA: Insufficient documentation

## 2020-04-11 DIAGNOSIS — Z1283 Encounter for screening for malignant neoplasm of skin: Secondary | ICD-10-CM

## 2020-04-11 DIAGNOSIS — L814 Other melanin hyperpigmentation: Secondary | ICD-10-CM

## 2020-04-11 DIAGNOSIS — L821 Other seborrheic keratosis: Secondary | ICD-10-CM

## 2020-04-11 DIAGNOSIS — L820 Inflamed seborrheic keratosis: Secondary | ICD-10-CM | POA: Diagnosis not present

## 2020-04-11 DIAGNOSIS — Z170 Estrogen receptor positive status [ER+]: Secondary | ICD-10-CM | POA: Diagnosis not present

## 2020-04-11 DIAGNOSIS — D229 Melanocytic nevi, unspecified: Secondary | ICD-10-CM

## 2020-04-11 DIAGNOSIS — D1800 Hemangioma unspecified site: Secondary | ICD-10-CM

## 2020-04-11 DIAGNOSIS — L719 Rosacea, unspecified: Secondary | ICD-10-CM | POA: Diagnosis not present

## 2020-04-11 DIAGNOSIS — L738 Other specified follicular disorders: Secondary | ICD-10-CM

## 2020-04-11 DIAGNOSIS — L578 Other skin changes due to chronic exposure to nonionizing radiation: Secondary | ICD-10-CM

## 2020-04-11 NOTE — Progress Notes (Signed)
Follow-Up Visit   Subjective  Brittney Johnson is a 64 y.o. female who presents for the following: Annual Exam (History of AKs - TBSE today) and Other (Spots of legs and face that get itchy and irritated.). She has recent diagnosis of breast cancer status post surgery and awaiting radiation. The patient presents for Total-Body Skin Exam (TBSE) for skin cancer screening and mole check.  The following portions of the chart were reviewed this encounter and updated as appropriate:  Tobacco  Allergies  Meds  Problems  Med Hx  Surg Hx  Fam Hx     Review of Systems:  No other skin or systemic complaints except as noted in HPI or Assessment and Plan.  Objective  Well appearing patient in no apparent distress; mood and affect are within normal limits.  A full examination was performed including scalp, head, eyes, ears, nose, lips, neck, chest, axillae, abdomen, back, buttocks, bilateral upper extremities, bilateral lower extremities, hands, feet, fingers, toes, fingernails, and toenails. All findings within normal limits unless otherwise noted below.  Objective  Left antecubital, L leg (5): Erythematous keratotic or waxy stuck-on papule or plaque.   Objective  Nasal bridge, chest (3): Erythematous thin papules/macules with gritty scale.   Objective  Head - Anterior (Face): Mid face erythema with telangiectasias +/- scattered inflammatory papules.   Objective  Head - Anterior (Face): Small yellow papules with a central dell.   Objective  Left Breast: Well healed scar    Assessment & Plan  Inflamed seborrheic keratosis (5) Left antecubital, L leg  Destruction of lesion - Left antecubital, L leg Complexity: simple   Destruction method: cryotherapy   Informed consent: discussed and consent obtained   Timeout:  patient name, date of birth, surgical site, and procedure verified Lesion destroyed using liquid nitrogen: Yes   Region frozen until ice ball extended beyond  lesion: Yes   Outcome: patient tolerated procedure well with no complications   Post-procedure details: wound care instructions given    AK (actinic keratosis) (3) Nasal bridge, chest  Destruction of lesion - Nasal bridge, chest Complexity: simple   Destruction method: cryotherapy   Informed consent: discussed and consent obtained   Timeout:  patient name, date of birth, surgical site, and procedure verified Lesion destroyed using liquid nitrogen: Yes   Region frozen until ice ball extended beyond lesion: Yes   Outcome: patient tolerated procedure well with no complications   Post-procedure details: wound care instructions given    Rosacea Head - Anterior (Face)  The patient will observe  No treatment   Sebaceous hyperplasia Head - Anterior (Face)  Benign-appearing.  Observation.  Call clinic for new or changing lesions.  Recommend daily use of broad spectrum spf 30+ sunscreen to sun-exposed areas.    Hx of breast cancer Left Breast  Continue Radiation as scheduled next week   Skin cancer screening   Lentigines - Scattered tan macules - Discussed due to sun exposure - Benign, observe - Call for any changes  Seborrheic Keratoses - Stuck-on, waxy, tan-brown papules and plaques  - Discussed benign etiology and prognosis. - Observe - Call for any changes  Melanocytic Nevi - Tan-brown and/or pink-flesh-colored symmetric macules and papules - Benign appearing on exam today - Observation - Call clinic for new or changing moles - Recommend daily use of broad spectrum spf 30+ sunscreen to sun-exposed areas.   Hemangiomas - Red papules - Discussed benign nature - Observe - Call for any changes  Actinic Damage - diffuse  scaly erythematous macules with underlying dyspigmentation - Recommend daily broad spectrum sunscreen SPF 30+ to sun-exposed areas, reapply every 2 hours as needed.  - Call for new or changing lesions.  Skin cancer screening performed  today.  Return in about 1 year (around 04/11/2021) for TBSE.  IMarye Round, CMA, am acting as scribe for Sarina Ser, MD .  Documentation: I have reviewed the above documentation for accuracy and completeness, and I agree with the above.  Sarina Ser, MD

## 2020-04-11 NOTE — Patient Instructions (Signed)

## 2020-04-12 DIAGNOSIS — C50212 Malignant neoplasm of upper-inner quadrant of left female breast: Secondary | ICD-10-CM | POA: Diagnosis not present

## 2020-04-12 DIAGNOSIS — Z510 Encounter for antineoplastic radiation therapy: Secondary | ICD-10-CM | POA: Diagnosis not present

## 2020-04-12 DIAGNOSIS — Z170 Estrogen receptor positive status [ER+]: Secondary | ICD-10-CM | POA: Diagnosis not present

## 2020-04-13 ENCOUNTER — Telehealth: Payer: Self-pay | Admitting: Family Medicine

## 2020-04-13 ENCOUNTER — Inpatient Hospital Stay (HOSPITAL_BASED_OUTPATIENT_CLINIC_OR_DEPARTMENT_OTHER): Payer: BC Managed Care – PPO | Admitting: Internal Medicine

## 2020-04-13 ENCOUNTER — Other Ambulatory Visit: Payer: Self-pay

## 2020-04-13 DIAGNOSIS — Z90710 Acquired absence of both cervix and uterus: Secondary | ICD-10-CM | POA: Diagnosis not present

## 2020-04-13 DIAGNOSIS — C50212 Malignant neoplasm of upper-inner quadrant of left female breast: Secondary | ICD-10-CM

## 2020-04-13 DIAGNOSIS — Z801 Family history of malignant neoplasm of trachea, bronchus and lung: Secondary | ICD-10-CM | POA: Diagnosis not present

## 2020-04-13 DIAGNOSIS — Z833 Family history of diabetes mellitus: Secondary | ICD-10-CM | POA: Diagnosis not present

## 2020-04-13 DIAGNOSIS — Z79899 Other long term (current) drug therapy: Secondary | ICD-10-CM | POA: Diagnosis not present

## 2020-04-13 DIAGNOSIS — Z87891 Personal history of nicotine dependence: Secondary | ICD-10-CM | POA: Diagnosis not present

## 2020-04-13 DIAGNOSIS — I10 Essential (primary) hypertension: Secondary | ICD-10-CM

## 2020-04-13 DIAGNOSIS — Z803 Family history of malignant neoplasm of breast: Secondary | ICD-10-CM | POA: Diagnosis not present

## 2020-04-13 DIAGNOSIS — Z170 Estrogen receptor positive status [ER+]: Secondary | ICD-10-CM | POA: Diagnosis not present

## 2020-04-13 DIAGNOSIS — Z8249 Family history of ischemic heart disease and other diseases of the circulatory system: Secondary | ICD-10-CM | POA: Diagnosis not present

## 2020-04-13 DIAGNOSIS — E785 Hyperlipidemia, unspecified: Secondary | ICD-10-CM | POA: Diagnosis not present

## 2020-04-13 DIAGNOSIS — E559 Vitamin D deficiency, unspecified: Secondary | ICD-10-CM

## 2020-04-13 DIAGNOSIS — Z90721 Acquired absence of ovaries, unilateral: Secondary | ICD-10-CM | POA: Diagnosis not present

## 2020-04-13 DIAGNOSIS — Z9079 Acquired absence of other genital organ(s): Secondary | ICD-10-CM | POA: Diagnosis not present

## 2020-04-13 DIAGNOSIS — E7800 Pure hypercholesterolemia, unspecified: Secondary | ICD-10-CM

## 2020-04-13 DIAGNOSIS — Z7982 Long term (current) use of aspirin: Secondary | ICD-10-CM | POA: Diagnosis not present

## 2020-04-13 NOTE — Progress Notes (Signed)
[facility] [place] NOTE  Patient Care Team: Tower, Wynelle Fanny, MD as PCP - General  CHIEF COMPLAINTS/PURPOSE OF CONSULTATION: Breast cancer  #  Oncology History Overview Note  # LEFT BREAST CA- INVASIVE MAMMARY CARCINOMA, NO SPECIAL TYPE, WITH MICROPAPILLARY  FEATURES; ER/PR Positive [90%]; Her 2 NEG; G-1; 0.8 x 0.7 x 1 cm.  Oncotype recurrence score= 0; 3% distant recurrence; no chemotherapy; adjuvant radiation  # # SURVIVORSHIP:   # GENETICS: No pathogenic variants; VUS*  DIAGNOSIS: Cure  STAGE: 1        ;  GOALS: Cure  CURRENT/MOST RECENT THERAPY :     Carcinoma of upper-inner quadrant of left breast in female, estrogen receptor positive ([place])  03/01/2020 Initial Diagnosis   Carcinoma of upper-inner quadrant of left breast in female, estrogen receptor positive ([place])    Genetic Testing   Negative genetic testing. No pathogenic variants identified on the [facility] Multi-Cancer Panel. VUS in AIP called c.790_792del and VUS in RET called c.1163T>C identified. The report date is 04/07/2020.  The Multi-Cancer Panel offered by [facility] includes sequencing and/or deletion duplication testing of the following 85 genes: AIP, ALK, APC, ATM, AXIN2,BAP1,  BARD1, BLM, BMPR1A, BRCA1, BRCA2, BRIP1, CASR, CDC73, CDH1, CDK4, CDKN1B, CDKN1C, CDKN2A (p14ARF), CDKN2A (p16INK4a), CEBPA, CHEK2, CTNNA1, DICER1, DIS3L2, EGFR (c.2369C>T, p.Thr790Met variant only), EPCAM (Deletion/duplication testing only), FH, FLCN, GATA2, GPC3, GREM1 (Promoter region deletion/duplication testing only), HOXB13 (c.251G>A, p.Gly84Glu), HRAS, KIT, MAX, MEN1, MET, MITF (c.952G>A, p.Glu318Lys variant only), MLH1, MSH2, MSH3, MSH6, MUTYH, NBN, NF1, NF2, NTHL1, PALB2, PDGFRA, PHOX2B, PMS2, POLD1, POLE, POT1, PRKAR1A, PTCH1, PTEN, RAD50, RAD51C, RAD51D, RB1, RECQL4, RET, RNF43, RUNX1, SDHAF2, SDHA (sequence changes only), SDHB, SDHC, SDHD, SMAD4, SMARCA4, SMARCB1, SMARCE1, STK11, SUFU, TERC, TERT, TMEM127, TP53, TSC1, TSC2,  VHL, WRN and WT1.       HISTORY OF PRESENTING ILLNESS:  Brittney Johnson 64 y.o.  female diagnosis of stage I ER/PR positive HER-2 negative breast cancer is here for follow-up to review the results of the Oncotype.  In the interim patient underwent evaluation with radiation oncology.  Otherwise healing well from surgery.  No new complaints.   Review of Systems  Constitutional: Negative for chills, diaphoresis, fever, malaise/fatigue and weight loss.  HENT: Negative for nosebleeds and sore throat.   Eyes: Negative for double vision.  Respiratory: Negative for cough, hemoptysis, sputum production, shortness of breath and wheezing.   Cardiovascular: Negative for chest pain, palpitations, orthopnea and leg swelling.  Gastrointestinal: Negative for abdominal pain, blood in stool, constipation, diarrhea, heartburn, melena, nausea and vomiting.  Genitourinary: Negative for dysuria, frequency and urgency.  Musculoskeletal: Negative for back pain and joint pain.  Skin: Negative.  Negative for itching and rash.  Neurological: Negative for dizziness, tingling, focal weakness, weakness and headaches.  Endo/Heme/Allergies: Does not bruise/bleed easily.  Psychiatric/Behavioral: Negative for depression. The patient is not nervous/anxious and does not have insomnia.      MEDICAL HISTORY:  Past Medical History:  Diagnosis Date  . Atypical pneumonia 1998   not hosp  . DDD (degenerative disc disease), cervical   . Family history of breast cancer   . Family history of lung cancer   . Hyperlipidemia   . Hypertension   . Low back pain    on and off  . PONV (postoperative nausea and vomiting)    due to some sedations!  . Syncope and collapse 07/16/2007    SURGICAL HISTORY: Past Surgical History:  Procedure Laterality Date  . ABDOMINAL HYSTERECTOMY  supracervical- L ovary remains  . BREAST BIOPSY Left 02/22/2020   Q clip [place] bx, [facility]  . CARDIOVASCULAR STRESS TEST     08/28/06 normal   .  left hand surgery Left 2016   Dupuytrens  . LUMBAR DISC SURGERY  08/2017   [facility]  . OOPHORECTOMY Right    Right ovary removed   . PARTIAL MASTECTOMY WITH NEEDLE LOCALIZATION AND AXILLARY SENTINEL LYMPH NODE BX Left 03/15/2020   Procedure: PARTIAL MASTECTOMY WITH RF TAG PLACEMENT AND AXILLARY SENTINEL LYMPH NODE BX;  Surgeon: Herbert Pun, MD;  Location: [facility] ORS;  Service: General;  Laterality: Left;  . TUBAL LIGATION     ? 1979    SOCIAL HISTORY: Social History   Socioeconomic History  . Marital status: Married    Spouse name: Not on file  . Number of children: Not on file  . Years of education: Not on file  . Highest education level: Not on file  Occupational History  . Occupation: Adult nurse: [employer]  Tobacco Use  . Smoking status: Former Smoker    Packs/day: 0.50    Years: 5.00    Pack years: 2.50    Quit date: 07/29/1997    Years since quitting: 22.7  . Smokeless tobacco: Never Used  Substance and Sexual Activity  . Alcohol use: Yes    Alcohol/week: 0.0 standard drinks    Comment: occ  . Drug use: No  . Sexual activity: Not on file  Other Topics Concern  . Not on file  Social History Narrative   Kids live close by, 3 grandkids- really enjoys, runs for exercise, ran a 5k March 2011; walks; quit 20 years; rare alcohol. Work for [employer]; lives in [place]. 2 boys- 65s.    Social Determinants of Health   Financial Resource Strain:   . Difficulty of Paying Living Expenses: Not on file  Food Insecurity:   . Worried About Charity fundraiser in the Last Year: Not on file  . Ran Out of Food in the Last Year: Not on file  Transportation Needs:   . Lack of Transportation (Medical): Not on file  . Lack of Transportation (Non-Medical): Not on file  Physical Activity:   . Days of Exercise per Week: Not on file  . Minutes of Exercise per Session: Not on file  Stress:   . Feeling of Stress : Not on file  Social Connections:    . Frequency of Communication with Friends and Family: Not on file  . Frequency of Social Gatherings with Friends and Family: Not on file  . Attends Religious Services: Not on file  . Active Member of Clubs or Organizations: Not on file  . Attends Archivist Meetings: Not on file  . Marital Status: Not on file  Intimate Partner Violence:   . Fear of Current or Ex-Partner: Not on file  . Emotionally Abused: Not on file  . Physically Abused: Not on file  . Sexually Abused: Not on file    FAMILY HISTORY: Family History  Problem Relation Age of Onset  . Lung cancer Father 57  . Hyperlipidemia Mother   . Diabetes Mother   . Hypertension Mother   . Stroke Mother   . Heart disease Other   . Lung cancer Other        uncle  . Diabetes Other        aunt  . Coronary artery disease Maternal Grandfather   . Breast cancer Maternal  Aunt        dx under 72  . Lung cancer Maternal Uncle   . Lung cancer Paternal Uncle   . Cancer Cousin        unk types  . Cancer Cousin        unk types  . Thyroid disease Neg Hx     ALLERGIES:  is allergic to codeine and bee venom.  MEDICATIONS:  Current Outpatient Medications  Medication Sig Dispense Refill  . albuterol (VENTOLIN HFA) 108 (90 BASE) MCG/ACT inhaler Inhale 2 puffs into the lungs every 4 (four) hours as needed for wheezing. 2 puffs prior to exercise as needed. 1 Inhaler 3  . amLODipine (NORVASC) 5 MG tablet Take 1 tablet (5 mg total) by mouth daily. 90 tablet 3  . aspirin 81 MG EC tablet Take 81 mg by mouth every Monday, Wednesday, and Friday.     Marland Kitchen atorvastatin (LIPITOR) 20 MG tablet Take 1 tablet (20 mg total) by mouth daily. 90 tablet 3  . lidocaine-prilocaine (EMLA) cream Apply 1 application topically daily.     Marland Kitchen Lifitegrast (XIIDRA) 5 % SOLN Place 1 drop into both eyes at bedtime.     Marland Kitchen lisinopril (ZESTRIL) 10 MG tablet Take 1 tablet (10 mg total) by mouth daily. 90 tablet 3  . Multiple Minerals-Vitamins  (CALCIUM-MAGNESIUM-ZINC-D3) TABS Take 2 tablets by mouth daily.    . Multiple Vitamin (MULTIVITAMIN) tablet Take 1 tablet by mouth daily.    . Omega-3 Fatty Acids (FISH OIL) 1200 MG CAPS Take 1,200 mg by mouth 2 (two) times daily.      No current facility-administered medications for this visit.      Marland Kitchen  PHYSICAL EXAMINATION: ECOG PERFORMANCE STATUS: 0 - Asymptomatic  Vitals:   04/13/20 1040  BP: 138/68  Pulse: 75  Resp: 16  Temp: 98.8 F (37.1 C)  SpO2: 100%   Filed Weights   04/13/20 1040  Weight: 153 lb (69.4 kg)    Physical Exam HENT:     Head: Normocephalic and atraumatic.     Mouth/Throat:     Pharynx: No oropharyngeal exudate.  Eyes:     Pupils: Pupils are equal, round, and reactive to light.  Cardiovascular:     Rate and Rhythm: Normal rate and regular rhythm.  Pulmonary:     Effort: Pulmonary effort is normal. No respiratory distress.     Breath sounds: Normal breath sounds. No wheezing.  Abdominal:     General: Bowel sounds are normal. There is no distension.     Palpations: Abdomen is soft. There is no mass.     Tenderness: There is no abdominal tenderness. There is no guarding or rebound.  Musculoskeletal:        General: No tenderness. Normal range of motion.     Cervical back: Normal range of motion and neck supple.  Skin:    General: Skin is warm.  Neurological:     Mental Status: She is alert and oriented to person, place, and time.  Psychiatric:        Mood and Affect: Affect normal.    LABORATORY DATA:  I have reviewed the data as listed Lab Results  Component Value Date   WBC 4.6 03/13/2020   HGB 12.6 03/13/2020   HCT 36.5 03/13/2020   MCV 86.7 03/13/2020   PLT 230 03/13/2020   Recent Labs    04/16/19 0930 03/13/20 0918  NA 138 140  K 4.4 3.9  CL 101 103  CO2 30  27  GLUCOSE 88 91  BUN 14 13  CREATININE 0.71 0.59  CALCIUM 9.9 9.6  GFRNONAA  --  >60  GFRAA  --  >60  PROT 7.1  --   ALBUMIN 4.5  --   AST 15  --   ALT 18   --   ALKPHOS 48  --   BILITOT 0.4  --     RADIOGRAPHIC STUDIES: I have personally reviewed the radiological images as listed and agreed with the findings in the report. NM SENTINEL NODE INJECTION  Result Date: 03/15/2020 CLINICAL DATA:  Left breast cancer. EXAM: NUCLEAR MEDICINE BREAST LYMPHOSCINTIGRAPHY TECHNIQUE: Intradermal injection of radiopharmaceutical was performed at the 12 o'clock, 3 o'clock, 6 o'clock, and 9 o'clock positions around the left nipple. The patient was then sent to the operating room where the sentinel node(s) were identified and removed by the surgeon. RADIOPHARMACEUTICALS:  Total of 1 mCi Millipore-filtered Technetium-67msulfur colloid, injected in four aliquots of 0.25 mCi each. IMPRESSION: Uncomplicated intradermal injection of a total of 1 mCi Technetium-917mulfur colloid for purposes of sentinel node identification. Electronically Signed   By: JaCorrie Mckusick.O.   On: 03/15/2020 12:02   MM Breast Surgical Specimen  Result Date: 03/15/2020 CLINICAL DATA:  Status post RF localized left breast lumpectomy. EXAM: SPECIMEN RADIOGRAPH OF THE  BREAST COMPARISON:  Previous exam(s). FINDINGS: Status post excision of the left breast. The RF reflector and Q shaped clip are present within the specimen. IMPRESSION: Specimen radiograph of the left breast. Electronically Signed   By: DiLillia Mountain.D.   On: 03/15/2020 11:52    ASSESSMENT & PLAN:   Carcinoma of upper-inner quadrant of left breast in female, estrogen receptor positive ([place]#Left breast cancer stage I ER/PR positive HER-2 negative; s/p lumpectomy; Oncotype recurrence score 0; 3% distant recurrence at 9 years.  No benefit from adjuvant chemotherapy.  We will plan proceeding with radiation.  Patient met with radiation plan to start radiation next week.  #Post radiation would recommend starting patient on antihormone therapy-5 years. Discussed the mechanism of action of aromatase inhibitors-with blocking of estrogen to  prevent breast cancer.  Also discussed the potential side effects including but not limited to arthralgias hot flashes and increased risk of osteoporosis.  Discussed regarding treatment of hot flashes/osteoporosis prevention in general.  #BMD OCT 2020- T score+ -1.4-osteopenia.  Discussed the potential risk factors for osteoporosis- age/gender/postmenopausal status/ use of anti-estrogen treatments. Discussed multiple options including exercise/ calcium and vitamin D supplementation/ and also use of bisphosphonates. Discussed oral bisphosphonates versus parenteral bisphosphonate like Reclast/ RANK ligand inhibitors- desnosumab. Discussed the potential benefits and/side effects.  We will discuss further.  #Genetic counseling-no pathogenic variants noted. VUS-noted discussed.  # DISPOSITION:   #Follow-up in 2 months-MD no labs.  Dr. B.Jacinto Reap    GoCammie SickleMD 04/13/2020 12:43 PM

## 2020-04-13 NOTE — Assessment & Plan Note (Addendum)
#  Left breast cancer stage I ER/PR positive HER-2 negative; s/p lumpectomy; Oncotype recurrence score 0; 3% distant recurrence at 9 years.  No benefit from adjuvant chemotherapy.  We will plan proceeding with radiation.  Patient met with radiation plan to start radiation next week.  #Post radiation would recommend starting patient on antihormone therapy-5 years. Discussed the mechanism of action of aromatase inhibitors-with blocking of estrogen to prevent breast cancer.  Also discussed the potential side effects including but not limited to arthralgias hot flashes and increased risk of osteoporosis.  Discussed regarding treatment of hot flashes/osteoporosis prevention in general.  #BMD OCT 2020- T score+ -1.4-osteopenia.  Discussed the potential risk factors for osteoporosis- age/gender/postmenopausal status/ use of anti-estrogen treatments. Discussed multiple options including exercise/ calcium and vitamin D supplementation/ and also use of bisphosphonates. Discussed oral bisphosphonates versus parenteral bisphosphonate like Reclast/ RANK ligand inhibitors- desnosumab. Discussed the potential benefits and/side effects.  We will discuss further.  #Genetic counseling-no pathogenic variants noted. VUS-noted discussed.  # DISPOSITION:   #Follow-up in 2 months-MD no labs.  Dr. Jacinto Reap..

## 2020-04-13 NOTE — Telephone Encounter (Signed)
-----   Message from Cloyd Stagers, RT sent at 03/28/2020 10:56 AM EDT ----- Regarding: Lab Orders for Friday 9.17.2021 Please place lab orders for  Friday 9.17.2021, office visit for physical on Wednesday 9.22.2021 Thank you, Dyke Maes RT(R)

## 2020-04-14 ENCOUNTER — Other Ambulatory Visit: Payer: Self-pay

## 2020-04-14 ENCOUNTER — Other Ambulatory Visit (INDEPENDENT_AMBULATORY_CARE_PROVIDER_SITE_OTHER): Payer: BC Managed Care – PPO

## 2020-04-14 ENCOUNTER — Other Ambulatory Visit: Payer: Self-pay | Admitting: *Deleted

## 2020-04-14 DIAGNOSIS — E559 Vitamin D deficiency, unspecified: Secondary | ICD-10-CM

## 2020-04-14 DIAGNOSIS — I10 Essential (primary) hypertension: Secondary | ICD-10-CM | POA: Diagnosis not present

## 2020-04-14 DIAGNOSIS — Z170 Estrogen receptor positive status [ER+]: Secondary | ICD-10-CM

## 2020-04-14 DIAGNOSIS — E7800 Pure hypercholesterolemia, unspecified: Secondary | ICD-10-CM | POA: Diagnosis not present

## 2020-04-14 DIAGNOSIS — C50212 Malignant neoplasm of upper-inner quadrant of left female breast: Secondary | ICD-10-CM

## 2020-04-14 LAB — CBC WITH DIFFERENTIAL/PLATELET
Basophils Absolute: 0 10*3/uL (ref 0.0–0.1)
Basophils Relative: 0.6 % (ref 0.0–3.0)
Eosinophils Absolute: 0.1 10*3/uL (ref 0.0–0.7)
Eosinophils Relative: 2.8 % (ref 0.0–5.0)
HCT: 36 % (ref 36.0–46.0)
Hemoglobin: 11.9 g/dL — ABNORMAL LOW (ref 12.0–15.0)
Lymphocytes Relative: 37.2 % (ref 12.0–46.0)
Lymphs Abs: 1.6 10*3/uL (ref 0.7–4.0)
MCHC: 33.1 g/dL (ref 30.0–36.0)
MCV: 88.7 fl (ref 78.0–100.0)
Monocytes Absolute: 0.5 10*3/uL (ref 0.1–1.0)
Monocytes Relative: 10.7 % (ref 3.0–12.0)
Neutro Abs: 2.1 10*3/uL (ref 1.4–7.7)
Neutrophils Relative %: 48.7 % (ref 43.0–77.0)
Platelets: 199 10*3/uL (ref 150.0–400.0)
RBC: 4.06 Mil/uL (ref 3.87–5.11)
RDW: 13.6 % (ref 11.5–15.5)
WBC: 4.4 10*3/uL (ref 4.0–10.5)

## 2020-04-14 LAB — COMPREHENSIVE METABOLIC PANEL
ALT: 17 U/L (ref 0–35)
AST: 17 U/L (ref 0–37)
Albumin: 4.4 g/dL (ref 3.5–5.2)
Alkaline Phosphatase: 52 U/L (ref 39–117)
BUN: 13 mg/dL (ref 6–23)
CO2: 30 mEq/L (ref 19–32)
Calcium: 9.9 mg/dL (ref 8.4–10.5)
Chloride: 104 mEq/L (ref 96–112)
Creatinine, Ser: 0.73 mg/dL (ref 0.40–1.20)
GFR: 79.64 mL/min (ref >60.00)
Glucose, Bld: 92 mg/dL (ref 70–99)
Potassium: 4.7 mEq/L (ref 3.5–5.1)
Sodium: 139 mEq/L (ref 135–145)
Total Bilirubin: 0.5 mg/dL (ref 0.2–1.2)
Total Protein: 7.1 g/dL (ref 6.0–8.3)

## 2020-04-14 LAB — VITAMIN D 25 HYDROXY (VIT D DEFICIENCY, FRACTURES): VITD: 32.92 ng/mL (ref 30.00–100.00)

## 2020-04-14 LAB — TSH: TSH: 1.67 u[IU]/mL (ref 0.35–4.50)

## 2020-04-14 LAB — LIPID PANEL
Cholesterol: 199 mg/dL (ref 0–200)
HDL: 66.4 mg/dL (ref >39.00)
LDL Cholesterol: 111 mg/dL — ABNORMAL HIGH (ref 0–99)
NonHDL: 132.36
Total CHOL/HDL Ratio: 3
Triglycerides: 109 mg/dL (ref 0.0–149.0)
VLDL: 21.8 mg/dL (ref 0.0–40.0)

## 2020-04-18 ENCOUNTER — Ambulatory Visit: Admission: RE | Admit: 2020-04-18 | Payer: BC Managed Care – PPO | Source: Ambulatory Visit

## 2020-04-18 DIAGNOSIS — Z510 Encounter for antineoplastic radiation therapy: Secondary | ICD-10-CM | POA: Diagnosis not present

## 2020-04-18 DIAGNOSIS — C50212 Malignant neoplasm of upper-inner quadrant of left female breast: Secondary | ICD-10-CM | POA: Diagnosis not present

## 2020-04-18 DIAGNOSIS — Z170 Estrogen receptor positive status [ER+]: Secondary | ICD-10-CM | POA: Diagnosis not present

## 2020-04-19 ENCOUNTER — Ambulatory Visit
Admission: RE | Admit: 2020-04-19 | Discharge: 2020-04-19 | Disposition: A | Discharge status: Home / Self Care | Payer: BC Managed Care – PPO | Source: Ambulatory Visit | Attending: Radiation Oncology | Admitting: Radiation Oncology

## 2020-04-19 ENCOUNTER — Ambulatory Visit (INDEPENDENT_AMBULATORY_CARE_PROVIDER_SITE_OTHER): Payer: BC Managed Care – PPO | Admitting: Family Medicine

## 2020-04-19 ENCOUNTER — Encounter: Payer: Self-pay | Admitting: Family Medicine

## 2020-04-19 ENCOUNTER — Other Ambulatory Visit: Payer: Self-pay

## 2020-04-19 VITALS — BP 126/64 | HR 62 | Temp 96.9°F | Ht 64.25 in | Wt 150.4 lb

## 2020-04-19 DIAGNOSIS — M85859 Other specified disorders of bone density and structure, unspecified thigh: Secondary | ICD-10-CM

## 2020-04-19 DIAGNOSIS — Z0000 Encounter for general adult medical examination without abnormal findings: Secondary | ICD-10-CM | POA: Diagnosis not present

## 2020-04-19 DIAGNOSIS — Z170 Estrogen receptor positive status [ER+]: Secondary | ICD-10-CM

## 2020-04-19 DIAGNOSIS — I10 Essential (primary) hypertension: Secondary | ICD-10-CM | POA: Diagnosis not present

## 2020-04-19 DIAGNOSIS — Z510 Encounter for antineoplastic radiation therapy: Secondary | ICD-10-CM | POA: Diagnosis not present

## 2020-04-19 DIAGNOSIS — Z23 Encounter for immunization: Secondary | ICD-10-CM | POA: Diagnosis not present

## 2020-04-19 DIAGNOSIS — E559 Vitamin D deficiency, unspecified: Secondary | ICD-10-CM

## 2020-04-19 DIAGNOSIS — C50212 Malignant neoplasm of upper-inner quadrant of left female breast: Secondary | ICD-10-CM | POA: Diagnosis not present

## 2020-04-19 DIAGNOSIS — Z1211 Encounter for screening for malignant neoplasm of colon: Secondary | ICD-10-CM

## 2020-04-19 DIAGNOSIS — E7800 Pure hypercholesterolemia, unspecified: Secondary | ICD-10-CM

## 2020-04-19 MED ORDER — LISINOPRIL 10 MG PO TABS
10.0000 mg | ORAL_TABLET | Freq: Every day | ORAL | 3 refills | Status: DC
Start: 2020-04-19 — End: 2021-04-23

## 2020-04-19 MED ORDER — AMLODIPINE BESYLATE 5 MG PO TABS
5.0000 mg | ORAL_TABLET | Freq: Every day | ORAL | 3 refills | Status: DC
Start: 2020-04-19 — End: 2021-04-23

## 2020-04-19 MED ORDER — ATORVASTATIN CALCIUM 20 MG PO TABS
20.0000 mg | ORAL_TABLET | Freq: Every day | ORAL | 3 refills | Status: DC
Start: 2020-04-19 — End: 2021-04-23

## 2020-04-19 NOTE — Patient Instructions (Addendum)
If you are interested in the shingles vaccine series (Shingrix), call your insurance or pharmacy to check on coverage and location it must be given.  If affordable - you can schedule it here or at your pharmacy depending on coverage    Pneumonia vaccine today   Please call to get a flu vaccine in a month   Take care of yourself  Eat a healthy diet and gradually return to walking

## 2020-04-19 NOTE — Assessment & Plan Note (Signed)
3 y recall for colonoscopy will be January  Will see where she is with her breast cancer at this tme

## 2020-04-19 NOTE — Assessment & Plan Note (Signed)
Reviewed health habits including diet and exercise and skin cancer prevention Reviewed appropriate screening tests for age  Also reviewed health mt list, fam hx and immunization status , as well as social and family history   See HPI Labs reviewed Mild anemia likely from recent breast surgery  She is covid immunized  pna 23 given today  Will get flu shot in about a month  Continues tx for breast cancer  Sees dermatology for aks /skin care  Disc shingrix vaccine in the future

## 2020-04-19 NOTE — Progress Notes (Signed)
Subjective:    Patient ID: Brittney Johnson, female    DOB: 27-Jan-1956, 64 y.o.   MRN: [id number]  This visit occurred during the SARS-CoV-2 public health emergency.  Safety protocols were in place, including screening questions prior to the visit, additional usage of staff PPE, and extensive cleaning of exam room while observing appropriate contact time as indicated for disinfecting solutions.    HPI Here for health maintenance exam and to review chronic medical problems   Wt Readings from Last 3 Encounters:  04/19/20 150 lb 7 oz (68.2 kg)  04/13/20 153 lb (69.4 kg)  04/04/20 151 lb 12.8 oz (68.9 kg)   25.62 kg/m  Has overeaten - due to stress  Has not started any exercise- plans to get back when able  Went walking a few days ago   Colonoscopy 1/18 with 3 y recall   Flu vaccine -will get in a month covid status -immunized pfizer Tdap 6/21  pna vaccine -had prevnar a year ago and due for pna 23   Mammogram 7/21  Currently going through breast cancer treatment  Had partial mastectomy and LN bx Had radiation/no chemo Planned antihormone tx Self breast exam -healing   dexa 10/20 -osteopenia FN Falls-none  Fractures -none  Supplements-ca and D D level is 32.9 Exercise - walking   zostavax 2016    Sees dermatology- went for a visit last week  Some cryo tx   HTN  bp is stable today  No cp or palpitations or headaches or edema  No side effects to medicines -amlodipine, lisinopril  BP Readings from Last 3 Encounters:  04/19/20 126/64  04/13/20 138/68  04/04/20 135/81     Pulse Readings from Last 3 Encounters:  04/19/20 62  04/13/20 75  04/04/20 70    Hyperlipidemia Lab Results  Component Value Date   CHOL 199 04/14/2020   CHOL 191 04/16/2019   CHOL 181 04/09/2018   Lab Results  Component Value Date   HDL 66.40 04/14/2020   HDL 60.50 04/16/2019   HDL 61.00 04/09/2018   Lab Results  Component Value Date   LDLCALC 111 (H) 04/14/2020   LDLCALC  108 (H) 04/16/2019   LDLCALC 100 (H) 04/09/2018   Lab Results  Component Value Date   TRIG 109.0 04/14/2020   TRIG 109.0 04/16/2019   TRIG 101.0 04/09/2018   Lab Results  Component Value Date   CHOLHDL 3 04/14/2020   CHOLHDL 3 04/16/2019   CHOLHDL 3 04/09/2018   Lab Results  Component Value Date   LDLDIRECT 160.9 03/02/2013   LDLDIRECT 152.1 02/03/2012   LDLDIRECT 128.7 05/31/2011  atorvastatin  Getting back to better eating now  Also walking   Lab Results  Component Value Date   WBC 4.4 04/14/2020   HGB 11.9 (L) 04/14/2020   HCT 36.0 04/14/2020   MCV 88.7 04/14/2020   PLT 199.0 04/14/2020  glucose 92   Lab Results  Component Value Date   CREATININE 0.73 04/14/2020   BUN 13 04/14/2020   NA 139 04/14/2020   K 4.7 04/14/2020   CL 104 04/14/2020   CO2 30 04/14/2020   Lab Results  Component Value Date   ALT 17 04/14/2020   AST 17 04/14/2020   ALKPHOS 52 04/14/2020   BILITOT 0.5 04/14/2020   Lab Results  Component Value Date   TSH 1.67 04/14/2020     Patient Active Problem List   Diagnosis Date Noted  . Genetic testing 04/10/2020  . Family history of  breast cancer   . Family history of lung cancer   . Carcinoma of upper-inner quadrant of left breast in female, estrogen receptor positive ([place]) 03/01/2020  . Osteopenia 05/15/2019  . Estrogen deficiency 04/19/2019  . Nocturnal leg cramps 04/13/2018  . Colon cancer screening 04/09/2016  . Vitamin D deficiency 04/09/2016  . GERD (gastroesophageal reflux disease) 04/09/2016  . Enlarged thyroid 12/05/2015  . Encounter for routine gynecological examination 02/20/2015  . Near syncope 08/05/2014  . Abnormal EKG 08/05/2014  . Anxiety 08/05/2014  . H/O cold sores 06/06/2014  . Dupuytren's contracture of right hand 02/08/2014  . Stress reaction 03/09/2013  . Routine general medical examination at a health care facility 12/05/2010  . Other screening mammogram 12/05/2010  . Raynaud disease 12/05/2010  .  Hyperlipidemia 06/29/2010  . HYPERTENSION, BENIGN ESSENTIAL 06/29/2010  . Lumbar back pain with radiculopathy affecting right lower extremity 06/29/2010   Past Medical History:  Diagnosis Date  . Atypical pneumonia 1998   not hosp  . DDD (degenerative disc disease), cervical   . Family history of breast cancer   . Family history of lung cancer   . Hyperlipidemia   . Hypertension   . Low back pain    on and off  . PONV (postoperative nausea and vomiting)    due to some sedations!  . Syncope and collapse 07/16/2007   Past Surgical History:  Procedure Laterality Date  . ABDOMINAL HYSTERECTOMY     supracervical- L ovary remains  . BREAST BIOPSY Left 02/22/2020   Q clip [place] bx, IMC  . CARDIOVASCULAR STRESS TEST     08/28/06 normal   . left hand surgery Left 2016   Dupuytrens  . LUMBAR DISC SURGERY  08/2017   [facility]  . OOPHORECTOMY Right    Right ovary removed   . PARTIAL MASTECTOMY WITH NEEDLE LOCALIZATION AND AXILLARY SENTINEL LYMPH NODE BX Left 03/15/2020   Procedure: PARTIAL MASTECTOMY WITH RF TAG PLACEMENT AND AXILLARY SENTINEL LYMPH NODE BX;  Surgeon: Herbert Pun, MD;  Location: [facility] ORS;  Service: General;  Laterality: Left;  . TUBAL LIGATION     ? 1979   Social History   Tobacco Use  . Smoking status: Former Smoker    Packs/day: 0.50    Years: 5.00    Pack years: 2.50    Quit date: 07/29/1997    Years since quitting: 22.7  . Smokeless tobacco: Never Used  Substance Use Topics  . Alcohol use: Yes    Alcohol/week: 0.0 standard drinks    Comment: occ  . Drug use: No   Family History  Problem Relation Age of Onset  . Lung cancer Father 62  . Hyperlipidemia Mother   . Diabetes Mother   . Hypertension Mother   . Stroke Mother   . Heart disease Other   . Lung cancer Other        uncle  . Diabetes Other        aunt  . Coronary artery disease Maternal Grandfather   . Breast cancer Maternal Aunt        dx under 11  . Lung cancer Maternal Uncle   . Lung  cancer Paternal Uncle   . Cancer Cousin        unk types  . Cancer Cousin        unk types  . Thyroid disease Neg Hx    Allergies  Allergen Reactions  . Codeine     REACTION: passed out  . Bee Venom Swelling  Current Outpatient Medications on File Prior to Visit  Medication Sig Dispense Refill  . aspirin 81 MG EC tablet Take 81 mg by mouth every Monday, Wednesday, and Friday.     Marland Kitchen Lifitegrast (XIIDRA) 5 % SOLN Place 1 drop into both eyes at bedtime.     . Multiple Minerals-Vitamins (CALCIUM-MAGNESIUM-ZINC-D3) TABS Take 2 tablets by mouth daily.    . Multiple Vitamin (MULTIVITAMIN) tablet Take 1 tablet by mouth daily.    . Omega-3 Fatty Acids (FISH OIL) 1200 MG CAPS Take 1,200 mg by mouth 2 (two) times daily.      No current facility-administered medications on file prior to visit.    Review of Systems  Constitutional: Positive for fatigue. Negative for activity change, appetite change, fever and unexpected weight change.  HENT: Negative for congestion, rhinorrhea, sore throat and trouble swallowing.   Eyes: Negative for pain, redness, itching and visual disturbance.  Respiratory: Negative for cough, chest tightness, shortness of breath and wheezing.   Cardiovascular: Negative for chest pain and palpitations.  Gastrointestinal: Negative for abdominal pain, blood in stool, constipation, diarrhea and nausea.  Endocrine: Negative for cold intolerance, heat intolerance, polydipsia and polyuria.  Genitourinary: Negative for difficulty urinating, dysuria, frequency and urgency.  Musculoskeletal: Negative for arthralgias, joint swelling and myalgias.  Skin: Negative for pallor and rash.  Neurological: Negative for dizziness, tremors, weakness, numbness and headaches.  Hematological: Negative for adenopathy. Does not bruise/bleed easily.  Psychiatric/Behavioral: Negative for decreased concentration and dysphoric mood. The patient is not nervous/anxious.        Objective:   Physical  Exam Constitutional:      General: She is not in acute distress.    Appearance: Normal appearance. She is well-developed and normal weight. She is not ill-appearing or diaphoretic.  HENT:     Head: Normocephalic and atraumatic.     Right Ear: Tympanic membrane, ear canal and external ear normal.     Left Ear: Tympanic membrane, ear canal and external ear normal.     Nose: Nose normal. No congestion.     Mouth/Throat:     Mouth: Mucous membranes are moist.     Pharynx: Oropharynx is clear. No posterior oropharyngeal erythema.  Eyes:     General: No scleral icterus.    Extraocular Movements: Extraocular movements intact.     Conjunctiva/sclera: Conjunctivae normal.     Pupils: Pupils are equal, round, and reactive to light.  Neck:     Thyroid: No thyromegaly.     Vascular: No carotid bruit or JVD.  Cardiovascular:     Rate and Rhythm: Normal rate and regular rhythm.     Pulses: Normal pulses.     Heart sounds: Normal heart sounds. No gallop.   Pulmonary:     Effort: Pulmonary effort is normal. No respiratory distress.     Breath sounds: Normal breath sounds. No wheezing.     Comments: Good air exch Chest:     Chest wall: No tenderness.  Abdominal:     General: Bowel sounds are normal. There is no distension or abdominal bruit.     Palpations: Abdomen is soft. There is no mass.     Tenderness: There is no abdominal tenderness.     Hernia: No hernia is present.  Genitourinary:    Comments: Mastectomy scar on L is healing /some swelling noted Musculoskeletal:        General: No tenderness. Normal range of motion.     Cervical back: Normal range of motion and neck  supple. No rigidity. No muscular tenderness.     Right lower leg: No edema.     Left lower leg: No edema.  Lymphadenopathy:     Cervical: No cervical adenopathy.  Skin:    General: Skin is warm and dry.     Coloration: Skin is not pale.     Findings: No erythema or rash.     Comments: Solar lentigines diffusely    Some recently tx areas (cryo) on legs that are healing   Neurological:     Mental Status: She is alert. Mental status is at baseline.     Cranial Nerves: No cranial nerve deficit.     Motor: No abnormal muscle tone.     Coordination: Coordination normal.     Gait: Gait normal.     Deep Tendon Reflexes: Reflexes are normal and symmetric. Reflexes normal.  Psychiatric:        Mood and Affect: Mood normal.        Cognition and Memory: Cognition and memory normal.           Assessment & Plan:   Problem List Items Addressed This Visit      Cardiovascular and Mediastinum   HYPERTENSION, BENIGN ESSENTIAL    bp in fair control at this time  BP Readings from Last 1 Encounters:  04/19/20 126/64   No changes needed Continues amlodipine and lisinopril  Most recent labs reviewed  Disc lifstyle change with low sodium diet and exercise        Relevant Medications   amLODipine (NORVASC) 5 MG tablet   atorvastatin (LIPITOR) 20 MG tablet   lisinopril (ZESTRIL) 10 MG tablet     Musculoskeletal and Integument   Osteopenia    dexa 10/20 -osteopenia of FN Current dx of breast cancer in tx Will eventually be on anti est tx and then will need med for bone health  Her oncologist discussed bisphosphonate - I agree with that advisement  No falls/fx Will inc vit D intake for level in 30s        Other   Hyperlipidemia    Disc goals for lipids and reasons to control them Rev last labs with pt Rev low sat fat diet in detail  Continues atorvastatin and diet  stable      Relevant Medications   amLODipine (NORVASC) 5 MG tablet   atorvastatin (LIPITOR) 20 MG tablet   lisinopril (ZESTRIL) 10 MG tablet   Routine general medical examination at a health care facility - Primary    Reviewed health habits including diet and exercise and skin cancer prevention Reviewed appropriate screening tests for age  Also reviewed health mt list, fam hx and immunization status , as well as social and  family history   See HPI Labs reviewed Mild anemia likely from recent breast surgery  She is covid immunized  pna 23 given today  Will get flu shot in about a month  Continues tx for breast cancer  Sees dermatology for aks /skin care  Disc shingrix vaccine in the future       Relevant Orders   Pneumococcal polysaccharide vaccine 23-valent greater than or equal to 2yo subcutaneous/IM (Completed)   Colon cancer screening    3 y recall for colonoscopy will be January  Will see where she is with her breast cancer at this tme       Vitamin D deficiency    Level in 29s  Enc her to add another 2000 iu daily  Disc imp  to bone and overall health       Carcinoma of upper-inner quadrant of left breast in female, estrogen receptor positive ([place])    In process of treatment  S/p partial mastectomy  For radiation starting tomorrow No chemo planned currently  Will need anti estrogen        Other Visit Diagnoses    Need for 23-polyvalent pneumococcal polysaccharide vaccine       Relevant Orders   Pneumococcal polysaccharide vaccine 23-valent greater than or equal to 2yo subcutaneous/IM (Completed)

## 2020-04-19 NOTE — Assessment & Plan Note (Signed)
In process of treatment  S/p partial mastectomy  For radiation starting tomorrow No chemo planned currently  Will need anti estrogen

## 2020-04-19 NOTE — Assessment & Plan Note (Signed)
dexa 10/20 -osteopenia of FN Current dx of breast cancer in tx Will eventually be on anti est tx and then will need med for bone health  Her oncologist discussed bisphosphonate - I agree with that advisement  No falls/fx Will inc vit D intake for level in 29s

## 2020-04-19 NOTE — Assessment & Plan Note (Signed)
Disc goals for lipids and reasons to control them Rev last labs with pt Rev low sat fat diet in detail  Continues atorvastatin and diet  stable

## 2020-04-19 NOTE — Assessment & Plan Note (Signed)
bp in fair control at this time  BP Readings from Last 1 Encounters:  04/19/20 126/64   No changes needed Continues amlodipine and lisinopril  Most recent labs reviewed  Disc lifstyle change with low sodium diet and exercise

## 2020-04-19 NOTE — Assessment & Plan Note (Signed)
Level in 72s  Enc her to add another 2000 iu daily  Disc imp to bone and overall health

## 2020-04-20 ENCOUNTER — Ambulatory Visit
Admission: RE | Admit: 2020-04-20 | Discharge: 2020-04-20 | Disposition: A | Discharge status: Home / Self Care | Payer: BC Managed Care – PPO | Source: Ambulatory Visit | Attending: Radiation Oncology | Admitting: Radiation Oncology

## 2020-04-20 DIAGNOSIS — Z510 Encounter for antineoplastic radiation therapy: Secondary | ICD-10-CM | POA: Diagnosis not present

## 2020-04-20 DIAGNOSIS — Z170 Estrogen receptor positive status [ER+]: Secondary | ICD-10-CM | POA: Diagnosis not present

## 2020-04-20 DIAGNOSIS — C50212 Malignant neoplasm of upper-inner quadrant of left female breast: Secondary | ICD-10-CM | POA: Diagnosis not present

## 2020-04-21 ENCOUNTER — Ambulatory Visit
Admission: RE | Admit: 2020-04-21 | Discharge: 2020-04-21 | Disposition: A | Discharge status: Home / Self Care | Payer: BC Managed Care – PPO | Source: Ambulatory Visit | Attending: Radiation Oncology | Admitting: Radiation Oncology

## 2020-04-21 DIAGNOSIS — C50212 Malignant neoplasm of upper-inner quadrant of left female breast: Secondary | ICD-10-CM | POA: Diagnosis not present

## 2020-04-21 DIAGNOSIS — Z170 Estrogen receptor positive status [ER+]: Secondary | ICD-10-CM | POA: Diagnosis not present

## 2020-04-21 DIAGNOSIS — Z510 Encounter for antineoplastic radiation therapy: Secondary | ICD-10-CM | POA: Diagnosis not present

## 2020-04-24 ENCOUNTER — Ambulatory Visit
Admission: RE | Admit: 2020-04-24 | Discharge: 2020-04-24 | Disposition: A | Discharge status: Home / Self Care | Payer: BC Managed Care – PPO | Source: Ambulatory Visit | Attending: Radiation Oncology | Admitting: Radiation Oncology

## 2020-04-24 DIAGNOSIS — C50212 Malignant neoplasm of upper-inner quadrant of left female breast: Secondary | ICD-10-CM | POA: Diagnosis not present

## 2020-04-24 DIAGNOSIS — Z170 Estrogen receptor positive status [ER+]: Secondary | ICD-10-CM | POA: Diagnosis not present

## 2020-04-24 DIAGNOSIS — Z510 Encounter for antineoplastic radiation therapy: Secondary | ICD-10-CM | POA: Diagnosis not present

## 2020-04-25 ENCOUNTER — Ambulatory Visit
Admission: RE | Admit: 2020-04-25 | Discharge: 2020-04-25 | Disposition: A | Discharge status: Home / Self Care | Payer: BC Managed Care – PPO | Source: Ambulatory Visit | Attending: Radiation Oncology | Admitting: Radiation Oncology

## 2020-04-25 DIAGNOSIS — Z510 Encounter for antineoplastic radiation therapy: Secondary | ICD-10-CM | POA: Diagnosis not present

## 2020-04-25 DIAGNOSIS — Z170 Estrogen receptor positive status [ER+]: Secondary | ICD-10-CM | POA: Diagnosis not present

## 2020-04-25 DIAGNOSIS — C50212 Malignant neoplasm of upper-inner quadrant of left female breast: Secondary | ICD-10-CM | POA: Diagnosis not present

## 2020-04-26 ENCOUNTER — Ambulatory Visit
Admission: RE | Admit: 2020-04-26 | Discharge: 2020-04-26 | Disposition: A | Discharge status: Home / Self Care | Payer: BC Managed Care – PPO | Source: Ambulatory Visit | Attending: Radiation Oncology | Admitting: Radiation Oncology

## 2020-04-26 DIAGNOSIS — Z170 Estrogen receptor positive status [ER+]: Secondary | ICD-10-CM | POA: Diagnosis not present

## 2020-04-26 DIAGNOSIS — C50212 Malignant neoplasm of upper-inner quadrant of left female breast: Secondary | ICD-10-CM | POA: Diagnosis not present

## 2020-04-26 DIAGNOSIS — Z510 Encounter for antineoplastic radiation therapy: Secondary | ICD-10-CM | POA: Diagnosis not present

## 2020-04-27 ENCOUNTER — Ambulatory Visit
Admission: RE | Admit: 2020-04-27 | Discharge: 2020-04-27 | Disposition: A | Discharge status: Home / Self Care | Payer: BC Managed Care – PPO | Source: Ambulatory Visit | Attending: Radiation Oncology | Admitting: Radiation Oncology

## 2020-04-27 ENCOUNTER — Inpatient Hospital Stay: Payer: BC Managed Care – PPO

## 2020-04-27 ENCOUNTER — Other Ambulatory Visit: Payer: Self-pay

## 2020-04-27 DIAGNOSIS — Z9079 Acquired absence of other genital organ(s): Secondary | ICD-10-CM | POA: Diagnosis not present

## 2020-04-27 DIAGNOSIS — I10 Essential (primary) hypertension: Secondary | ICD-10-CM | POA: Diagnosis not present

## 2020-04-27 DIAGNOSIS — Z87891 Personal history of nicotine dependence: Secondary | ICD-10-CM | POA: Diagnosis not present

## 2020-04-27 DIAGNOSIS — Z170 Estrogen receptor positive status [ER+]: Secondary | ICD-10-CM | POA: Diagnosis not present

## 2020-04-27 DIAGNOSIS — E785 Hyperlipidemia, unspecified: Secondary | ICD-10-CM | POA: Diagnosis not present

## 2020-04-27 DIAGNOSIS — Z833 Family history of diabetes mellitus: Secondary | ICD-10-CM | POA: Diagnosis not present

## 2020-04-27 DIAGNOSIS — Z801 Family history of malignant neoplasm of trachea, bronchus and lung: Secondary | ICD-10-CM | POA: Diagnosis not present

## 2020-04-27 DIAGNOSIS — Z79899 Other long term (current) drug therapy: Secondary | ICD-10-CM | POA: Diagnosis not present

## 2020-04-27 DIAGNOSIS — Z7982 Long term (current) use of aspirin: Secondary | ICD-10-CM | POA: Diagnosis not present

## 2020-04-27 DIAGNOSIS — C50212 Malignant neoplasm of upper-inner quadrant of left female breast: Secondary | ICD-10-CM

## 2020-04-27 DIAGNOSIS — Z90721 Acquired absence of ovaries, unilateral: Secondary | ICD-10-CM | POA: Diagnosis not present

## 2020-04-27 DIAGNOSIS — Z90710 Acquired absence of both cervix and uterus: Secondary | ICD-10-CM | POA: Diagnosis not present

## 2020-04-27 DIAGNOSIS — Z8249 Family history of ischemic heart disease and other diseases of the circulatory system: Secondary | ICD-10-CM | POA: Diagnosis not present

## 2020-04-27 DIAGNOSIS — Z510 Encounter for antineoplastic radiation therapy: Secondary | ICD-10-CM | POA: Diagnosis not present

## 2020-04-27 DIAGNOSIS — Z803 Family history of malignant neoplasm of breast: Secondary | ICD-10-CM | POA: Diagnosis not present

## 2020-04-27 LAB — CBC
HCT: 35.4 % — ABNORMAL LOW (ref 36.0–46.0)
Hemoglobin: 12 g/dL (ref 12.0–15.0)
MCH: 29.1 pg (ref 26.0–34.0)
MCHC: 33.9 g/dL (ref 30.0–36.0)
MCV: 85.9 fL (ref 80.0–100.0)
Platelets: 245 10*3/uL (ref 150–400)
RBC: 4.12 MIL/uL (ref 3.87–5.11)
RDW: 13.2 % (ref 11.5–15.5)
WBC: 5 10*3/uL (ref 4.0–10.5)
nRBC: 0 % (ref 0.0–0.2)

## 2020-04-28 ENCOUNTER — Ambulatory Visit
Admission: RE | Admit: 2020-04-28 | Discharge: 2020-04-28 | Disposition: A | Discharge status: Home / Self Care | Payer: BC Managed Care – PPO | Source: Ambulatory Visit | Attending: Radiation Oncology | Admitting: Radiation Oncology

## 2020-04-28 DIAGNOSIS — Z510 Encounter for antineoplastic radiation therapy: Secondary | ICD-10-CM | POA: Diagnosis not present

## 2020-04-28 DIAGNOSIS — C50212 Malignant neoplasm of upper-inner quadrant of left female breast: Secondary | ICD-10-CM | POA: Diagnosis not present

## 2020-04-28 DIAGNOSIS — Z170 Estrogen receptor positive status [ER+]: Secondary | ICD-10-CM | POA: Insufficient documentation

## 2020-05-01 ENCOUNTER — Ambulatory Visit
Admission: RE | Admit: 2020-05-01 | Discharge: 2020-05-01 | Disposition: A | Discharge status: Home / Self Care | Payer: BC Managed Care – PPO | Source: Ambulatory Visit | Attending: Radiation Oncology | Admitting: Radiation Oncology

## 2020-05-01 DIAGNOSIS — Z170 Estrogen receptor positive status [ER+]: Secondary | ICD-10-CM | POA: Diagnosis not present

## 2020-05-01 DIAGNOSIS — C50212 Malignant neoplasm of upper-inner quadrant of left female breast: Secondary | ICD-10-CM | POA: Diagnosis not present

## 2020-05-01 DIAGNOSIS — Z510 Encounter for antineoplastic radiation therapy: Secondary | ICD-10-CM | POA: Diagnosis not present

## 2020-05-02 ENCOUNTER — Ambulatory Visit
Admission: RE | Admit: 2020-05-02 | Discharge: 2020-05-02 | Disposition: A | Discharge status: Home / Self Care | Payer: BC Managed Care – PPO | Source: Ambulatory Visit | Attending: Radiation Oncology | Admitting: Radiation Oncology

## 2020-05-02 DIAGNOSIS — C50212 Malignant neoplasm of upper-inner quadrant of left female breast: Secondary | ICD-10-CM | POA: Diagnosis not present

## 2020-05-02 DIAGNOSIS — Z510 Encounter for antineoplastic radiation therapy: Secondary | ICD-10-CM | POA: Diagnosis not present

## 2020-05-02 DIAGNOSIS — Z170 Estrogen receptor positive status [ER+]: Secondary | ICD-10-CM | POA: Diagnosis not present

## 2020-05-03 ENCOUNTER — Ambulatory Visit
Admission: RE | Admit: 2020-05-03 | Discharge: 2020-05-03 | Disposition: A | Discharge status: Home / Self Care | Payer: BC Managed Care – PPO | Source: Ambulatory Visit | Attending: Radiation Oncology | Admitting: Radiation Oncology

## 2020-05-03 DIAGNOSIS — C50212 Malignant neoplasm of upper-inner quadrant of left female breast: Secondary | ICD-10-CM | POA: Diagnosis not present

## 2020-05-03 DIAGNOSIS — Z510 Encounter for antineoplastic radiation therapy: Secondary | ICD-10-CM | POA: Diagnosis not present

## 2020-05-03 DIAGNOSIS — Z170 Estrogen receptor positive status [ER+]: Secondary | ICD-10-CM | POA: Diagnosis not present

## 2020-05-04 ENCOUNTER — Ambulatory Visit
Admission: RE | Admit: 2020-05-04 | Discharge: 2020-05-04 | Disposition: A | Discharge status: Home / Self Care | Payer: BC Managed Care – PPO | Source: Ambulatory Visit | Attending: Radiation Oncology | Admitting: Radiation Oncology

## 2020-05-04 DIAGNOSIS — Z170 Estrogen receptor positive status [ER+]: Secondary | ICD-10-CM | POA: Diagnosis not present

## 2020-05-04 DIAGNOSIS — Z510 Encounter for antineoplastic radiation therapy: Secondary | ICD-10-CM | POA: Diagnosis not present

## 2020-05-04 DIAGNOSIS — C50212 Malignant neoplasm of upper-inner quadrant of left female breast: Secondary | ICD-10-CM | POA: Diagnosis not present

## 2020-05-05 ENCOUNTER — Ambulatory Visit
Admission: RE | Admit: 2020-05-05 | Discharge: 2020-05-05 | Disposition: A | Discharge status: Home / Self Care | Payer: BC Managed Care – PPO | Source: Ambulatory Visit | Attending: Radiation Oncology | Admitting: Radiation Oncology

## 2020-05-05 DIAGNOSIS — C50212 Malignant neoplasm of upper-inner quadrant of left female breast: Secondary | ICD-10-CM | POA: Diagnosis not present

## 2020-05-05 DIAGNOSIS — Z510 Encounter for antineoplastic radiation therapy: Secondary | ICD-10-CM | POA: Diagnosis not present

## 2020-05-05 DIAGNOSIS — Z170 Estrogen receptor positive status [ER+]: Secondary | ICD-10-CM | POA: Diagnosis not present

## 2020-05-08 ENCOUNTER — Ambulatory Visit
Admission: RE | Admit: 2020-05-08 | Discharge: 2020-05-08 | Disposition: A | Discharge status: Home / Self Care | Payer: BC Managed Care – PPO | Source: Ambulatory Visit | Attending: Radiation Oncology | Admitting: Radiation Oncology

## 2020-05-08 DIAGNOSIS — C50212 Malignant neoplasm of upper-inner quadrant of left female breast: Secondary | ICD-10-CM | POA: Diagnosis not present

## 2020-05-08 DIAGNOSIS — Z170 Estrogen receptor positive status [ER+]: Secondary | ICD-10-CM | POA: Diagnosis not present

## 2020-05-08 DIAGNOSIS — Z510 Encounter for antineoplastic radiation therapy: Secondary | ICD-10-CM | POA: Diagnosis not present

## 2020-05-09 ENCOUNTER — Ambulatory Visit
Admission: RE | Admit: 2020-05-09 | Discharge: 2020-05-09 | Disposition: A | Discharge status: Home / Self Care | Payer: BC Managed Care – PPO | Source: Ambulatory Visit | Attending: Radiation Oncology | Admitting: Radiation Oncology

## 2020-05-09 DIAGNOSIS — C50212 Malignant neoplasm of upper-inner quadrant of left female breast: Secondary | ICD-10-CM | POA: Diagnosis not present

## 2020-05-09 DIAGNOSIS — Z170 Estrogen receptor positive status [ER+]: Secondary | ICD-10-CM | POA: Diagnosis not present

## 2020-05-09 DIAGNOSIS — Z510 Encounter for antineoplastic radiation therapy: Secondary | ICD-10-CM | POA: Diagnosis not present

## 2020-05-10 ENCOUNTER — Ambulatory Visit
Admission: RE | Admit: 2020-05-10 | Discharge: 2020-05-10 | Disposition: A | Discharge status: Home / Self Care | Payer: BC Managed Care – PPO | Source: Ambulatory Visit | Attending: Radiation Oncology | Admitting: Radiation Oncology

## 2020-05-10 DIAGNOSIS — C50212 Malignant neoplasm of upper-inner quadrant of left female breast: Secondary | ICD-10-CM | POA: Diagnosis not present

## 2020-05-10 DIAGNOSIS — Z510 Encounter for antineoplastic radiation therapy: Secondary | ICD-10-CM | POA: Diagnosis not present

## 2020-05-10 DIAGNOSIS — Z170 Estrogen receptor positive status [ER+]: Secondary | ICD-10-CM | POA: Diagnosis not present

## 2020-05-11 ENCOUNTER — Ambulatory Visit
Admission: RE | Admit: 2020-05-11 | Discharge: 2020-05-11 | Disposition: A | Discharge status: Home / Self Care | Payer: BC Managed Care – PPO | Source: Ambulatory Visit | Attending: Radiation Oncology | Admitting: Radiation Oncology

## 2020-05-11 ENCOUNTER — Inpatient Hospital Stay: Payer: BC Managed Care – PPO | Attending: Radiation Oncology

## 2020-05-11 ENCOUNTER — Other Ambulatory Visit: Payer: Self-pay

## 2020-05-11 DIAGNOSIS — C50212 Malignant neoplasm of upper-inner quadrant of left female breast: Secondary | ICD-10-CM | POA: Insufficient documentation

## 2020-05-11 DIAGNOSIS — Z510 Encounter for antineoplastic radiation therapy: Secondary | ICD-10-CM | POA: Diagnosis not present

## 2020-05-11 DIAGNOSIS — Z170 Estrogen receptor positive status [ER+]: Secondary | ICD-10-CM | POA: Diagnosis not present

## 2020-05-11 LAB — CBC
HCT: 35.7 % — ABNORMAL LOW (ref 36.0–46.0)
Hemoglobin: 12 g/dL (ref 12.0–15.0)
MCH: 29.6 pg (ref 26.0–34.0)
MCHC: 33.6 g/dL (ref 30.0–36.0)
MCV: 87.9 fL (ref 80.0–100.0)
Platelets: 212 10*3/uL (ref 150–400)
RBC: 4.06 MIL/uL (ref 3.87–5.11)
RDW: 13.2 % (ref 11.5–15.5)
WBC: 4.1 10*3/uL (ref 4.0–10.5)
nRBC: 0 % (ref 0.0–0.2)

## 2020-05-12 ENCOUNTER — Ambulatory Visit
Admission: RE | Admit: 2020-05-12 | Discharge: 2020-05-12 | Disposition: A | Discharge status: Home / Self Care | Payer: BC Managed Care – PPO | Source: Ambulatory Visit | Attending: Radiation Oncology | Admitting: Radiation Oncology

## 2020-05-12 DIAGNOSIS — C50212 Malignant neoplasm of upper-inner quadrant of left female breast: Secondary | ICD-10-CM | POA: Diagnosis not present

## 2020-05-12 DIAGNOSIS — Z170 Estrogen receptor positive status [ER+]: Secondary | ICD-10-CM | POA: Diagnosis not present

## 2020-05-12 DIAGNOSIS — Z510 Encounter for antineoplastic radiation therapy: Secondary | ICD-10-CM | POA: Diagnosis not present

## 2020-05-15 ENCOUNTER — Ambulatory Visit
Admission: RE | Admit: 2020-05-15 | Discharge: 2020-05-15 | Disposition: A | Discharge status: Home / Self Care | Payer: BC Managed Care – PPO | Source: Ambulatory Visit | Attending: Radiation Oncology | Admitting: Radiation Oncology

## 2020-05-15 DIAGNOSIS — C50212 Malignant neoplasm of upper-inner quadrant of left female breast: Secondary | ICD-10-CM | POA: Diagnosis not present

## 2020-05-15 DIAGNOSIS — Z170 Estrogen receptor positive status [ER+]: Secondary | ICD-10-CM | POA: Diagnosis not present

## 2020-05-15 DIAGNOSIS — Z510 Encounter for antineoplastic radiation therapy: Secondary | ICD-10-CM | POA: Diagnosis not present

## 2020-05-16 ENCOUNTER — Ambulatory Visit
Admission: RE | Admit: 2020-05-16 | Discharge: 2020-05-16 | Disposition: A | Discharge status: Home / Self Care | Payer: BC Managed Care – PPO | Source: Ambulatory Visit | Attending: Radiation Oncology | Admitting: Radiation Oncology

## 2020-05-16 DIAGNOSIS — Z170 Estrogen receptor positive status [ER+]: Secondary | ICD-10-CM | POA: Diagnosis not present

## 2020-05-16 DIAGNOSIS — Z510 Encounter for antineoplastic radiation therapy: Secondary | ICD-10-CM | POA: Diagnosis not present

## 2020-05-16 DIAGNOSIS — C50212 Malignant neoplasm of upper-inner quadrant of left female breast: Secondary | ICD-10-CM | POA: Diagnosis not present

## 2020-05-17 ENCOUNTER — Ambulatory Visit
Admission: RE | Admit: 2020-05-17 | Discharge: 2020-05-17 | Disposition: A | Discharge status: Home / Self Care | Payer: BC Managed Care – PPO | Source: Ambulatory Visit | Attending: Radiation Oncology | Admitting: Radiation Oncology

## 2020-05-17 DIAGNOSIS — Z510 Encounter for antineoplastic radiation therapy: Secondary | ICD-10-CM | POA: Diagnosis not present

## 2020-05-17 DIAGNOSIS — Z170 Estrogen receptor positive status [ER+]: Secondary | ICD-10-CM | POA: Diagnosis not present

## 2020-05-17 DIAGNOSIS — C50212 Malignant neoplasm of upper-inner quadrant of left female breast: Secondary | ICD-10-CM | POA: Diagnosis not present

## 2020-05-18 ENCOUNTER — Ambulatory Visit
Admission: RE | Admit: 2020-05-18 | Discharge: 2020-05-18 | Disposition: A | Discharge status: Home / Self Care | Payer: BC Managed Care – PPO | Source: Ambulatory Visit | Attending: Radiation Oncology | Admitting: Radiation Oncology

## 2020-05-18 DIAGNOSIS — C50212 Malignant neoplasm of upper-inner quadrant of left female breast: Secondary | ICD-10-CM | POA: Diagnosis not present

## 2020-05-18 DIAGNOSIS — Z510 Encounter for antineoplastic radiation therapy: Secondary | ICD-10-CM | POA: Diagnosis not present

## 2020-05-18 DIAGNOSIS — Z170 Estrogen receptor positive status [ER+]: Secondary | ICD-10-CM | POA: Diagnosis not present

## 2020-05-19 ENCOUNTER — Ambulatory Visit
Admission: RE | Admit: 2020-05-19 | Discharge: 2020-05-19 | Disposition: A | Discharge status: Home / Self Care | Payer: BC Managed Care – PPO | Source: Ambulatory Visit | Attending: Radiation Oncology | Admitting: Radiation Oncology

## 2020-05-19 DIAGNOSIS — C50212 Malignant neoplasm of upper-inner quadrant of left female breast: Secondary | ICD-10-CM | POA: Diagnosis not present

## 2020-05-19 DIAGNOSIS — Z510 Encounter for antineoplastic radiation therapy: Secondary | ICD-10-CM | POA: Diagnosis not present

## 2020-05-19 DIAGNOSIS — Z170 Estrogen receptor positive status [ER+]: Secondary | ICD-10-CM | POA: Diagnosis not present

## 2020-05-21 DIAGNOSIS — C50212 Malignant neoplasm of upper-inner quadrant of left female breast: Secondary | ICD-10-CM | POA: Diagnosis not present

## 2020-05-21 DIAGNOSIS — Z170 Estrogen receptor positive status [ER+]: Secondary | ICD-10-CM | POA: Diagnosis not present

## 2020-05-21 DIAGNOSIS — Z510 Encounter for antineoplastic radiation therapy: Secondary | ICD-10-CM | POA: Diagnosis not present

## 2020-05-22 ENCOUNTER — Ambulatory Visit
Admission: RE | Admit: 2020-05-22 | Discharge: 2020-05-22 | Disposition: A | Discharge status: Home / Self Care | Payer: BC Managed Care – PPO | Source: Ambulatory Visit | Attending: Radiation Oncology | Admitting: Radiation Oncology

## 2020-05-22 DIAGNOSIS — Z510 Encounter for antineoplastic radiation therapy: Secondary | ICD-10-CM | POA: Diagnosis not present

## 2020-05-22 DIAGNOSIS — Z170 Estrogen receptor positive status [ER+]: Secondary | ICD-10-CM | POA: Diagnosis not present

## 2020-05-22 DIAGNOSIS — C50212 Malignant neoplasm of upper-inner quadrant of left female breast: Secondary | ICD-10-CM | POA: Diagnosis not present

## 2020-05-23 ENCOUNTER — Ambulatory Visit
Admission: RE | Admit: 2020-05-23 | Discharge: 2020-05-23 | Disposition: A | Discharge status: Home / Self Care | Payer: BC Managed Care – PPO | Source: Ambulatory Visit | Attending: Radiation Oncology | Admitting: Radiation Oncology

## 2020-05-23 DIAGNOSIS — Z510 Encounter for antineoplastic radiation therapy: Secondary | ICD-10-CM | POA: Diagnosis not present

## 2020-05-23 DIAGNOSIS — C50212 Malignant neoplasm of upper-inner quadrant of left female breast: Secondary | ICD-10-CM | POA: Diagnosis not present

## 2020-05-23 DIAGNOSIS — Z170 Estrogen receptor positive status [ER+]: Secondary | ICD-10-CM | POA: Diagnosis not present

## 2020-05-24 ENCOUNTER — Other Ambulatory Visit: Payer: Self-pay

## 2020-05-24 ENCOUNTER — Ambulatory Visit (INDEPENDENT_AMBULATORY_CARE_PROVIDER_SITE_OTHER): Payer: BC Managed Care – PPO

## 2020-05-24 ENCOUNTER — Ambulatory Visit
Admission: RE | Admit: 2020-05-24 | Discharge: 2020-05-24 | Disposition: A | Discharge status: Home / Self Care | Payer: BC Managed Care – PPO | Source: Ambulatory Visit | Attending: Radiation Oncology | Admitting: Radiation Oncology

## 2020-05-24 DIAGNOSIS — Z23 Encounter for immunization: Secondary | ICD-10-CM

## 2020-05-24 DIAGNOSIS — C50212 Malignant neoplasm of upper-inner quadrant of left female breast: Secondary | ICD-10-CM | POA: Diagnosis not present

## 2020-05-24 DIAGNOSIS — Z170 Estrogen receptor positive status [ER+]: Secondary | ICD-10-CM | POA: Diagnosis not present

## 2020-05-24 DIAGNOSIS — Z510 Encounter for antineoplastic radiation therapy: Secondary | ICD-10-CM | POA: Diagnosis not present

## 2020-05-25 ENCOUNTER — Ambulatory Visit
Admission: RE | Admit: 2020-05-25 | Discharge: 2020-05-25 | Disposition: A | Discharge status: Home / Self Care | Payer: BC Managed Care – PPO | Source: Ambulatory Visit | Attending: Radiation Oncology | Admitting: Radiation Oncology

## 2020-05-25 ENCOUNTER — Inpatient Hospital Stay: Payer: BC Managed Care – PPO

## 2020-05-25 ENCOUNTER — Other Ambulatory Visit: Payer: Self-pay

## 2020-05-25 DIAGNOSIS — Z170 Estrogen receptor positive status [ER+]: Secondary | ICD-10-CM | POA: Diagnosis not present

## 2020-05-25 DIAGNOSIS — C50212 Malignant neoplasm of upper-inner quadrant of left female breast: Secondary | ICD-10-CM | POA: Diagnosis not present

## 2020-05-25 DIAGNOSIS — Z510 Encounter for antineoplastic radiation therapy: Secondary | ICD-10-CM | POA: Diagnosis not present

## 2020-05-25 LAB — CBC
HCT: 35.4 % — ABNORMAL LOW (ref 36.0–46.0)
Hemoglobin: 11.8 g/dL — ABNORMAL LOW (ref 12.0–15.0)
MCH: 29.3 pg (ref 26.0–34.0)
MCHC: 33.3 g/dL (ref 30.0–36.0)
MCV: 87.8 fL (ref 80.0–100.0)
Platelets: 195 10*3/uL (ref 150–400)
RBC: 4.03 MIL/uL (ref 3.87–5.11)
RDW: 13.5 % (ref 11.5–15.5)
WBC: 4.2 10*3/uL (ref 4.0–10.5)
nRBC: 0 % (ref 0.0–0.2)

## 2020-05-26 ENCOUNTER — Ambulatory Visit
Admission: RE | Admit: 2020-05-26 | Discharge: 2020-05-26 | Disposition: A | Discharge status: Home / Self Care | Payer: BC Managed Care – PPO | Source: Ambulatory Visit | Attending: Radiation Oncology | Admitting: Radiation Oncology

## 2020-05-26 DIAGNOSIS — C50212 Malignant neoplasm of upper-inner quadrant of left female breast: Secondary | ICD-10-CM | POA: Diagnosis not present

## 2020-05-26 DIAGNOSIS — Z170 Estrogen receptor positive status [ER+]: Secondary | ICD-10-CM | POA: Diagnosis not present

## 2020-05-26 DIAGNOSIS — Z510 Encounter for antineoplastic radiation therapy: Secondary | ICD-10-CM | POA: Diagnosis not present

## 2020-05-29 ENCOUNTER — Ambulatory Visit
Admission: RE | Admit: 2020-05-29 | Discharge: 2020-05-29 | Disposition: A | Discharge status: Home / Self Care | Payer: BC Managed Care – PPO | Source: Ambulatory Visit | Attending: Radiation Oncology | Admitting: Radiation Oncology

## 2020-05-29 DIAGNOSIS — Z170 Estrogen receptor positive status [ER+]: Secondary | ICD-10-CM | POA: Insufficient documentation

## 2020-05-29 DIAGNOSIS — C50212 Malignant neoplasm of upper-inner quadrant of left female breast: Secondary | ICD-10-CM | POA: Diagnosis not present

## 2020-05-29 DIAGNOSIS — Z510 Encounter for antineoplastic radiation therapy: Secondary | ICD-10-CM | POA: Diagnosis not present

## 2020-05-30 ENCOUNTER — Ambulatory Visit
Admission: RE | Admit: 2020-05-30 | Discharge: 2020-05-30 | Disposition: A | Discharge status: Home / Self Care | Payer: BC Managed Care – PPO | Source: Ambulatory Visit | Attending: Radiation Oncology | Admitting: Radiation Oncology

## 2020-05-30 DIAGNOSIS — C50212 Malignant neoplasm of upper-inner quadrant of left female breast: Secondary | ICD-10-CM | POA: Diagnosis not present

## 2020-05-30 DIAGNOSIS — Z510 Encounter for antineoplastic radiation therapy: Secondary | ICD-10-CM | POA: Diagnosis not present

## 2020-05-30 DIAGNOSIS — Z170 Estrogen receptor positive status [ER+]: Secondary | ICD-10-CM | POA: Diagnosis not present

## 2020-05-31 ENCOUNTER — Ambulatory Visit
Admission: RE | Admit: 2020-05-31 | Discharge: 2020-05-31 | Disposition: A | Discharge status: Home / Self Care | Payer: BC Managed Care – PPO | Source: Ambulatory Visit | Attending: Radiation Oncology | Admitting: Radiation Oncology

## 2020-05-31 DIAGNOSIS — C50212 Malignant neoplasm of upper-inner quadrant of left female breast: Secondary | ICD-10-CM | POA: Diagnosis not present

## 2020-05-31 DIAGNOSIS — Z510 Encounter for antineoplastic radiation therapy: Secondary | ICD-10-CM | POA: Diagnosis not present

## 2020-05-31 DIAGNOSIS — Z170 Estrogen receptor positive status [ER+]: Secondary | ICD-10-CM | POA: Diagnosis not present

## 2020-06-01 ENCOUNTER — Ambulatory Visit
Admission: RE | Admit: 2020-06-01 | Discharge: 2020-06-01 | Disposition: A | Discharge status: Home / Self Care | Payer: BC Managed Care – PPO | Source: Ambulatory Visit | Attending: Radiation Oncology | Admitting: Radiation Oncology

## 2020-06-01 DIAGNOSIS — Z510 Encounter for antineoplastic radiation therapy: Secondary | ICD-10-CM | POA: Diagnosis not present

## 2020-06-01 DIAGNOSIS — C50212 Malignant neoplasm of upper-inner quadrant of left female breast: Secondary | ICD-10-CM | POA: Diagnosis not present

## 2020-06-01 DIAGNOSIS — Z170 Estrogen receptor positive status [ER+]: Secondary | ICD-10-CM | POA: Diagnosis not present

## 2020-06-02 ENCOUNTER — Ambulatory Visit
Admission: RE | Admit: 2020-06-02 | Discharge: 2020-06-02 | Disposition: A | Discharge status: Home / Self Care | Payer: BC Managed Care – PPO | Source: Ambulatory Visit | Attending: Radiation Oncology | Admitting: Radiation Oncology

## 2020-06-02 DIAGNOSIS — Z510 Encounter for antineoplastic radiation therapy: Secondary | ICD-10-CM | POA: Diagnosis not present

## 2020-06-02 DIAGNOSIS — C50212 Malignant neoplasm of upper-inner quadrant of left female breast: Secondary | ICD-10-CM | POA: Diagnosis not present

## 2020-06-02 DIAGNOSIS — Z170 Estrogen receptor positive status [ER+]: Secondary | ICD-10-CM | POA: Diagnosis not present

## 2020-06-08 ENCOUNTER — Encounter: Payer: Self-pay | Admitting: *Deleted

## 2020-06-08 NOTE — Progress Notes (Signed)
Patient has completed her radiation therapy.  Thinking of you card mailed.

## 2020-06-14 ENCOUNTER — Other Ambulatory Visit: Payer: Self-pay

## 2020-06-14 ENCOUNTER — Encounter: Payer: Self-pay | Admitting: Internal Medicine

## 2020-06-14 ENCOUNTER — Inpatient Hospital Stay: Payer: BC Managed Care – PPO | Attending: Internal Medicine | Admitting: Internal Medicine

## 2020-06-14 DIAGNOSIS — C50212 Malignant neoplasm of upper-inner quadrant of left female breast: Secondary | ICD-10-CM | POA: Insufficient documentation

## 2020-06-14 DIAGNOSIS — Z923 Personal history of irradiation: Secondary | ICD-10-CM | POA: Diagnosis not present

## 2020-06-14 DIAGNOSIS — Z7982 Long term (current) use of aspirin: Secondary | ICD-10-CM | POA: Insufficient documentation

## 2020-06-14 DIAGNOSIS — Z79811 Long term (current) use of aromatase inhibitors: Secondary | ICD-10-CM | POA: Diagnosis not present

## 2020-06-14 DIAGNOSIS — Z87891 Personal history of nicotine dependence: Secondary | ICD-10-CM | POA: Diagnosis not present

## 2020-06-14 DIAGNOSIS — Z170 Estrogen receptor positive status [ER+]: Secondary | ICD-10-CM | POA: Insufficient documentation

## 2020-06-14 DIAGNOSIS — E785 Hyperlipidemia, unspecified: Secondary | ICD-10-CM | POA: Insufficient documentation

## 2020-06-14 DIAGNOSIS — M8580 Other specified disorders of bone density and structure, unspecified site: Secondary | ICD-10-CM | POA: Diagnosis not present

## 2020-06-14 DIAGNOSIS — Z79899 Other long term (current) drug therapy: Secondary | ICD-10-CM | POA: Diagnosis not present

## 2020-06-14 DIAGNOSIS — I10 Essential (primary) hypertension: Secondary | ICD-10-CM | POA: Insufficient documentation

## 2020-06-14 MED ORDER — ANASTROZOLE 1 MG PO TABS: 1.0000 mg | ORAL_TABLET | Freq: Every day | ORAL | 1 refills | Status: DC

## 2020-06-14 NOTE — Assessment & Plan Note (Addendum)
#  Left breast cancer stage I ER/PR positive HER-2 negative; s/p lumpectomy; Oncotype recurrence- LOW RISK; s/p RT [nov 5th, 2021]; no adjuvant chemotherapy.  #Again reviewed the data for use of antiestrogen therapy on adjuvant basis.  Discussed the mechanism of action potential side effects including but not limited to arthralgias hot flashes or risk of osteoporosis.  Started prescription for Arimidex.  # BMD OCT 2020- T score+ -1.4-osteopenia.  Discussed the potential risk factors for osteoporosis- age/gender/postmenopausal status/ use of anti-estrogen treatments. Discussed multiple options including exercise/ calcium and vitamin D supplementation/ and also use of bisphosphonates.  Discussed regarding Reclast/Prolia.  We discussed the potential side effects including but not limited to hypocalcemia; osteonecrosis of the jaw.  Patient interested in conservative measures-which I think is reasonable.  I* # DISPOSITION:  # Follow-up in 2 months-MD no labs.  Dr. Jacinto Reap..

## 2020-06-14 NOTE — Progress Notes (Signed)
one [place] NOTE  Patient Care Team: Tower, Wynelle Fanny, MD as PCP - General  CHIEF COMPLAINTS/PURPOSE OF CONSULTATION: Breast cancer  #  Oncology History Overview Note  # LEFT BREAST CA- INVASIVE MAMMARY CARCINOMA, NO SPECIAL TYPE, WITH MICROPAPILLARY  FEATURES; ER/PR Positive [90%]; Her 2 NEG; G-1; 0.8 x 0.7 x 1 cm.  Oncotype recurrence score= 0; 3% distant recurrence; no chemotherapy; adjuvant radiation;  s/p RT [nov 5th, 2021];   # NOV end 221- START Anastrazole[previous HRT*]  # # SURVIVORSHIP:   # GENETICS: No pathogenic variants; VUS*  DIAGNOSIS: Cure  STAGE: 1        ;  GOALS: Cure  CURRENT/MOST RECENT THERAPY :     Carcinoma of upper-inner quadrant of left breast in female, estrogen receptor positive ([place])  03/01/2020 Initial Diagnosis   Carcinoma of upper-inner quadrant of left breast in female, estrogen receptor positive ([place])    Genetic Testing   Negative genetic testing. No pathogenic variants identified on the [facility] Multi-Cancer Panel. VUS in AIP called c.790_792del and VUS in RET called c.1163T>C identified. The report date is 04/07/2020.  The Multi-Cancer Panel offered by [facility] includes sequencing and/or deletion duplication testing of the following 85 genes: AIP, ALK, APC, ATM, AXIN2,BAP1,  BARD1, BLM, BMPR1A, BRCA1, BRCA2, BRIP1, CASR, CDC73, CDH1, CDK4, CDKN1B, CDKN1C, CDKN2A (p14ARF), CDKN2A (p16INK4a), CEBPA, CHEK2, CTNNA1, DICER1, DIS3L2, EGFR (c.2369C>T, p.Thr790Met variant only), EPCAM (Deletion/duplication testing only), FH, FLCN, GATA2, GPC3, GREM1 (Promoter region deletion/duplication testing only), HOXB13 (c.251G>A, p.Gly84Glu), HRAS, KIT, MAX, MEN1, MET, MITF (c.952G>A, p.Glu318Lys variant only), MLH1, MSH2, MSH3, MSH6, MUTYH, NBN, NF1, NF2, NTHL1, PALB2, PDGFRA, PHOX2B, PMS2, POLD1, POLE, POT1, PRKAR1A, PTCH1, PTEN, RAD50, RAD51C, RAD51D, RB1, RECQL4, RET, RNF43, RUNX1, SDHAF2, SDHA (sequence changes only), SDHB, SDHC, SDHD, SMAD4,  SMARCA4, SMARCB1, SMARCE1, STK11, SUFU, TERC, TERT, TMEM127, TP53, TSC1, TSC2, VHL, WRN and WT1.       HISTORY OF PRESENTING ILLNESS:  Brittney Johnson 64 y.o.  female diagnosis of stage I ER/PR positive HER-2 negative breast cancer is here for follow-up.  In the interim patient underwent/placed radiation approximately 4 weeks ago.  Patient had mild erythema from the radiation-otherwise resolved.  Denies any nausea vomiting abdominal pain.  No fevers or chills.   Review of Systems  Constitutional: Negative for chills, diaphoresis, fever, malaise/fatigue and weight loss.  HENT: Negative for nosebleeds and sore throat.   Eyes: Negative for double vision.  Respiratory: Negative for cough, hemoptysis, sputum production, shortness of breath and wheezing.   Cardiovascular: Negative for chest pain, palpitations, orthopnea and leg swelling.  Gastrointestinal: Negative for abdominal pain, blood in stool, constipation, diarrhea, heartburn, melena, nausea and vomiting.  Genitourinary: Negative for dysuria, frequency and urgency.  Musculoskeletal: Negative for back pain and joint pain.  Skin: Negative.  Negative for itching and rash.  Neurological: Negative for dizziness, tingling, focal weakness, weakness and headaches.  Endo/Heme/Allergies: Does not bruise/bleed easily.  Psychiatric/Behavioral: Negative for depression. The patient is not nervous/anxious and does not have insomnia.      MEDICAL HISTORY:  Past Medical History:  Diagnosis Date  . Atypical pneumonia 1998   not hosp  . Breast cancer ([place])   . DDD (degenerative disc disease), cervical   . Family history of breast cancer   . Family history of lung cancer   . Hyperlipidemia   . Hypertension   . Low back pain    on and off  . PONV (postoperative nausea and vomiting)    due to  some sedations!  . Syncope and collapse 07/16/2007    SURGICAL HISTORY: Past Surgical History:  Procedure Laterality Date  . ABDOMINAL  HYSTERECTOMY     supracervical- L ovary remains  . BREAST BIOPSY Left 02/22/2020   Q clip [place] bx, [facility]  . CARDIOVASCULAR STRESS TEST     08/28/06 normal   . left hand surgery Left 2016   Dupuytrens  . LUMBAR DISC SURGERY  08/2017   [facility]  . OOPHORECTOMY Right    Right ovary removed   . PARTIAL MASTECTOMY WITH NEEDLE LOCALIZATION AND AXILLARY SENTINEL LYMPH NODE BX Left 03/15/2020   Procedure: PARTIAL MASTECTOMY WITH RF TAG PLACEMENT AND AXILLARY SENTINEL LYMPH NODE BX;  Surgeon: Herbert Pun, MD;  Location: [facility] ORS;  Service: General;  Laterality: Left;  . TUBAL LIGATION     ? 1979    SOCIAL HISTORY: Social History   Socioeconomic History  . Marital status: Married    Spouse name: Not on file  . Number of children: Not on file  . Years of education: Not on file  . Highest education level: Not on file  Occupational History  . Occupation: Adult nurse: [employer]  Tobacco Use  . Smoking status: Former Smoker    Packs/day: 0.50    Years: 5.00    Pack years: 2.50    Quit date: 07/29/1997    Years since quitting: 22.8  . Smokeless tobacco: Never Used  Vaping Use  . Vaping Use: Never used  Substance and Sexual Activity  . Alcohol use: Yes    Alcohol/week: 0.0 standard drinks    Comment: occ  . Drug use: No  . Sexual activity: Yes  Other Topics Concern  . Not on file  Social History Narrative   Kids live close by, 3 grandkids- really enjoys, runs for exercise, ran a 5k March 2011; walks; quit 20 years; rare alcohol. Work for [employer]; lives in [place]. 2 boys- 28s.    Social Determinants of Health   Financial Resource Strain:   . Difficulty of Paying Living Expenses: Not on file  Food Insecurity:   . Worried About Charity fundraiser in the Last Year: Not on file  . Ran Out of Food in the Last Year: Not on file  Transportation Needs:   . Lack of Transportation (Medical): Not on file  . Lack of Transportation (Non-Medical): Not  on file  Physical Activity:   . Days of Exercise per Week: Not on file  . Minutes of Exercise per Session: Not on file  Stress:   . Feeling of Stress : Not on file  Social Connections:   . Frequency of Communication with Friends and Family: Not on file  . Frequency of Social Gatherings with Friends and Family: Not on file  . Attends Religious Services: Not on file  . Active Member of Clubs or Organizations: Not on file  . Attends Archivist Meetings: Not on file  . Marital Status: Not on file  Intimate Partner Violence:   . Fear of Current or Ex-Partner: Not on file  . Emotionally Abused: Not on file  . Physically Abused: Not on file  . Sexually Abused: Not on file    FAMILY HISTORY: Family History  Problem Relation Age of Onset  . Lung cancer Father 60  . Hyperlipidemia Mother   . Diabetes Mother   . Hypertension Mother   . Stroke Mother   . Heart disease Other   . Lung  cancer Other        uncle  . Diabetes Other        aunt  . Coronary artery disease Maternal Grandfather   . Breast cancer Maternal Aunt        dx under 54  . Lung cancer Maternal Uncle   . Lung cancer Paternal Uncle   . Cancer Cousin        unk types  . Cancer Cousin        unk types  . Thyroid disease Neg Hx     ALLERGIES:  is allergic to codeine and bee venom.  MEDICATIONS:  Current Outpatient Medications  Medication Sig Dispense Refill  . amLODipine (NORVASC) 5 MG tablet Take 1 tablet (5 mg total) by mouth daily. 90 tablet 3  . aspirin 81 MG EC tablet Take 81 mg by mouth every Monday, Wednesday, and Friday.     Marland Kitchen atorvastatin (LIPITOR) 20 MG tablet Take 1 tablet (20 mg total) by mouth daily. 90 tablet 3  . Lifitegrast (XIIDRA) 5 % SOLN Place 1 drop into both eyes at bedtime.     Marland Kitchen lisinopril (ZESTRIL) 10 MG tablet Take 1 tablet (10 mg total) by mouth daily. 90 tablet 3  . Multiple Minerals-Vitamins (CALCIUM-MAGNESIUM-ZINC-D3) TABS Take 2 tablets by mouth daily.    . Multiple  Vitamin (MULTIVITAMIN) tablet Take 1 tablet by mouth daily.    . Omega-3 Fatty Acids (FISH OIL) 1200 MG CAPS Take 1,200 mg by mouth 2 (two) times daily.     Marland Kitchen anastrozole (ARIMIDEX) 1 MG tablet Take 1 tablet (1 mg total) by mouth daily. 90 tablet 1   No current facility-administered medications for this visit.      Marland Kitchen  PHYSICAL EXAMINATION: ECOG PERFORMANCE STATUS: 0 - Asymptomatic  Vitals:   06/14/20 1016  BP: 120/78  Pulse: 69  Resp: 16  Temp: 98.2 F (36.8 C)   Filed Weights   06/14/20 1016  Weight: 155 lb (70.3 kg)    Physical Exam HENT:     Head: Normocephalic and atraumatic.     Mouth/Throat:     Pharynx: No oropharyngeal exudate.  Eyes:     Pupils: Pupils are equal, round, and reactive to light.  Cardiovascular:     Rate and Rhythm: Normal rate and regular rhythm.  Pulmonary:     Effort: Pulmonary effort is normal. No respiratory distress.     Breath sounds: Normal breath sounds. No wheezing.  Abdominal:     General: Bowel sounds are normal. There is no distension.     Palpations: Abdomen is soft. There is no mass.     Tenderness: There is no abdominal tenderness. There is no guarding or rebound.  Musculoskeletal:        General: No tenderness. Normal range of motion.     Cervical back: Normal range of motion and neck supple.  Skin:    General: Skin is warm.  Neurological:     Mental Status: She is alert and oriented to person, place, and time.  Psychiatric:        Mood and Affect: Affect normal.    LABORATORY DATA:  I have reviewed the data as listed Lab Results  Component Value Date   WBC 4.2 05/25/2020   HGB 11.8 (L) 05/25/2020   HCT 35.4 (L) 05/25/2020   MCV 87.8 05/25/2020   PLT 195 05/25/2020   Recent Labs    03/13/20 0918 04/14/20 0835  NA 140 139  K 3.9 4.7  CL 103 104  CO2 27 30  GLUCOSE 91 92  BUN 13 13  CREATININE 0.59 0.73  CALCIUM 9.6 9.9  GFRNONAA >60  --   GFRAA >60  --   PROT  --  7.1  ALBUMIN  --  4.4  AST  --  17   ALT  --  17  ALKPHOS  --  52  BILITOT  --  0.5    RADIOGRAPHIC STUDIES: I have personally reviewed the radiological images as listed and agreed with the findings in the report. No results found.  ASSESSMENT & PLAN:   Carcinoma of upper-inner quadrant of left breast in female, estrogen receptor positive ([place]) #Left breast cancer stage I ER/PR positive HER-2 negative; s/p lumpectomy; Oncotype recurrence- LOW RISK; s/p RT [nov 5th, 2021]; no adjuvant chemotherapy.  #Again reviewed the data for use of antiestrogen therapy on adjuvant basis.  Discussed the mechanism of action potential side effects including but not limited to arthralgias hot flashes or risk of osteoporosis.  Started prescription for Arimidex.  # BMD OCT 2020- T score+ -1.4-osteopenia.  Discussed the potential risk factors for osteoporosis- age/gender/postmenopausal status/ use of anti-estrogen treatments. Discussed multiple options including exercise/ calcium and vitamin D supplementation/ and also use of bisphosphonates.  Discussed regarding Reclast/Prolia.  We discussed the potential side effects including but not limited to hypocalcemia; osteonecrosis of the jaw.  Patient interested in conservative measures-which I think is reasonable.  I* # DISPOSITION:  # Follow-up in 2 months-MD no labs.  Dr. Jacinto Reap..    Cammie Sickle, MD 06/14/2020 7:45 PM

## 2020-07-05 ENCOUNTER — Encounter: Payer: Self-pay | Admitting: Radiation Oncology

## 2020-07-07 ENCOUNTER — Ambulatory Visit
Admission: RE | Admit: 2020-07-07 | Discharge: 2020-07-07 | Disposition: A | Discharge status: Home / Self Care | Payer: BC Managed Care – PPO | Source: Ambulatory Visit | Attending: Radiation Oncology | Admitting: Radiation Oncology

## 2020-07-07 ENCOUNTER — Other Ambulatory Visit: Payer: Self-pay

## 2020-07-07 ENCOUNTER — Encounter: Payer: Self-pay | Admitting: Radiation Oncology

## 2020-07-07 VITALS — BP 129/69 | HR 68 | Temp 97.3°F | Wt 157.7 lb

## 2020-07-07 DIAGNOSIS — C50212 Malignant neoplasm of upper-inner quadrant of left female breast: Secondary | ICD-10-CM

## 2020-07-07 NOTE — Progress Notes (Signed)
Radiation Oncology Follow up Note  Name: Brittney Johnson   Date:   07/07/2020 MRN:  [id number] DOB: 1955/08/10    This 64 y.o. female presents to the clinic today for 1 month follow-up status post whole breast radiation to her left breast for stage Ia ER/PR positive invasive mammary carcinoma with micropapillary features.  REFERRING PROVIDER: Abner Greenspan, MD  HPI: Patient is a 64 year old female now out 1 month having completed whole breast radiation to her left breast for stage Ia ER/PR positive invasive mammary carcinoma.  Seen today in routine follow-up she is doing well still has some firmness of the left breast from scarring nothing that unusual.  She otherwise specifically denies breast tenderness cough or bone pain.  She has been started on.  Arimidex tolerating it well without side effect.  COMPLICATIONS OF TREATMENT: none  FOLLOW UP COMPLIANCE: keeps appointments   PHYSICAL EXAM:  BP 129/69   Pulse 68   Temp (!) 97.3 F (36.3 C) (Tympanic)   Wt 157 lb 11.2 oz (71.5 kg)   LMP 07/30/2003   BMI 26.69 kg/m  Lungs are clear to A&P cardiac examination essentially unremarkable with regular rate and rhythm. No dominant mass or nodularity is noted in either breast in 2 positions examined. Incision is well-healed. No axillary or supraclavicular adenopathy is appreciated. Cosmetic result is excellent.  Well-developed well-nourished patient in NAD. HEENT reveals PERLA, EOMI, discs not visualized.  Oral cavity is clear. No oral mucosal lesions are identified. Neck is clear without evidence of cervical or supraclavicular adenopathy. Lungs are clear to A&P. Cardiac examination is essentially unremarkable with regular rate and rhythm without murmur rub or thrill. Abdomen is benign with no organomegaly or masses noted. Motor sensory and DTR levels are equal and symmetric in the upper and lower extremities. Cranial nerves II through XII are grossly intact. Proprioception is intact. No  peripheral adenopathy or edema is identified. No motor or sensory levels are noted. Crude visual fields are within normal range.  RADIOLOGY RESULTS: No current films to review  PLAN: Present time patient is doing well with no evidence of disease and healing nicely from her surgery and postoperative radiation.  I am pleased with her overall progress.  I asked to see her back in 4 to 5 months for follow-up.  She continues on Arimidex without side effect.  Patient knows to call with any concerns.  I would like to take this opportunity to thank you for allowing me to participate in the care of your patient.Noreene Filbert, MD

## 2020-08-10 ENCOUNTER — Telehealth: Payer: Self-pay | Admitting: *Deleted

## 2020-08-10 NOTE — Telephone Encounter (Signed)
Patient contacted and offered virtual visit in the cancer center on 08/16/20 due to the rise in covid cases within [facility]. She is agreeable and thanked me for changing her apt to virtual visit.

## 2020-08-16 ENCOUNTER — Other Ambulatory Visit: Payer: Self-pay

## 2020-08-16 ENCOUNTER — Inpatient Hospital Stay: Payer: BC Managed Care – PPO | Attending: Internal Medicine | Admitting: Internal Medicine

## 2020-08-16 DIAGNOSIS — M8580 Other specified disorders of bone density and structure, unspecified site: Secondary | ICD-10-CM | POA: Insufficient documentation

## 2020-08-16 DIAGNOSIS — C50212 Malignant neoplasm of upper-inner quadrant of left female breast: Secondary | ICD-10-CM | POA: Diagnosis not present

## 2020-08-16 DIAGNOSIS — Z170 Estrogen receptor positive status [ER+]: Secondary | ICD-10-CM

## 2020-08-16 DIAGNOSIS — Z79811 Long term (current) use of aromatase inhibitors: Secondary | ICD-10-CM | POA: Insufficient documentation

## 2020-08-16 NOTE — Progress Notes (Signed)
I connected with Shakhia C Collington on 08/16/20 at 10:00 AM EST by video enabled telemedicine visit and verified that I am speaking with the correct person using two identifiers.  I discussed the limitations, risks, security and privacy concerns of performing an evaluation and management service by telemedicine and the availability of in-person appointments. I also discussed with the patient that there may be a patient responsible charge related to this service. The patient expressed understanding and agreed to proceed.    Other persons participating in the visit and their role in the encounter: RN/medical reconciliation Patient's location: Home Provider's location: Office  Oncology History Overview Note  # LEFT BREAST CA- INVASIVE MAMMARY CARCINOMA, NO SPECIAL TYPE, WITH MICROPAPILLARY  FEATURES; ER/PR Positive [90%]; Her 2 NEG; G-1; 0.8 x 0.7 x 1 cm.  Oncotype recurrence score= 0; 3% distant recurrence; no chemotherapy; adjuvant radiation;  s/p RT [nov 5th, 2021];   # NOV end 221- START Anastrazole[previous HRT*]  # # SURVIVORSHIP:   # GENETICS: No pathogenic variants; VUS*  DIAGNOSIS: Cure  STAGE: 1        ;  GOALS: Cure  CURRENT/MOST RECENT THERAPY :     Carcinoma of upper-inner quadrant of left breast in female, estrogen receptor positive ([place])  03/01/2020 Initial Diagnosis   Carcinoma of upper-inner quadrant of left breast in female, estrogen receptor positive ([place])    Genetic Testing   Negative genetic testing. No pathogenic variants identified on the [facility] Multi-Cancer Panel. VUS in AIP called c.790_792del and VUS in RET called c.1163T>C identified. The report date is 04/07/2020.  The Multi-Cancer Panel offered by [facility] includes sequencing and/or deletion duplication testing of the following 85 genes: AIP, ALK, APC, ATM, AXIN2,BAP1,  BARD1, BLM, BMPR1A, BRCA1, BRCA2, BRIP1, CASR, CDC73, CDH1, CDK4, CDKN1B, CDKN1C, CDKN2A (p14ARF), CDKN2A (p16INK4a), CEBPA, CHEK2, CTNNA1,  DICER1, DIS3L2, EGFR (c.2369C>T, p.Thr790Met variant only), EPCAM (Deletion/duplication testing only), FH, FLCN, GATA2, GPC3, GREM1 (Promoter region deletion/duplication testing only), HOXB13 (c.251G>A, p.Gly84Glu), HRAS, KIT, MAX, MEN1, MET, MITF (c.952G>A, p.Glu318Lys variant only), MLH1, MSH2, MSH3, MSH6, MUTYH, NBN, NF1, NF2, NTHL1, PALB2, PDGFRA, PHOX2B, PMS2, POLD1, POLE, POT1, PRKAR1A, PTCH1, PTEN, RAD50, RAD51C, RAD51D, RB1, RECQL4, RET, RNF43, RUNX1, SDHAF2, SDHA (sequence changes only), SDHB, SDHC, SDHD, SMAD4, SMARCA4, SMARCB1, SMARCE1, STK11, SUFU, TERC, TERT, TMEM127, TP53, TSC1, TSC2, VHL, WRN and WT1.       Chief Complaint:  Breast cancer   History of present illness:Brittney Johnson 65 y.o.  female with history of stage I ER/PR positive HER2 negative breast cancer currently on adjuvant anastrozole is here for follow-up.  Patient denies any new lumps or bumps.  Complains of mild soreness at the site of surgery on her breast.  Otherwise denies any worsening hot flashes.  Denies any joint pains.  Denies any leg swelling.  Observation/objective:  Assessment and plan: Carcinoma of upper-inner quadrant of left breast in female, estrogen receptor positive ([place]) #Left breast cancer stage I ER/PR positive HER-2 negative; s/p lumpectomy; Oncotype recurrence- LOW RISK; s/p RT [nov 5th, 2021]; no adjuvant chemotherapy.  Mammogram August 2022-Dr. Cintron  #On anastrozole tolerating well.  No major side effects noted.  Continue anastrozole for 5 years.  # BMD OCT 2020- T score+ -1.4-osteopenia; on ca+vit D>   # DISPOSITION:mychart  # Follow-up in 4 months-MD no labs.  Dr. Jacinto Reap..  Follow-up instructions:  I discussed the assessment and treatment plan with the patient.  The patient was provided an opportunity to ask questions and all were answered.  The patient  agreed with the plan and demonstrated understanding of instructions.  The patient was advised to call back or seek an in person  evaluation if the symptoms worsen or if the condition fails to improve as anticipated.  Dr. Charlaine Dalton [place] at [facility] 08/16/2020 9:58 AM

## 2020-08-16 NOTE — Assessment & Plan Note (Addendum)
#  Left breast cancer stage I ER/PR positive HER-2 negative; s/p lumpectomy; Oncotype recurrence- LOW RISK; s/p RT [nov 5th, 2021]; no adjuvant chemotherapy.  Mammogram August 2022-Dr. Cintron  #On anastrozole tolerating well.  No major side effects noted.  Continue anastrozole for 5 years.  # BMD OCT 2020- T score+ -1.4-osteopenia; on ca+vit D>   # DISPOSITION:mychart  # Follow-up in 4 months-MD no labs.  Dr. Jacinto Reap..

## 2020-08-28 ENCOUNTER — Ambulatory Visit (AMBULATORY_SURGERY_CENTER): Payer: Self-pay | Admitting: *Deleted

## 2020-08-28 ENCOUNTER — Other Ambulatory Visit: Payer: Self-pay

## 2020-08-28 VITALS — Ht 64.5 in | Wt 158.0 lb

## 2020-08-28 DIAGNOSIS — Z86010 Personal history of colonic polyps: Secondary | ICD-10-CM

## 2020-08-28 MED ORDER — SUTAB 1479-225-188 MG PO TABS: 1.0000 | ORAL_TABLET | ORAL | 0 refills | Status: DC

## 2020-08-28 NOTE — Progress Notes (Signed)
Patient is here in-person for PV. Patient denies any allergies to eggs or soy. Patient denies any problems with anesthesia/sedation. PONV.  Patient denies any oxygen use at home. Patient denies taking any diet/weight loss medications or blood thinners. Patient is not being treated for MRSA or C-diff. Patient is aware of our care-partner policy and EHOZY-24 safety protocol. EMMI education assigned to the patient for the procedure, sent to [place].   COVID-19 vaccines completed on 07/10/2020 x3, per patient.   Prep Prescription coupon given to the patient.

## 2020-09-11 ENCOUNTER — Encounter: Payer: Self-pay | Admitting: Internal Medicine

## 2020-09-11 ENCOUNTER — Other Ambulatory Visit: Payer: Self-pay

## 2020-09-11 ENCOUNTER — Ambulatory Visit (AMBULATORY_SURGERY_CENTER): Payer: BC Managed Care – PPO | Admitting: Internal Medicine

## 2020-09-11 VITALS — BP 124/68 | HR 79 | Temp 98.9°F | Resp 14 | Ht 64.0 in | Wt 158.0 lb

## 2020-09-11 DIAGNOSIS — Z1211 Encounter for screening for malignant neoplasm of colon: Secondary | ICD-10-CM | POA: Diagnosis not present

## 2020-09-11 DIAGNOSIS — Z860100 Personal history of colon polyps, unspecified: Secondary | ICD-10-CM

## 2020-09-11 DIAGNOSIS — D124 Benign neoplasm of descending colon: Secondary | ICD-10-CM | POA: Diagnosis not present

## 2020-09-11 DIAGNOSIS — D123 Benign neoplasm of transverse colon: Secondary | ICD-10-CM | POA: Diagnosis not present

## 2020-09-11 DIAGNOSIS — D122 Benign neoplasm of ascending colon: Secondary | ICD-10-CM | POA: Diagnosis not present

## 2020-09-11 MED ORDER — SODIUM CHLORIDE 0.9 % IV SOLN: 500.0000 mL | Freq: Once | INTRAVENOUS | Status: DC

## 2020-09-11 NOTE — Progress Notes (Signed)
Report given to PACU, vss 

## 2020-09-11 NOTE — Patient Instructions (Signed)
YOU HAD AN ENDOSCOPIC PROCEDURE TODAY AT THE [facility]:   Refer to the procedure report that was given to you for any specific questions about what was found during the examination.  If the procedure report does not answer your questions, please call your gastroenterologist to clarify.  If you requested that your care partner not be given the details of your procedure findings, then the procedure report has been included in a sealed envelope for you to review at your convenience later.  YOU SHOULD EXPECT: Some feelings of bloating in the abdomen. Passage of more gas than usual.  Walking can help get rid of the air that was put into your GI tract during the procedure and reduce the bloating. If you had a lower endoscopy (such as a colonoscopy or flexible sigmoidoscopy) you may notice spotting of blood in your stool or on the toilet paper. If you underwent a bowel prep for your procedure, you may not have a normal bowel movement for a few days.  Please Note:  You might notice some irritation and congestion in your nose or some drainage.  This is from the oxygen used during your procedure.  There is no need for concern and it should clear up in a day or so.  SYMPTOMS TO REPORT IMMEDIATELY:   Following lower endoscopy (colonoscopy or flexible sigmoidoscopy):  Excessive amounts of blood in the stool  Significant tenderness or worsening of abdominal pains  Swelling of the abdomen that is new, acute  Fever of 100F or higher  For urgent or emergent issues, a gastroenterologist can be reached at any hour by calling [phone]. Do not use MyChart messaging for urgent concerns.    DIET:  We do recommend a small meal at first, but then you may proceed to your regular diet.  Drink plenty of fluids but you should avoid alcoholic beverages for 24 hours.  ACTIVITY:  You should plan to take it easy for the rest of today and you should NOT DRIVE or use heavy machinery until tomorrow (because  of the sedation medicines used during the test).    FOLLOW UP: Our staff will call the number listed on your records 48-72 hours following your procedure to check on you and address any questions or concerns that you may have regarding the information given to you following your procedure. If we do not reach you, we will leave a message.  We will attempt to reach you two times.  During this call, we will ask if you have developed any symptoms of COVID 19. If you develop any symptoms (ie: fever, flu-like symptoms, shortness of breath, cough etc.) before then, please call [phone].  If you test positive for Covid 19 in the 2 weeks post procedure, please call and report this information to us.    If any biopsies were taken you will be contacted by phone or by letter within the next 1-3 weeks.  Please call us at [phone] if you have not heard about the biopsies in 3 weeks.    SIGNATURES/CONFIDENTIALITY: You and/or your care partner have signed paperwork which will be entered into your electronic medical record.  These signatures attest to the fact that that the information above on your After Visit Summary has been reviewed and is understood.  Full responsibility of the confidentiality of this discharge information lies with you and/or your care-partner. 

## 2020-09-11 NOTE — Progress Notes (Signed)
M.O. vital signs.

## 2020-09-11 NOTE — Op Note (Signed)
Brittney Johnson Patient Name: Brittney Johnson Procedure Date: 09/11/2020 8:58 AM MRN: [id number] Endoscopist: Jerene Bears , MD Age: 65 Referring MD:  Date of Birth: 09/28/1955 Gender: Female Account #: [id number] Procedure:                Colonoscopy Indications:              High risk colon cancer surveillance: Personal                            history of adenoma (10 mm or greater in size) and                            sessile serrated polyp at last colonoscopy: January                            2018 Medicines:                Monitored Anesthesia Care Procedure:                Pre-Anesthesia Assessment:                           - Prior to the procedure, a History and Physical                            was performed, and patient medications and                            allergies were reviewed. The patient's tolerance of                            previous anesthesia was also reviewed. The risks                            and benefits of the procedure and the sedation                            options and risks were discussed with the patient.                            All questions were answered, and informed consent                            was obtained. Prior Anticoagulants: The patient has                            taken no previous anticoagulant or antiplatelet                            agents. ASA Grade Assessment: II - A patient with                            mild systemic disease. After reviewing the risks  and benefits, the patient was deemed in                            satisfactory condition to undergo the procedure.                           After obtaining informed consent, the colonoscope                            was passed under direct vision. Throughout the                            procedure, the patient's blood pressure, pulse, and                            oxygen saturations were monitored continuously. The                             Olympus PFC-H190DL (#[id number]) Colonoscope was                            introduced through the anus and advanced to the                            cecum, identified by appendiceal orifice and                            ileocecal valve. The colonoscopy was performed                            without difficulty. The patient tolerated the                            procedure well. The quality of the bowel                            preparation was good. The ileocecal valve,                            appendiceal orifice, and rectum were photographed. Scope In: 9:05:18 AM Scope Out: 9:25:02 AM Scope Withdrawal Time: 0 hours 16 minutes 25 seconds  Total Procedure Duration: 0 hours 19 minutes 44 seconds  Findings:                 Hemorrhoids were found on perianal exam.                           Two sessile polyps were found in the ascending                            colon. The polyps were 5 to 6 mm in size. These                            polyps were removed with a cold snare. Resection  and retrieval were complete.                           A 5 mm polyp was found in the hepatic flexure. The                            polyp was sessile. The polyp was removed with a                            cold snare. Resection and retrieval were complete.                           A 6 mm polyp was found in the descending colon. The                            polyp was sessile. The polyp was removed with a                            cold snare. Resection and retrieval were complete.                           Multiple small and large-mouthed diverticula were                            found in the sigmoid colon.                           Internal hemorrhoids were found during retroflexion                            and during digital exam. The hemorrhoids were                            medium-sized and Grade III (internal hemorrhoids                             that prolapse but require manual reduction). Complications:            No immediate complications. Estimated Blood Loss:     Estimated blood loss was minimal. Impression:               - Two 5 to 6 mm polyps in the ascending colon,                            removed with a cold snare. Resected and retrieved.                           - One 5 mm polyp at the hepatic flexure, removed                            with a cold snare. Resected and retrieved.                           -  One 6 mm polyp in the descending colon, removed                            with a cold snare. Resected and retrieved.                           - Diverticulosis in the sigmoid colon.                           - Internal hemorrhoids. Recommendation:           - Patient has a contact number available for                            emergencies. The signs and symptoms of potential                            delayed complications were discussed with the                            patient. Return to normal activities tomorrow.                            Written discharge instructions were provided to the                            patient.                           - Resume previous diet.                           - Continue present medications.                           - Await pathology results.                           - Repeat colonoscopy is recommended for                            surveillance. The colonoscopy date will be                            determined after pathology results from today's                            exam become available for review. Jerene Bears, MD 09/11/2020 9:30:08 AM This report has been signed electronically.

## 2020-09-11 NOTE — Progress Notes (Signed)
0915 Robinul 0.2 mg IV given due large amount of secretions upon assessment.  Patient experiencing nausea and vomiting.  MD updated and Zofran 4 mg IV given, vss

## 2020-09-11 NOTE — Progress Notes (Signed)
Called to room to assist during endoscopic procedure.  Patient ID and intended procedure confirmed with present staff. Received instructions for my participation in the procedure from the performing physician.  

## 2020-09-13 ENCOUNTER — Telehealth: Payer: Self-pay

## 2020-09-13 ENCOUNTER — Telehealth: Payer: Self-pay | Admitting: *Deleted

## 2020-09-13 NOTE — Telephone Encounter (Signed)
LVM

## 2020-09-13 NOTE — Telephone Encounter (Signed)
Attempted f/u phone call. No answer. Left message. °

## 2020-09-14 ENCOUNTER — Encounter: Payer: Self-pay | Admitting: Internal Medicine

## 2020-10-05 ENCOUNTER — Encounter: Payer: Self-pay | Admitting: Radiation Oncology

## 2020-10-05 ENCOUNTER — Ambulatory Visit
Admission: RE | Admit: 2020-10-05 | Discharge: 2020-10-05 | Disposition: A | Discharge status: Home / Self Care | Payer: BC Managed Care – PPO | Source: Ambulatory Visit | Attending: Radiation Oncology | Admitting: Radiation Oncology

## 2020-10-05 VITALS — BP 141/74 | HR 65 | Temp 97.1°F | Wt 157.0 lb

## 2020-10-05 DIAGNOSIS — C50212 Malignant neoplasm of upper-inner quadrant of left female breast: Secondary | ICD-10-CM

## 2020-10-05 DIAGNOSIS — Z08 Encounter for follow-up examination after completed treatment for malignant neoplasm: Secondary | ICD-10-CM | POA: Diagnosis not present

## 2020-10-05 DIAGNOSIS — Z79811 Long term (current) use of aromatase inhibitors: Secondary | ICD-10-CM | POA: Diagnosis not present

## 2020-10-05 DIAGNOSIS — Z923 Personal history of irradiation: Secondary | ICD-10-CM | POA: Insufficient documentation

## 2020-10-05 DIAGNOSIS — Z170 Estrogen receptor positive status [ER+]: Secondary | ICD-10-CM | POA: Diagnosis not present

## 2020-10-05 NOTE — Progress Notes (Signed)
Radiation Oncology Follow up Note  Name: Brittney Johnson   Date:   10/05/2020 MRN:  [id number] DOB: 1956/03/21    This 65 y.o. female presents to the clinic today for 43-month follow-up status post whole breast radiation to her left breast for stage Ia ER/PR positive invasive mammary carcinoma with micropapillary features.  REFERRING PROVIDER: Tower, Wynelle Fanny, MD  HPI: Patient is a 65 year old female now out 4 months having completed whole breast radiation to her left breast for stage Ia ER/PR positive invasive mammary carcinoma.  Seen today in routine follow-up she is doing well she still feels her breast is somewhat firmer a little bit lumpy.  She specifically denies breast tenderness cough or bone pain..  She is currently on Arimidex tolerating it well without side effect.  COMPLICATIONS OF TREATMENT: none  FOLLOW UP COMPLIANCE: keeps appointments   PHYSICAL EXAM:  BP (!) 141/74   Pulse 65   Temp (!) 97.1 F (36.2 C) (Tympanic)   Wt 157 lb (71.2 kg)   LMP 07/30/2003   BMI 26.95 kg/m  Lungs are clear to A&P cardiac examination essentially unremarkable with regular rate and rhythm. No dominant mass or nodularity is noted in either breast in 2 positions examined. Incision is well-healed. No axillary or supraclavicular adenopathy is appreciated. Cosmetic result is excellent.  Breast is somewhat firmer I explained to her that is secondary to radiation effects on the breast eradicating some of the ductal tissue.  Well-developed well-nourished patient in NAD. HEENT reveals PERLA, EOMI, discs not visualized.  Oral cavity is clear. No oral mucosal lesions are identified. Neck is clear without evidence of cervical or supraclavicular adenopathy. Lungs are clear to A&P. Cardiac examination is essentially unremarkable with regular rate and rhythm without murmur rub or thrill. Abdomen is benign with no organomegaly or masses noted. Motor sensory and DTR levels are equal and symmetric in the upper and  lower extremities. Cranial nerves II through XII are grossly intact. Proprioception is intact. No peripheral adenopathy or edema is identified. No motor or sensory levels are noted. Crude visual fields are within normal range.  RADIOLOGY RESULTS: No current films for review  PLAN: Present time patient is doing well 4 months out with no evidence of disease.  She has an excellent cosmetic result.  I am pleased with her overall progress of asked to see her at 6 months for follow-up.  She continues on Arimidex without side effect.  Patient knows to call with any concerns.  I would like to take this opportunity to thank you for allowing me to participate in the care of your patient.Noreene Filbert, MD

## 2020-10-18 ENCOUNTER — Inpatient Hospital Stay: Payer: BC Managed Care – PPO | Attending: Internal Medicine | Admitting: Occupational Therapy

## 2020-10-18 DIAGNOSIS — L905 Scar conditions and fibrosis of skin: Secondary | ICD-10-CM

## 2020-10-18 NOTE — Therapy (Signed)
[facility] [place], [place], [place], [address] Phone: [phone]   Fax:  [phone]  Occupational Therapy Screen:  Patient Details  Name: Brittney Johnson MRN: [id number] Date of Birth: 07-24-1956 No data recorded  Encounter Date: 10/18/2020     NOTE 10/05/20 DR CRYSTAL: This 65 y.o. female presents to the clinic today for 66-month follow-up status post whole breast radiation to her left breast for stage Ia ER/PR positive invasive mammary carcinoma with micropapillary features.  REFERRING PROVIDER: Tower, Wynelle Fanny, MD  HPI: Patient is a 65 year old female now out 4 months having completed whole breast radiation to her left breast for stage Ia ER/PR positive invasive mammary carcinoma.  Seen today in routine follow-up she is doing well she still feels her breast is somewhat firmer a little bit lumpy.  She specifically denies breast tenderness cough or bone pain..  She is currently on Arimidex tolerating it well without side effect    OT End of Session - 10/18/20 1836    Visit Number 0           Past Medical History:  Diagnosis Date  . Atypical pneumonia 1998   not hosp  . Breast cancer ([place]) 2021   left-sx, radation completed  . DDD (degenerative disc disease), cervical   . Family history of breast cancer   . Family history of lung cancer   . Hyperlipidemia   . Hypertension   . Low back pain    on and off  . PONV (postoperative nausea and vomiting)    due to some sedations!  . Syncope and collapse 07/16/2007    Past Surgical History:  Procedure Laterality Date  . ABDOMINAL HYSTERECTOMY     supracervical- L ovary remains  . BREAST BIOPSY Left 02/22/2020   Q clip [place] bx, [facility]  . CARDIOVASCULAR STRESS TEST     08/28/06 normal   . COLONOSCOPY  08/13/2016   Pyrtle  . left hand surgery Left 2016   Dupuytrens  . LUMBAR DISC SURGERY  08/2017   Saintclair Halsted  . OOPHORECTOMY Right    Right ovary removed    . PARTIAL MASTECTOMY WITH NEEDLE LOCALIZATION AND AXILLARY SENTINEL LYMPH NODE BX Left 03/15/2020   Procedure: PARTIAL MASTECTOMY WITH RF TAG PLACEMENT AND AXILLARY SENTINEL LYMPH NODE BX;  Surgeon: Herbert Pun, MD;  Location: [facility] ORS;  Service: General;  Laterality: Left;  . POLYPECTOMY    . TUBAL LIGATION     ? 1979    There were no vitals filed for this visit.   Subjective Assessment - 10/18/20 1833    Subjective  Was send for some education for lymphedema ,and then also my L breast still sore and tender - cannot sleep on stomach or hugging hurts    Currently in Pain? Yes    Pain Score 6     Pain Location Breast    Pain Orientation Left    Pain Descriptors / Indicators Aching;Tender    Pain Type Acute pain;Surgical pain    Pain Onset More than a month ago    Pain Frequency Intermittent    Aggravating Factors  touching , laying on stomachm pressure, hugging               LYMPHEDEMA/ONCOLOGY QUESTIONNAIRE - 10/18/20 0001      Right Upper Extremity Lymphedema   15 cm Proximal to Olecranon Process 29 cm    Olecranon Process 25 cm    15 cm Proximal  to Ulnar Styloid Process 23.4 cm    10 cm Proximal to Ulnar Styloid Process 2 cm    Just Proximal to Ulnar Styloid Process 15.3 cm    Across Hand at [employer] 18.4 cm      Left Upper Extremity Lymphedema   15 cm Proximal to Olecranon Process 28 cm    Olecranon Process 24.2 cm    15 cm Proximal to Ulnar Styloid Process 23 cm    10 cm Proximal to Ulnar Styloid Process 19.5 cm    Just Proximal to Ulnar Styloid Process 15 cm    Across Hand at [employer] 18.2 cm          Pt refer by Survivorship - OT SCREEN TODAY:  Education done for lymphedema - hand out provided and review  Pt do not have any symptoms or signs - and pt risk on lower side because of lumpectomy only with 5 ln removed  But did had full breast radiation. 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  Has dog but do not pull- and do not use  leash  She likes to walk and do stationary bike AROM bilateral shoulders are WNL  L breast scar tissue at 12 -painfull and adhesions 6/10  And some fibrosis on 6 to 9 o'clock- tender and 6/10  Pt ed on soft tissue mobs and scar massage to do for few wks and follow up   Pt do present with bilateral hands Dupuytren's - R 5th digit PIP at 90 degrees flex  And L in palm ulnar side and webspace Pt report they are painful Ask about surgeons and recovery afterwards  Info provided of few surgeons that does surgery and rehab course                            Patient will benefit from skilled therapeutic intervention in order to improve the following deficits and impairments:           Visit Diagnosis: Scar condition and fibrosis of skin    Problem List Patient Active Problem List   Diagnosis Date Noted  . Genetic testing 04/10/2020  . Family history of breast cancer   . Family history of lung cancer   . Carcinoma of upper-inner quadrant of left breast in female, estrogen receptor positive ([place]) 03/01/2020  . Osteopenia 05/15/2019  . Estrogen deficiency 04/19/2019  . Nocturnal leg cramps 04/13/2018  . Colon cancer screening 04/09/2016  . Vitamin D deficiency 04/09/2016  . GERD (gastroesophageal reflux disease) 04/09/2016  . Enlarged thyroid 12/05/2015  . Contracture of palmar fascia 07/10/2015  . Encounter for routine gynecological examination 02/20/2015  . Near syncope 08/05/2014  . Abnormal EKG 08/05/2014  . Anxiety 08/05/2014  . H/O cold sores 06/06/2014  . Dupuytren's contracture of right hand 02/08/2014  . Stress reaction 03/09/2013  . Routine general medical examination at a health care facility 12/05/2010  . Other screening mammogram 12/05/2010  . Raynaud disease 12/05/2010  . Hyperlipidemia 06/29/2010  . HYPERTENSION, BENIGN ESSENTIAL 06/29/2010  . Lumbar back pain with radiculopathy affecting right lower extremity 06/29/2010    Rosalyn Gess OTR/L,CLT 10/18/2020, 6:37 PM  [facility] [address] [place], [place], [place], [address] Phone: [phone]   Fax:  [phone]  Name: LIORA MYLES MRN: [id number] Date of Birth: [birth year]

## 2020-11-14 ENCOUNTER — Other Ambulatory Visit: Payer: Self-pay | Admitting: Internal Medicine

## 2020-11-22 ENCOUNTER — Inpatient Hospital Stay: Payer: BC Managed Care – PPO | Attending: Internal Medicine | Admitting: Occupational Therapy

## 2020-11-22 DIAGNOSIS — L905 Scar conditions and fibrosis of skin: Secondary | ICD-10-CM

## 2020-11-22 NOTE — Therapy (Signed)
[facility] [address] [place], [place], [place], [address] Phone: [phone]   Fax:  [phone]  Occupational Therapy Screen  Patient Details  Name: Brittney Johnson MRN: [id number] Date of Birth: [birth year] No data recorded  Encounter Date: 11/22/2020   OT End of Session - 11/22/20 1736    Visit Number 0           Past Medical History:  Diagnosis Date  . Atypical pneumonia 1998   not hosp  . Breast cancer ([place]) 2021   left-sx, radation completed  . DDD (degenerative disc disease), cervical   . Family history of breast cancer   . Family history of lung cancer   . Hyperlipidemia   . Hypertension   . Low back pain    on and off  . PONV (postoperative nausea and vomiting)    due to some sedations!  . Syncope and collapse 07/16/2007    Past Surgical History:  Procedure Laterality Date  . ABDOMINAL HYSTERECTOMY     supracervical- L ovary remains  . BREAST BIOPSY Left 02/22/2020   Q clip [place] bx, [facility]  . CARDIOVASCULAR STRESS TEST     08/28/06 normal   . COLONOSCOPY  08/13/2016   Pyrtle  . left hand surgery Left 2016   Dupuytrens  . LUMBAR DISC SURGERY  08/2017   Saintclair Halsted  . OOPHORECTOMY Right    Right ovary removed   . PARTIAL MASTECTOMY WITH NEEDLE LOCALIZATION AND AXILLARY SENTINEL LYMPH NODE BX Left 03/15/2020   Procedure: PARTIAL MASTECTOMY WITH RF TAG PLACEMENT AND AXILLARY SENTINEL LYMPH NODE BX;  Surgeon: Herbert Pun, MD;  Location: [facility] ORS;  Service: General;  Laterality: Left;  . POLYPECTOMY    . TUBAL LIGATION     ? 1979    There were no vitals filed for this visit.   Subjective Assessment - 11/22/20 1735    Subjective  My breast not as hard and lumpy as before- doing the massage - not every night - but pain is better- now at the most about 2-3/20 and able to sleep on side               Pt refer by Survivorship - OT SCREEN 10/18/20:  Education done for lymphedema - hand out  provided and review  Pt do not have any symptoms or signs - and pt risk on lower side because of lumpectomy only with 5 ln removed  But did had full breast radiation. 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  Has dog but do not pull- and do not use leash  She likes to walk and do stationary bike AROM bilateral shoulders are WNL  L breast scar tissue at 12 -painfull and adhesions 6/10  And some fibrosis on 6 to 9 o'clock- tender and 6/10  Pt ed on soft tissue mobs and scar massage to do for few wks and follow up   Pt do present with bilateral hands Dupuytren's - R 5th digit PIP at 90 degrees flex  And L in palm ulnar side and webspace Pt report they are painful Ask about surgeons and recovery afterwards  Info provided of few surgeons that does surgery and rehab course   SCREEN TODAY 11/22/20:  Education done for lymphedema last visit- hand out provided and review  Pt do not have any symptoms or signs - and pt risk on lower side because of lumpectomy only with 5 ln removed  But did  had full breast radiation. 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  Has dog but do not pull- and do not use leash  She likes to walk and do stationary bike AROM bilateral shoulders are WNL  L breast scar tissue at 12 - was last time painfull and adhesions 6/10  - but now 2/10 And some fibrosis on 6 to 9 o'clock- tender and 6/10 - but now less than 2/10 and very little fibrosis Pt ed on soft tissue mobs and scar massage again - done mini massager with great success and recommended for pt to get to use at home - 1 x day  Can do manual massage on scar in axilla too - bother her with ABD over head  Follow up in month with me again if needed    Pt do present with bilateral hands Dupuytren's - R 5th digit PIP at 90 degrees flex  And L in palm ulnar side and webspace Pt report they are painful SHe is scheduled to see Dr Amedeo Plenty in June                              Patient  will benefit from skilled therapeutic intervention in order to improve the following deficits and impairments:           Visit Diagnosis: Scar condition and fibrosis of skin    Problem List Patient Active Problem List   Diagnosis Date Noted  . Genetic testing 04/10/2020  . Family history of breast cancer   . Family history of lung cancer   . Carcinoma of upper-inner quadrant of left breast in female, estrogen receptor positive ([place]) 03/01/2020  . Osteopenia 05/15/2019  . Estrogen deficiency 04/19/2019  . Nocturnal leg cramps 04/13/2018  . Colon cancer screening 04/09/2016  . Vitamin D deficiency 04/09/2016  . GERD (gastroesophageal reflux disease) 04/09/2016  . Enlarged thyroid 12/05/2015  . Contracture of palmar fascia 07/10/2015  . Encounter for routine gynecological examination 02/20/2015  . Near syncope 08/05/2014  . Abnormal EKG 08/05/2014  . Anxiety 08/05/2014  . H/O cold sores 06/06/2014  . Dupuytren's contracture of right hand 02/08/2014  . Stress reaction 03/09/2013  . Routine general medical examination at a health care facility 12/05/2010  . Other screening mammogram 12/05/2010  . Raynaud disease 12/05/2010  . Hyperlipidemia 06/29/2010  . HYPERTENSION, BENIGN ESSENTIAL 06/29/2010  . Lumbar back pain with radiculopathy affecting right lower extremity 06/29/2010    Rosalyn Gess OTR/L,CLT 11/22/2020, 5:36 PM  [facility] [address] [place], [place], [place], [address] Phone: [phone]   Fax:  [phone]  Name: Brittney Johnson MRN: [id number] Date of Birth: 11/06/1955

## 2020-12-13 ENCOUNTER — Inpatient Hospital Stay: Payer: BC Managed Care – PPO | Admitting: Internal Medicine

## 2020-12-27 ENCOUNTER — Ambulatory Visit: Payer: BC Managed Care – PPO | Admitting: Occupational Therapy

## 2021-01-01 ENCOUNTER — Ambulatory Visit: Payer: BC Managed Care – PPO | Admitting: Internal Medicine

## 2021-01-03 ENCOUNTER — Inpatient Hospital Stay (HOSPITAL_BASED_OUTPATIENT_CLINIC_OR_DEPARTMENT_OTHER): Payer: BC Managed Care – PPO | Admitting: Internal Medicine

## 2021-01-03 ENCOUNTER — Inpatient Hospital Stay: Payer: BC Managed Care – PPO | Attending: Internal Medicine | Admitting: Occupational Therapy

## 2021-01-03 ENCOUNTER — Encounter: Payer: Self-pay | Admitting: Internal Medicine

## 2021-01-03 DIAGNOSIS — Z87891 Personal history of nicotine dependence: Secondary | ICD-10-CM | POA: Insufficient documentation

## 2021-01-03 DIAGNOSIS — M8580 Other specified disorders of bone density and structure, unspecified site: Secondary | ICD-10-CM | POA: Insufficient documentation

## 2021-01-03 DIAGNOSIS — Z823 Family history of stroke: Secondary | ICD-10-CM | POA: Diagnosis not present

## 2021-01-03 DIAGNOSIS — Z8249 Family history of ischemic heart disease and other diseases of the circulatory system: Secondary | ICD-10-CM | POA: Diagnosis not present

## 2021-01-03 DIAGNOSIS — Z91030 Bee allergy status: Secondary | ICD-10-CM | POA: Insufficient documentation

## 2021-01-03 DIAGNOSIS — Z8349 Family history of other endocrine, nutritional and metabolic diseases: Secondary | ICD-10-CM | POA: Insufficient documentation

## 2021-01-03 DIAGNOSIS — Z885 Allergy status to narcotic agent status: Secondary | ICD-10-CM | POA: Insufficient documentation

## 2021-01-03 DIAGNOSIS — Z803 Family history of malignant neoplasm of breast: Secondary | ICD-10-CM | POA: Insufficient documentation

## 2021-01-03 DIAGNOSIS — M720 Palmar fascial fibromatosis [Dupuytren]: Secondary | ICD-10-CM | POA: Diagnosis not present

## 2021-01-03 DIAGNOSIS — Z90721 Acquired absence of ovaries, unilateral: Secondary | ICD-10-CM | POA: Diagnosis not present

## 2021-01-03 DIAGNOSIS — C50212 Malignant neoplasm of upper-inner quadrant of left female breast: Secondary | ICD-10-CM | POA: Diagnosis not present

## 2021-01-03 DIAGNOSIS — Z809 Family history of malignant neoplasm, unspecified: Secondary | ICD-10-CM | POA: Diagnosis not present

## 2021-01-03 DIAGNOSIS — Z79899 Other long term (current) drug therapy: Secondary | ICD-10-CM | POA: Diagnosis not present

## 2021-01-03 DIAGNOSIS — Z170 Estrogen receptor positive status [ER+]: Secondary | ICD-10-CM | POA: Diagnosis not present

## 2021-01-03 DIAGNOSIS — Z801 Family history of malignant neoplasm of trachea, bronchus and lung: Secondary | ICD-10-CM | POA: Insufficient documentation

## 2021-01-03 DIAGNOSIS — Z79811 Long term (current) use of aromatase inhibitors: Secondary | ICD-10-CM | POA: Insufficient documentation

## 2021-01-03 DIAGNOSIS — Z833 Family history of diabetes mellitus: Secondary | ICD-10-CM | POA: Insufficient documentation

## 2021-01-03 DIAGNOSIS — M791 Myalgia, unspecified site: Secondary | ICD-10-CM | POA: Insufficient documentation

## 2021-01-03 DIAGNOSIS — M2550 Pain in unspecified joint: Secondary | ICD-10-CM | POA: Insufficient documentation

## 2021-01-03 DIAGNOSIS — L905 Scar conditions and fibrosis of skin: Secondary | ICD-10-CM

## 2021-01-03 NOTE — Progress Notes (Signed)
[facility] [place] NOTE  Patient Care Team: Tower, Wynelle Fanny, MD as PCP - General Noreene Filbert, MD as Referring Physician (Radiation Oncology) Rico Junker, RN as Oncology Nurse Navigator Herbert Pun, MD as Consulting Physician (General Surgery) Cammie Sickle, MD as Consulting Physician (Internal Medicine)  CHIEF COMPLAINTS/PURPOSE OF CONSULTATION: Breast cancer  #  Oncology History Overview Note  # LEFT BREAST CA- INVASIVE MAMMARY CARCINOMA, NO SPECIAL TYPE, WITH MICROPAPILLARY  FEATURES; ER/PR Positive [90%]; Her 2 NEG; G-1; 0.8 x 0.7 x 1 cm.  Oncotype recurrence score= 0; 3% distant recurrence; no chemotherapy; adjuvant radiation;  s/p RT [nov 5th, 2021];   # NOV end 221- START Anastrazole[previous HRT*]  # # SURVIVORSHIP:   # GENETICS: No pathogenic variants; VUS*  DIAGNOSIS: Cure  STAGE: 1        ;  GOALS: Cure  CURRENT/MOST RECENT THERAPY :     Carcinoma of upper-inner quadrant of left breast in female, estrogen receptor positive ([place])  03/01/2020 Initial Diagnosis   Carcinoma of upper-inner quadrant of left breast in female, estrogen receptor positive ([place])    Genetic Testing   Negative genetic testing. No pathogenic variants identified on the [facility] Multi-Cancer Panel. VUS in AIP called c.790_792del and VUS in RET called c.1163T>C identified. The report date is 04/07/2020.  The Multi-Cancer Panel offered by [facility] includes sequencing and/or deletion duplication testing of the following 85 genes: AIP, ALK, APC, ATM, AXIN2,BAP1,  BARD1, BLM, BMPR1A, BRCA1, BRCA2, BRIP1, CASR, CDC73, CDH1, CDK4, CDKN1B, CDKN1C, CDKN2A (p14ARF), CDKN2A (p16INK4a), CEBPA, CHEK2, CTNNA1, DICER1, DIS3L2, EGFR (c.2369C>T, p.Thr790Met variant only), EPCAM (Deletion/duplication testing only), FH, FLCN, GATA2, GPC3, GREM1 (Promoter region deletion/duplication testing only), HOXB13 (c.251G>A, p.Gly84Glu), HRAS, KIT, MAX, MEN1, MET, MITF (c.952G>A, p.Glu318Lys  variant only), MLH1, MSH2, MSH3, MSH6, MUTYH, NBN, NF1, NF2, NTHL1, PALB2, PDGFRA, PHOX2B, PMS2, POLD1, POLE, POT1, PRKAR1A, PTCH1, PTEN, RAD50, RAD51C, RAD51D, RB1, RECQL4, RET, RNF43, RUNX1, SDHAF2, SDHA (sequence changes only), SDHB, SDHC, SDHD, SMAD4, SMARCA4, SMARCB1, SMARCE1, STK11, SUFU, TERC, TERT, TMEM127, TP53, TSC1, TSC2, VHL, WRN and WT1.       HISTORY OF PRESENTING ILLNESS:  Evaline C Ertel 65 y.o.  female diagnosis of stage I ER/PR positive HER-2 negative breast cancer on anastrozole is here for follow-up.  Patient denies any worsening hot flashes.  Complains of mild joint pains/myalgias.  Otherwise denies any new lumps or bumps.  Denies any nausea vomiting sweating headaches.   Review of Systems  Constitutional:  Negative for chills, diaphoresis, fever, malaise/fatigue and weight loss.  HENT:  Negative for nosebleeds and sore throat.   Eyes:  Negative for double vision.  Respiratory:  Negative for cough, hemoptysis, sputum production, shortness of breath and wheezing.   Cardiovascular:  Negative for chest pain, palpitations, orthopnea and leg swelling.  Gastrointestinal:  Negative for abdominal pain, blood in stool, constipation, diarrhea, heartburn, melena, nausea and vomiting.  Genitourinary:  Negative for dysuria, frequency and urgency.  Musculoskeletal:  Negative for back pain and joint pain.  Skin: Negative.  Negative for itching and rash.  Neurological:  Negative for dizziness, tingling, focal weakness, weakness and headaches.  Endo/Heme/Allergies:  Does not bruise/bleed easily.  Psychiatric/Behavioral:  Negative for depression. The patient is not nervous/anxious and does not have insomnia.     MEDICAL HISTORY:  Past Medical History:  Diagnosis Date  . Atypical pneumonia 1998   not hosp  . Breast cancer ([place]) 2021   left-sx, radation completed  . DDD (degenerative disc disease), cervical   .  Family history of breast cancer   . Family history of lung cancer    . Hyperlipidemia   . Hypertension   . Low back pain    on and off  . PONV (postoperative nausea and vomiting)    due to some sedations!  . Syncope and collapse 07/16/2007    SURGICAL HISTORY: Past Surgical History:  Procedure Laterality Date  . ABDOMINAL HYSTERECTOMY     supracervical- L ovary remains  . BREAST BIOPSY Left 02/22/2020   Q clip [place] bx, [facility]  . CARDIOVASCULAR STRESS TEST     08/28/06 normal   . COLONOSCOPY  08/13/2016   Pyrtle  . left hand surgery Left 2016   Dupuytrens  . LUMBAR DISC SURGERY  08/2017   Saintclair Halsted  . OOPHORECTOMY Right    Right ovary removed   . PARTIAL MASTECTOMY WITH NEEDLE LOCALIZATION AND AXILLARY SENTINEL LYMPH NODE BX Left 03/15/2020   Procedure: PARTIAL MASTECTOMY WITH RF TAG PLACEMENT AND AXILLARY SENTINEL LYMPH NODE BX;  Surgeon: Herbert Pun, MD;  Location: [facility] ORS;  Service: General;  Laterality: Left;  . POLYPECTOMY    . TUBAL LIGATION     ? 1979    SOCIAL HISTORY: Social History   Socioeconomic History  . Marital status: Married    Spouse name: Not on file  . Number of children: Not on file  . Years of education: Not on file  . Highest education level: Not on file  Occupational History  . Occupation: Adult nurse: [employer]  Tobacco Use  . Smoking status: Former    Packs/day: 0.50    Years: 5.00    Pack years: 2.50    Types: Cigarettes    Quit date: 07/29/1997    Years since quitting: 23.4  . Smokeless tobacco: Never  Vaping Use  . Vaping Use: Never used  Substance and Sexual Activity  . Alcohol use: Yes    Alcohol/week: 1.0 standard drink    Types: 1 Glasses of wine per week  . Drug use: No  . Sexual activity: Yes  Other Topics Concern  . Not on file  Social History Narrative   Kids live close by, 3 grandkids- really enjoys, runs for exercise, ran a 5k March 2011; walks; quit 20 years; rare alcohol. Work for [employer]; lives in [place]. 2 boys- 78s.    Social Determinants  of Health   Financial Resource Strain: Not on file  Food Insecurity: Not on file  Transportation Needs: Not on file  Physical Activity: Not on file  Stress: Not on file  Social Connections: Not on file  Intimate Partner Violence: Not on file    FAMILY HISTORY: Family History  Problem Relation Age of Onset  . Lung cancer Father 27  . Hyperlipidemia Mother   . Diabetes Mother   . Hypertension Mother   . Stroke Mother   . Heart disease Other   . Lung cancer Other        uncle  . Diabetes Other        aunt  . Coronary artery disease Maternal Grandfather   . Breast cancer Maternal Aunt        dx under 73  . Lung cancer Maternal Uncle   . Lung cancer Paternal Uncle   . Cancer Cousin        unk types  . Cancer Cousin        unk types  . Thyroid disease Neg Hx   . Colon cancer  Neg Hx   . Colon polyps Neg Hx   . Esophageal cancer Neg Hx   . Rectal cancer Neg Hx   . Stomach cancer Neg Hx     ALLERGIES:  is allergic to codeine and bee venom.  MEDICATIONS:  Current Outpatient Medications  Medication Sig Dispense Refill  . amLODipine (NORVASC) 5 MG tablet Take 1 tablet (5 mg total) by mouth daily. 90 tablet 3  . anastrozole (ARIMIDEX) 1 MG tablet TAKE 1 TABLET DAILY 90 tablet 3  . aspirin 81 MG EC tablet Take 81 mg by mouth every Monday, Wednesday, and Friday.     Marland Kitchen atorvastatin (LIPITOR) 20 MG tablet Take 1 tablet (20 mg total) by mouth daily. 90 tablet 3  . Calcium Citrate-Vitamin D (CALCIUM + D PO) Take by mouth.    Marland Kitchen Lifitegrast (XIIDRA) 5 % SOLN Place 1 drop into both eyes at bedtime.     Marland Kitchen lisinopril (ZESTRIL) 10 MG tablet Take 1 tablet (10 mg total) by mouth daily. 90 tablet 3  . Multiple Vitamin (MULTIVITAMIN) tablet Take 1 tablet by mouth daily.    . Omega-3 Fatty Acids (FISH OIL) 1200 MG CAPS Take 1,200 mg by mouth 2 (two) times daily.     No current facility-administered medications for this visit.      Marland Kitchen  PHYSICAL EXAMINATION: ECOG PERFORMANCE STATUS: 0 -  Asymptomatic  Vitals:   01/03/21 0925  BP: 129/73  Pulse: 67  Resp: 16  Temp: 97.8 F (36.6 C)  SpO2: 96%   Filed Weights   01/03/21 0925  Weight: 158 lb (71.7 kg)    Physical Exam HENT:     Head: Normocephalic and atraumatic.     Mouth/Throat:     Pharynx: No oropharyngeal exudate.  Eyes:     Pupils: Pupils are equal, round, and reactive to light.  Cardiovascular:     Rate and Rhythm: Normal rate and regular rhythm.  Pulmonary:     Effort: Pulmonary effort is normal. No respiratory distress.     Breath sounds: Normal breath sounds. No wheezing.  Abdominal:     General: Bowel sounds are normal. There is no distension.     Palpations: Abdomen is soft. There is no mass.     Tenderness: no abdominal tenderness There is no guarding or rebound.  Musculoskeletal:        General: No tenderness. Normal range of motion.     Cervical back: Normal range of motion and neck supple.  Skin:    General: Skin is warm.  Neurological:     Mental Status: She is alert and oriented to person, place, and time.  Psychiatric:        Mood and Affect: Affect normal.   LABORATORY DATA:  I have reviewed the data as listed Lab Results  Component Value Date   WBC 4.2 05/25/2020   HGB 11.8 (L) 05/25/2020   HCT 35.4 (L) 05/25/2020   MCV 87.8 05/25/2020   PLT 195 05/25/2020   Recent Labs    03/13/20 0918 04/14/20 0835  NA 140 139  K 3.9 4.7  CL 103 104  CO2 27 30  GLUCOSE 91 92  BUN 13 13  CREATININE 0.59 0.73  CALCIUM 9.6 9.9  GFRNONAA >60  --   GFRAA >60  --   PROT  --  7.1  ALBUMIN  --  4.4  AST  --  17  ALT  --  17  ALKPHOS  --  52  BILITOT  --  0.5    RADIOGRAPHIC STUDIES: I have personally reviewed the radiological images as listed and agreed with the findings in the report. No results found.  ASSESSMENT & PLAN:   Carcinoma of upper-inner quadrant of left breast in female, estrogen receptor positive ([place]) #Left breast cancer stage I ER/PR positive HER-2 negative;  s/p lumpectomy; Oncotype recurrence- LOW RISK; s/p RT [nov 5th, 2021]; no adjuvant chemotherapy.  Mammogram August 2022-Dr. Cintron  #On anastrozole tolerating well.  No major side effects noted except for MSK [see below]Continue anastrozole for 5 years.  # Joint pains/MSK- G-1; recommend continue walking/biking.  # BMD OCT 2020- T score+ -1.4-osteopenia; on ca+vit D>   # DISPOSITION: # Follow-up in 6 months-MD; no labs; BMD prior-  Dr. Jacinto Reap..    Cammie Sickle, MD 01/14/2021 6:56 PM

## 2021-01-03 NOTE — Therapy (Signed)
[facility] [address] [place], [place], [place], [address] Phone: [phone]   Fax:  [phone]  Occupational Therapy [facility]  Patient Details  Name: Brittney Johnson MRN: [id number] Date of Birth: 07/27/1956 No data recorded  Encounter Date: 01/03/2021   OT End of Session - 01/03/21 1015    Visit Number 0           Past Medical History:  Diagnosis Date  . Atypical pneumonia 1998   not hosp  . Breast cancer ([place]) 2021   left-sx, radation completed  . DDD (degenerative disc disease), cervical   . Family history of breast cancer   . Family history of lung cancer   . Hyperlipidemia   . Hypertension   . Low back pain    on and off  . PONV (postoperative nausea and vomiting)    due to some sedations!  . Syncope and collapse 07/16/2007    Past Surgical History:  Procedure Laterality Date  . ABDOMINAL HYSTERECTOMY     supracervical- L ovary remains  . BREAST BIOPSY Left 02/22/2020   Q clip [place] bx, [facility]  . CARDIOVASCULAR STRESS TEST     08/28/06 normal   . COLONOSCOPY  08/13/2016   Pyrtle  . left hand surgery Left 2016   Dupuytrens  . LUMBAR DISC SURGERY  08/2017   Saintclair Halsted  . OOPHORECTOMY Right    Right ovary removed   . PARTIAL MASTECTOMY WITH NEEDLE LOCALIZATION AND AXILLARY SENTINEL LYMPH NODE BX Left 03/15/2020   Procedure: PARTIAL MASTECTOMY WITH RF TAG PLACEMENT AND AXILLARY SENTINEL LYMPH NODE BX;  Surgeon: Herbert Pun, MD;  Location: [facility] ORS;  Service: General;  Laterality: Left;  . POLYPECTOMY    . TUBAL LIGATION     ? 1979    There were no vitals filed for this visit.   Subjective Assessment - 01/03/21 1014    Subjective  Doing much better- L breast doing well - I can sleep on my stomach and side- and no heaviness or fullness in breast, under arm and arm - did do the massages with the little massager - top scar still little thick but not painfull or tender - I have appt with DR Amedeo Plenty for  my hand today              Pt refer by Survivorship - OT SCREEN 10/18/20: Education done for lymphedema - hand out provided and review  Pt do not have any symptoms or signs - and pt risk on lower side because of lumpectomy only with 5 ln removed  But did had full breast radiation. 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  Has dog but do not pull- and do not use leash  She likes to walk and do stationary bike AROM bilateral shoulders are WNL L breast scar tissue at 12 -painfull and adhesions 6/10  And some fibrosis on 6 to 9 o'clock- tender and 6/10  Pt ed on soft tissue mobs and scar massage to do for few wks and follow up  Pt do present with bilateral hands Dupuytren's - R 5th digit PIP at 90 degrees flex  And L in palm ulnar side and webspace Pt report they are painful Ask about surgeons and recovery afterwards  Info provided of few surgeons that does surgery and rehab course   OT SCREEN  11/22/20:  Education done for lymphedema last visit- hand out provided and review  Pt do not have  any symptoms or signs - and pt risk on lower side because of lumpectomy only with 5 ln removed  But did had full breast radiation. 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  Has dog but do not pull- and do not use leash  She likes to walk and do stationary bike AROM bilateral shoulders are WNL L breast scar tissue at 12 - was last time painfull and adhesions 6/10  - but now 2/10 And some fibrosis on 6 to 9 o'clock- tender and 6/10 - but now less than 2/10 and very little fibrosis Pt ed on soft tissue mobs and scar massage again - done mini massager with great success and recommended for pt to get to use at home - 1 x day  Can do manual massage on scar in axilla too - bother her with ABD over head  Follow up in month with me again if needed   Pt do present with bilateral hands Dupuytren's - R 5th digit PIP at 90 degrees flex  And L in palm ulnar side and webspace Pt report  they are painful SHe is scheduled to see Dr Amedeo Plenty in June    OT [place] 01/03/21:    Education done for lymphedema at fist visit - hand out provided and review  Pt cont to denies any symptoms or signs - and pt risk on lower side because of lumpectomy only with 5 ln removed  She do report she is maybe flying to [place] next week and wants to compression sleeve Discuss with Dr Rogue Bussing nurse for prescription for pt to get over the counter sleeve and glove to wear for flying  But did had full breast radiation.  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  Has dog but do not pull- and do not use leash  She likes to walk and do stationary bike AROM bilateral shoulders are WNL L breast scar tissue at 12 still some what thick - and can cont with some mini massager but very little tenderness - less than 2/10 And no fibrosis on 6 to 9 o'clock anymore- made pt aware to keep eye on it and be preventative on that - no fibrosis or tenderness  Do not have to follow up with me -except if need to or changes   Pt do present with bilateral hands Dupuytren's - R 5th digit PIP at 90 degrees flex  And getting worse and turning into palm  And L in palm ulnar side and webspace Pt report they are painful and starting to have some pain in R thumb - over 1st dorsal compartment and A1pulley- report had some clicking the other day Pt to use palm as much - she is compensating and using lat grip more because of Dupuytren contracture SHe is scheduled to see Dr Amedeo Plenty for hands this afternoon                                        Visit Diagnosis: Scar condition and fibrosis of skin    Problem List Patient Active Problem List   Diagnosis Date Noted  . Genetic testing 04/10/2020  . Family history of breast cancer   . Family history of lung cancer   . Carcinoma of upper-inner quadrant of left breast in female, estrogen receptor positive ([place]) 03/01/2020  . Osteopenia  05/15/2019  . Estrogen deficiency 04/19/2019  . Nocturnal  leg cramps 04/13/2018  . Colon cancer screening 04/09/2016  . Vitamin D deficiency 04/09/2016  . GERD (gastroesophageal reflux disease) 04/09/2016  . Enlarged thyroid 12/05/2015  . Contracture of palmar fascia 07/10/2015  . Encounter for routine gynecological examination 02/20/2015  . Near syncope 08/05/2014  . Abnormal EKG 08/05/2014  . Anxiety 08/05/2014  . H/O cold sores 06/06/2014  . Dupuytren's contracture of right hand 02/08/2014  . Stress reaction 03/09/2013  . Routine general medical examination at a health care facility 12/05/2010  . Other screening mammogram 12/05/2010  . Raynaud disease 12/05/2010  . Hyperlipidemia 06/29/2010  . HYPERTENSION, BENIGN ESSENTIAL 06/29/2010  . Lumbar back pain with radiculopathy affecting right lower extremity 06/29/2010    Rosalyn Gess OTR/L,CLT 01/03/2021, 10:15 AM  [facility] [address] [place], [place], [place], [address] Phone: [phone]   Fax:  [phone]  Name: Brittney Johnson MRN: [id number] Date of Birth: July 31, 1955

## 2021-01-03 NOTE — Assessment & Plan Note (Addendum)
#  Left breast cancer stage I ER/PR positive HER-2 negative; s/p lumpectomy; Oncotype recurrence- LOW RISK; s/p RT [nov 5th, 2021]; no adjuvant chemotherapy.  Mammogram August 2022-Dr. Cintron  #On anastrozole tolerating well.  No major side effects noted except for MSK [see below]Continue anastrozole for 5 years.  # Joint pains/MSK- G-1; recommend continue walking/biking.  # BMD OCT 2020- T score+ -1.4-osteopenia; on ca+vit D>   # DISPOSITION: # Follow-up in 6 months-MD; no labs; BMD prior-  Dr. Jacinto Reap..

## 2021-01-08 DIAGNOSIS — I890 Lymphedema, not elsewhere classified: Secondary | ICD-10-CM | POA: Diagnosis not present

## 2021-01-18 DIAGNOSIS — C50112 Malignant neoplasm of central portion of left female breast: Secondary | ICD-10-CM | POA: Diagnosis not present

## 2021-01-18 DIAGNOSIS — Z4432 Encounter for fitting and adjustment of external left breast prosthesis: Secondary | ICD-10-CM | POA: Diagnosis not present

## 2021-02-05 ENCOUNTER — Encounter: Payer: Self-pay | Admitting: Internal Medicine

## 2021-02-23 ENCOUNTER — Other Ambulatory Visit: Payer: Self-pay | Admitting: Orthopedic Surgery

## 2021-02-23 DIAGNOSIS — M720 Palmar fascial fibromatosis [Dupuytren]: Secondary | ICD-10-CM | POA: Diagnosis not present

## 2021-02-23 DIAGNOSIS — M654 Radial styloid tenosynovitis [de Quervain]: Secondary | ICD-10-CM | POA: Diagnosis not present

## 2021-02-23 DIAGNOSIS — G8918 Other acute postprocedural pain: Secondary | ICD-10-CM | POA: Diagnosis not present

## 2021-03-02 DIAGNOSIS — M25641 Stiffness of right hand, not elsewhere classified: Secondary | ICD-10-CM | POA: Diagnosis not present

## 2021-03-08 DIAGNOSIS — M25641 Stiffness of right hand, not elsewhere classified: Secondary | ICD-10-CM | POA: Diagnosis not present

## 2021-03-15 DIAGNOSIS — M25641 Stiffness of right hand, not elsewhere classified: Secondary | ICD-10-CM | POA: Diagnosis not present

## 2021-03-22 DIAGNOSIS — M25641 Stiffness of right hand, not elsewhere classified: Secondary | ICD-10-CM | POA: Diagnosis not present

## 2021-03-29 DIAGNOSIS — M25641 Stiffness of right hand, not elsewhere classified: Secondary | ICD-10-CM | POA: Diagnosis not present

## 2021-04-03 DIAGNOSIS — M25641 Stiffness of right hand, not elsewhere classified: Secondary | ICD-10-CM | POA: Diagnosis not present

## 2021-04-09 ENCOUNTER — Other Ambulatory Visit: Payer: Self-pay | Admitting: General Surgery

## 2021-04-09 DIAGNOSIS — Z853 Personal history of malignant neoplasm of breast: Secondary | ICD-10-CM

## 2021-04-12 ENCOUNTER — Ambulatory Visit (INDEPENDENT_AMBULATORY_CARE_PROVIDER_SITE_OTHER): Payer: BC Managed Care – PPO | Admitting: Dermatology

## 2021-04-12 ENCOUNTER — Other Ambulatory Visit: Payer: Self-pay

## 2021-04-12 DIAGNOSIS — L570 Actinic keratosis: Secondary | ICD-10-CM | POA: Diagnosis not present

## 2021-04-12 DIAGNOSIS — L738 Other specified follicular disorders: Secondary | ICD-10-CM

## 2021-04-12 DIAGNOSIS — Z872 Personal history of diseases of the skin and subcutaneous tissue: Secondary | ICD-10-CM

## 2021-04-12 DIAGNOSIS — L578 Other skin changes due to chronic exposure to nonionizing radiation: Secondary | ICD-10-CM

## 2021-04-12 DIAGNOSIS — Z853 Personal history of malignant neoplasm of breast: Secondary | ICD-10-CM

## 2021-04-12 DIAGNOSIS — Z1283 Encounter for screening for malignant neoplasm of skin: Secondary | ICD-10-CM

## 2021-04-12 DIAGNOSIS — L718 Other rosacea: Secondary | ICD-10-CM | POA: Diagnosis not present

## 2021-04-12 DIAGNOSIS — L820 Inflamed seborrheic keratosis: Secondary | ICD-10-CM

## 2021-04-12 DIAGNOSIS — M25641 Stiffness of right hand, not elsewhere classified: Secondary | ICD-10-CM | POA: Diagnosis not present

## 2021-04-12 DIAGNOSIS — D1800 Hemangioma unspecified site: Secondary | ICD-10-CM

## 2021-04-12 DIAGNOSIS — I781 Nevus, non-neoplastic: Secondary | ICD-10-CM

## 2021-04-12 DIAGNOSIS — L821 Other seborrheic keratosis: Secondary | ICD-10-CM

## 2021-04-12 DIAGNOSIS — D229 Melanocytic nevi, unspecified: Secondary | ICD-10-CM

## 2021-04-12 DIAGNOSIS — L814 Other melanin hyperpigmentation: Secondary | ICD-10-CM

## 2021-04-12 NOTE — Patient Instructions (Signed)

## 2021-04-12 NOTE — Progress Notes (Signed)
Follow-Up Visit   Subjective  Brittney Johnson is a 65 y.o. female who presents for the following: Total body skin exam (Hx of Aks) and check spots (L lower leg, 28m red and scaly). The patient presents for Total-Body Skin Exam (TBSE) for skin cancer screening and mole check.  The following portions of the chart were reviewed this encounter and updated as appropriate:   Tobacco  Allergies  Meds  Problems  Med Hx  Surg Hx  Fam Hx     Review of Systems:  No other skin or systemic complaints except as noted in HPI or Assessment and Plan.  Objective  Well appearing patient in no apparent distress; mood and affect are within normal limits.  A full examination was performed including scalp, head, eyes, ears, nose, lips, neck, chest, axillae, abdomen, back, buttocks, bilateral upper extremities, bilateral lower extremities, hands, feet, fingers, toes, fingernails, and toenails. All findings within normal limits unless otherwise noted below.  L lower leg x 2 (2) Pink scaly macules   face Dilated blood vessels cheeks  R med calf x 1, Total = 1 Erythematous keratotic or waxy stuck-on papule or plaque.    Assessment & Plan   Lentigines - Scattered tan macules - Due to sun exposure - Benign-appearing, observe - Recommend daily broad spectrum sunscreen SPF 30+ to sun-exposed areas, reapply every 2 hours as needed. - Call for any changes  Seborrheic Keratoses - Stuck-on, waxy, tan-brown papules and/or plaques  - Benign-appearing - Discussed benign etiology and prognosis. - Observe - Call for any changes  Melanocytic Nevi - Tan-brown and/or pink-flesh-colored symmetric macules and papules - Benign appearing on exam today - Observation - Call clinic for new or changing moles - Recommend daily use of broad spectrum spf 30+ sunscreen to sun-exposed areas.   Hemangiomas - Red papules - Discussed benign nature - Observe - Call for any changes  Actinic Damage - Chronic  condition, secondary to cumulative UV/sun exposure - diffuse scaly erythematous macules with underlying dyspigmentation - Recommend daily broad spectrum sunscreen SPF 30+ to sun-exposed areas, reapply every 2 hours as needed.  - Staying in the shade or wearing long sleeves, sun glasses (UVA+UVB protection) and wide brim hats (4-inch brim around the entire circumference of the hat) are also recommended for sun protection.  - Call for new or changing lesions.  Skin cancer screening performed today.  Hx of breast cancer - L breast - no lymphadenopathy  AK (actinic keratosis) (2) L lower leg x 2  Destruction of lesion - L lower leg x 2 Complexity: simple   Destruction method: cryotherapy   Informed consent: discussed and consent obtained   Timeout:  patient name, date of birth, surgical site, and procedure verified Lesion destroyed using liquid nitrogen: Yes   Region frozen until ice ball extended beyond lesion: Yes   Outcome: patient tolerated procedure well with no complications   Post-procedure details: wound care instructions given    Telangiectasias Erythrotelangiectatic rosacea face Benign Discussed IPL $350 per txt session, several txts for best results.  Rosacea is a chronic progressive skin condition usually affecting the face of adults, causing redness and/or acne bumps. It is treatable but not curable. It sometimes affects the eyes (ocular rosacea) as well. It may respond to topical and/or systemic medication and can flare with stress, sun exposure, alcohol, exercise and some foods.  Daily application of broad spectrum spf 30+ sunscreen to face is recommended to reduce flares.  Inflamed seborrheic keratosis R med  calf x 1, Total = 1  Destruction of lesion - R med calf x 1, Total = 1 Complexity: simple   Destruction method: cryotherapy   Informed consent: discussed and consent obtained   Timeout:  patient name, date of birth, surgical site, and procedure verified Lesion  destroyed using liquid nitrogen: Yes   Region frozen until ice ball extended beyond lesion: Yes   Outcome: patient tolerated procedure well with no complications   Post-procedure details: wound care instructions given    Skin cancer screening  Sebaceous Hyperplasia - Small yellow papules with a central dell - Benign - Observe   Return in about 1 year (around 04/12/2022) for TBSE, Hx of AKs.  I, Othelia Pulling, RMA, am acting as scribe for Sarina Ser, MD . Documentation: I have reviewed the above documentation for accuracy and completeness, and I agree with the above.  Sarina Ser, MD

## 2021-04-14 ENCOUNTER — Encounter: Payer: Self-pay | Admitting: Dermatology

## 2021-04-16 ENCOUNTER — Ambulatory Visit
Admission: RE | Admit: 2021-04-16 | Discharge: 2021-04-16 | Disposition: A | Discharge status: Home / Self Care | Payer: BC Managed Care – PPO | Source: Ambulatory Visit | Attending: Radiation Oncology | Admitting: Radiation Oncology

## 2021-04-16 ENCOUNTER — Encounter: Payer: Self-pay | Admitting: Radiation Oncology

## 2021-04-16 ENCOUNTER — Telehealth: Payer: Self-pay | Admitting: Family Medicine

## 2021-04-16 VITALS — BP 127/72 | HR 71 | Temp 97.6°F | Wt 158.0 lb

## 2021-04-16 DIAGNOSIS — I10 Essential (primary) hypertension: Secondary | ICD-10-CM

## 2021-04-16 DIAGNOSIS — Z923 Personal history of irradiation: Secondary | ICD-10-CM | POA: Insufficient documentation

## 2021-04-16 DIAGNOSIS — Z08 Encounter for follow-up examination after completed treatment for malignant neoplasm: Secondary | ICD-10-CM | POA: Diagnosis not present

## 2021-04-16 DIAGNOSIS — Z79811 Long term (current) use of aromatase inhibitors: Secondary | ICD-10-CM | POA: Diagnosis not present

## 2021-04-16 DIAGNOSIS — Z170 Estrogen receptor positive status [ER+]: Secondary | ICD-10-CM | POA: Diagnosis not present

## 2021-04-16 DIAGNOSIS — E7800 Pure hypercholesterolemia, unspecified: Secondary | ICD-10-CM

## 2021-04-16 DIAGNOSIS — E559 Vitamin D deficiency, unspecified: Secondary | ICD-10-CM

## 2021-04-16 DIAGNOSIS — C50212 Malignant neoplasm of upper-inner quadrant of left female breast: Secondary | ICD-10-CM

## 2021-04-16 NOTE — Telephone Encounter (Signed)
-----   Message from Ellamae Sia sent at 04/03/2021 11:16 AM EDT ----- Regarding: Lab orders for Tuesday, 9.20.22 Patient is scheduled for CPX labs, please order future labs, Thanks , Karna Christmas

## 2021-04-16 NOTE — Progress Notes (Signed)
Radiation Oncology Follow up Note  Name: Brittney Johnson   Date:   04/16/2021 MRN:  [id number] DOB: 1955/08/07    This 65 y.o. female presents to the clinic today for 58-monthfollow-up status post whole breast radiation to her left breast for stage Ia ER/PR positive invasive mammary carcinoma with micropapillary features.  REFERRING PROVIDER: Tower, MWynelle Fanny MD  HPI: Patient is a 65year old female now out 10 months having completed whole breast radiation to her left breast for stage Ia ER/PR positive invasive mammary carcinoma.  Seen today in routine follow-up she is doing well.  She specifically denies breast tenderness cough or bone pain..  She is currently on Arimidex tolerating it well without side effect.  She is scheduled for mammograms in October.  COMPLICATIONS OF TREATMENT: none  FOLLOW UP COMPLIANCE: keeps appointments   PHYSICAL EXAM:  BP 127/72   Pulse 71   Temp 97.6 F (36.4 C) (Tympanic)   Wt 158 lb (71.7 kg)   LMP 07/30/2003   BMI 27.12 kg/m  Lungs are clear to A&P cardiac examination essentially unremarkable with regular rate and rhythm. No dominant mass or nodularity is noted in either breast in 2 positions examined. Incision is well-healed. No axillary or supraclavicular adenopathy is appreciated. Cosmetic result is excellent.  Well-developed well-nourished patient in NAD. HEENT reveals PERLA, EOMI, discs not visualized.  Oral cavity is clear. No oral mucosal lesions are identified. Neck is clear without evidence of cervical or supraclavicular adenopathy. Lungs are clear to A&P. Cardiac examination is essentially unremarkable with regular rate and rhythm without murmur rub or thrill. Abdomen is benign with no organomegaly or masses noted. Motor sensory and DTR levels are equal and symmetric in the upper and lower extremities. Cranial nerves II through XII are grossly intact. Proprioception is intact. No peripheral adenopathy or edema is identified. No motor or sensory  levels are noted. Crude visual fields are within normal range.  RADIOLOGY RESULTS: No current films for review  PLAN: Present time patient is doing well 10 months out from whole breast radiation with no evidence of disease.  On pleased with overall progress.  I have asked to see her back in 6 months for follow-up and then will start once year follow-up visits.  Patient knows to call with any concerns.  I would like to take this opportunity to thank you for allowing me to participate in the care of your patient..Noreene Filbert MD

## 2021-04-17 ENCOUNTER — Other Ambulatory Visit: Payer: BC Managed Care – PPO

## 2021-04-19 DIAGNOSIS — M25641 Stiffness of right hand, not elsewhere classified: Secondary | ICD-10-CM | POA: Diagnosis not present

## 2021-04-20 ENCOUNTER — Encounter: Payer: BC Managed Care – PPO | Admitting: Family Medicine

## 2021-04-23 ENCOUNTER — Encounter: Payer: Self-pay | Admitting: Family Medicine

## 2021-04-23 ENCOUNTER — Ambulatory Visit (INDEPENDENT_AMBULATORY_CARE_PROVIDER_SITE_OTHER): Payer: BC Managed Care – PPO | Admitting: Family Medicine

## 2021-04-23 ENCOUNTER — Other Ambulatory Visit: Payer: Self-pay

## 2021-04-23 VITALS — BP 131/70 | HR 83 | Temp 98.0°F | Ht 64.5 in | Wt 159.2 lb

## 2021-04-23 DIAGNOSIS — M85859 Other specified disorders of bone density and structure, unspecified thigh: Secondary | ICD-10-CM

## 2021-04-23 DIAGNOSIS — Z0000 Encounter for general adult medical examination without abnormal findings: Secondary | ICD-10-CM | POA: Diagnosis not present

## 2021-04-23 DIAGNOSIS — M720 Palmar fascial fibromatosis [Dupuytren]: Secondary | ICD-10-CM

## 2021-04-23 DIAGNOSIS — I10 Essential (primary) hypertension: Secondary | ICD-10-CM | POA: Diagnosis not present

## 2021-04-23 DIAGNOSIS — E559 Vitamin D deficiency, unspecified: Secondary | ICD-10-CM | POA: Diagnosis not present

## 2021-04-23 DIAGNOSIS — Z23 Encounter for immunization: Secondary | ICD-10-CM

## 2021-04-23 DIAGNOSIS — E7800 Pure hypercholesterolemia, unspecified: Secondary | ICD-10-CM | POA: Diagnosis not present

## 2021-04-23 DIAGNOSIS — Z1211 Encounter for screening for malignant neoplasm of colon: Secondary | ICD-10-CM

## 2021-04-23 LAB — COMPREHENSIVE METABOLIC PANEL
ALT: 19 U/L (ref 0–35)
AST: 18 U/L (ref 0–37)
Albumin: 4.4 g/dL (ref 3.5–5.2)
Alkaline Phosphatase: 40 U/L (ref 39–117)
BUN: 11 mg/dL (ref 6–23)
CO2: 28 mEq/L (ref 19–32)
Calcium: 9.7 mg/dL (ref 8.4–10.5)
Chloride: 105 mEq/L (ref 96–112)
Creatinine, Ser: 0.78 mg/dL (ref 0.40–1.20)
GFR: 78.55 mL/min (ref >60.00)
Glucose, Bld: 97 mg/dL (ref 70–99)
Potassium: 4.3 mEq/L (ref 3.5–5.1)
Sodium: 140 mEq/L (ref 135–145)
Total Bilirubin: 0.4 mg/dL (ref 0.2–1.2)
Total Protein: 7.1 g/dL (ref 6.0–8.3)

## 2021-04-23 LAB — TSH: TSH: 1.65 u[IU]/mL (ref 0.35–5.50)

## 2021-04-23 LAB — CBC WITH DIFFERENTIAL/PLATELET
Basophils Absolute: 0 10*3/uL (ref 0.0–0.1)
Basophils Relative: 0.9 % (ref 0.0–3.0)
Eosinophils Absolute: 0.1 10*3/uL (ref 0.0–0.7)
Eosinophils Relative: 1.2 % (ref 0.0–5.0)
HCT: 36.5 % (ref 36.0–46.0)
Hemoglobin: 12.1 g/dL (ref 12.0–15.0)
Lymphocytes Relative: 27.4 % (ref 12.0–46.0)
Lymphs Abs: 1.2 10*3/uL (ref 0.7–4.0)
MCHC: 33.1 g/dL (ref 30.0–36.0)
MCV: 88.8 fl (ref 78.0–100.0)
Monocytes Absolute: 0.4 10*3/uL (ref 0.1–1.0)
Monocytes Relative: 8.4 % (ref 3.0–12.0)
Neutro Abs: 2.6 10*3/uL (ref 1.4–7.7)
Neutrophils Relative %: 62.1 % (ref 43.0–77.0)
Platelets: 219 10*3/uL (ref 150.0–400.0)
RBC: 4.11 Mil/uL (ref 3.87–5.11)
RDW: 14.3 % (ref 11.5–15.5)
WBC: 4.2 10*3/uL (ref 4.0–10.5)

## 2021-04-23 LAB — LIPID PANEL
Cholesterol: 197 mg/dL (ref 0–200)
HDL: 70.6 mg/dL (ref >39.00)
LDL Cholesterol: 101 mg/dL — ABNORMAL HIGH (ref 0–99)
NonHDL: 126.81
Total CHOL/HDL Ratio: 3
Triglycerides: 129 mg/dL (ref 0.0–149.0)
VLDL: 25.8 mg/dL (ref 0.0–40.0)

## 2021-04-23 LAB — VITAMIN D 25 HYDROXY (VIT D DEFICIENCY, FRACTURES): VITD: 33.63 ng/mL (ref 30.00–100.00)

## 2021-04-23 MED ORDER — LISINOPRIL 10 MG PO TABS: 10.0000 mg | ORAL_TABLET | Freq: Every day | ORAL | 3 refills | Status: DC

## 2021-04-23 MED ORDER — AMLODIPINE BESYLATE 5 MG PO TABS: 5.0000 mg | ORAL_TABLET | Freq: Every day | ORAL | 3 refills | Status: DC

## 2021-04-23 MED ORDER — ATORVASTATIN CALCIUM 20 MG PO TABS: 20.0000 mg | ORAL_TABLET | Freq: Every day | ORAL | 3 refills | Status: DC

## 2021-04-23 NOTE — Assessment & Plan Note (Signed)
dexa 2 y ago reviewed Has one ordered by oncology in dec  Taking arimidex now-inc risk Taking ca and D Good exercise  No falls or fx  Disc need for calcium/ vitamin D/ wt bearing exercise and bone density test every 2 y to monitor Disc safety/ fracture risk in detail

## 2021-04-23 NOTE — Assessment & Plan Note (Signed)
bp in fair control at this time  BP Readings from Last 1 Encounters:  04/23/21 131/70   No changes needed Most recent labs reviewed  Disc lifstyle change with low sodium diet and exercise  Plan to continue  Amlodipine 5 mg daily  Lisinopril 10 mg daily  Labs ordered

## 2021-04-23 NOTE — Progress Notes (Signed)
Subjective:    Patient ID: Wyonia Hough, female    DOB: 10/11/1955, 65 y.o.   MRN: [id number]  This visit occurred during the SARS-CoV-2 public health emergency.  Safety protocols were in place, including screening questions prior to the visit, additional usage of staff PPE, and extensive cleaning of exam room while observing appropriate contact time as indicated for disinfecting solutions.   HPI Here for health maintenance exam and to review chronic medical problems    Wt Readings from Last 3 Encounters:  04/23/21 159 lb 4 oz (72.2 kg)  04/16/21 158 lb (71.7 kg)  01/03/21 158 lb (71.7 kg)   26.91 kg/m  Doing ok  Had surgery for dupuytren's 8 wk ago R hand  Still tingling and stiffness/in PT  Will need other hand    Zoster status interested in shingrix if covered  Covid immunized Flu shot -today  Pna vaccines utd  Tdap 6/21  Mammogram 7/21-- mammogram and [place] planned in oct  Personal h/o breast cancer  Taking arimidex - tolerating that so far /most likely 5 years (she is 1 y cancer free)  Self breast exam-no lumps or changes   Colonoscopy 2/22 with 3 y recall   No gyn problems   Dexa 10/20 -osteopenia-has one scheduled for December 7 th  Falls - none Fractures-none Supplements -ca and D , due for a D level (onc recommended going up on the dose)  Exercise-bike and walking  Supracervical hysterectomy in the past   HTN bp is stable today  No cp or palpitations or headaches or edema  No side effects to medicines  BP Readings from Last 3 Encounters:  04/23/21 131/70  04/16/21 127/72  01/03/21 129/73    Amlodipine 5 mg daily  Lisinopril 10 mg daily   Hyperlipidemia  Lab Results  Component Value Date   CHOL 199 04/14/2020   HDL 66.40 04/14/2020   LDLCALC 111 (H) 04/14/2020   LDLDIRECT 160.9 03/02/2013   TRIG 109.0 04/14/2020   CHOLHDL 3 04/14/2020   Due for labs  Atorvastatin 10 mg daily   No depression  PHQ score of 0 Depression screen [facility] 2/9  04/23/2021 04/19/2020 04/19/2019 04/13/2018 04/11/2017  Decreased Interest 0 0 0 0 0  Down, Depressed, Hopeless 0 0 0 0 0  PHQ - 2 Score 0 0 0 0 0  Altered sleeping 0 0 - - -  Tired, decreased energy 0 0 - - -  Change in appetite 0 1 - - -  Feeling bad or failure about yourself  0 0 - - -  Trouble concentrating 0 0 - - -  Moving slowly or fidgety/restless 0 0 - - -  Suicidal thoughts 0 0 - - -  PHQ-9 Score 0 1 - - -  Difficult doing work/chores Not difficult at all Not difficult at all - - -   Patient Active Problem List   Diagnosis Date Noted   Genetic testing 04/10/2020   Family history of breast cancer    Family history of lung cancer    Carcinoma of upper-inner quadrant of left breast in female, estrogen receptor positive ([place]) 03/01/2020   Osteopenia 05/15/2019   Estrogen deficiency 04/19/2019   Nocturnal leg cramps 04/13/2018   Colon cancer screening 04/09/2016   Vitamin D deficiency 04/09/2016   GERD (gastroesophageal reflux disease) 04/09/2016   Enlarged thyroid 12/05/2015   Contracture of palmar fascia 07/10/2015   Encounter for routine gynecological examination 02/20/2015   Near syncope 08/05/2014   Abnormal  EKG 08/05/2014   Anxiety 08/05/2014   H/O cold sores 06/06/2014   Dupuytren's contracture of right hand 02/08/2014   Stress reaction 03/09/2013   Routine general medical examination at a health care facility 12/05/2010   Other screening mammogram 12/05/2010   Raynaud disease 12/05/2010   Hyperlipidemia 06/29/2010   HYPERTENSION, BENIGN ESSENTIAL 06/29/2010   Lumbar back pain with radiculopathy affecting right lower extremity 06/29/2010   Past Medical History:  Diagnosis Date   Atypical pneumonia 1998   not hosp   Breast cancer ([place]) 2021   left-sx, radation completed   DDD (degenerative disc disease), cervical    Family history of breast cancer    Family history of lung cancer    Hyperlipidemia    Hypertension    Low back pain    on and off   PONV  (postoperative nausea and vomiting)    due to some sedations!   Syncope and collapse 07/16/2007   Past Surgical History:  Procedure Laterality Date   ABDOMINAL HYSTERECTOMY     supracervical- L ovary remains   BREAST BIOPSY Left 02/22/2020   Q clip [place] bx, [facility]   CARDIOVASCULAR STRESS TEST     08/28/06 normal    COLONOSCOPY  08/13/2016   [facility]   left hand surgery Left 2016   Dupuytrens   LUMBAR DISC SURGERY  08/2017   [facility]   OOPHORECTOMY Right    Right ovary removed    PARTIAL MASTECTOMY WITH NEEDLE LOCALIZATION AND AXILLARY SENTINEL LYMPH NODE BX Left 03/15/2020   Procedure: PARTIAL MASTECTOMY WITH RF TAG PLACEMENT AND AXILLARY SENTINEL LYMPH NODE BX;  Surgeon: Herbert Pun, MD;  Location: [facility] ORS;  Service: General;  Laterality: Left;   POLYPECTOMY     TUBAL LIGATION     ? 1979   Social History   Tobacco Use   Smoking status: Former    Packs/day: 0.50    Years: 5.00    Pack years: 2.50    Types: Cigarettes    Quit date: 07/29/1997    Years since quitting: 23.7   Smokeless tobacco: Never  Vaping Use   Vaping Use: Never used  Substance Use Topics   Alcohol use: Yes    Alcohol/week: 1.0 standard drink    Types: 1 Glasses of wine per week   Drug use: No   Family History  Problem Relation Age of Onset   Lung cancer Father 42   Hyperlipidemia Mother    Diabetes Mother    Hypertension Mother    Stroke Mother    Heart disease Other    Lung cancer Other        uncle   Diabetes Other        aunt   Coronary artery disease Maternal Grandfather    Breast cancer Maternal Aunt        dx under 34   Lung cancer Maternal Uncle    Lung cancer Paternal Uncle    Cancer Cousin        unk types   Cancer Cousin        unk types   Thyroid disease Neg Hx    Colon cancer Neg Hx    Colon polyps Neg Hx    Esophageal cancer Neg Hx    Rectal cancer Neg Hx    Stomach cancer Neg Hx    Allergies  Allergen Reactions   Codeine     REACTION: passed out   Bee Venom  Swelling   Current Outpatient Medications on  File Prior to Visit  Medication Sig Dispense Refill   anastrozole (ARIMIDEX) 1 MG tablet TAKE 1 TABLET DAILY 90 tablet 3   aspirin 81 MG EC tablet Take 81 mg by mouth every Monday, Wednesday, and Friday.      Calcium Citrate-Vitamin D (CALCIUM + D PO) Take by mouth.     Lifitegrast (XIIDRA) 5 % SOLN Place 1 drop into both eyes at bedtime.      Multiple Vitamin (MULTIVITAMIN) tablet Take 1 tablet by mouth daily.     Omega-3 Fatty Acids (FISH OIL) 1200 MG CAPS Take 1,200 mg by mouth 2 (two) times daily.     pyridoxine (B-6) 200 MG tablet Take 200 mg by mouth daily.     No current facility-administered medications on file prior to visit.     Review of Systems  Constitutional:  Negative for activity change, appetite change, fatigue, fever and unexpected weight change.  HENT:  Negative for congestion, ear pain, rhinorrhea, sinus pressure and sore throat.   Eyes:  Negative for pain, redness and visual disturbance.  Respiratory:  Negative for cough, shortness of breath and wheezing.   Cardiovascular:  Negative for chest pain and palpitations.  Gastrointestinal:  Negative for abdominal pain, blood in stool, constipation and diarrhea.  Endocrine: Negative for polydipsia and polyuria.  Genitourinary:  Negative for dysuria, frequency and urgency.  Musculoskeletal:  Negative for arthralgias, back pain and myalgias.       Hand contractures from dupuytren's    Skin:  Negative for pallor and rash.  Allergic/Immunologic: Negative for environmental allergies.  Neurological:  Negative for dizziness, syncope and headaches.  Hematological:  Negative for adenopathy. Does not bruise/bleed easily.  Psychiatric/Behavioral:  Negative for decreased concentration and dysphoric mood. The patient is not nervous/anxious.       Objective:   Physical Exam Constitutional:      General: She is not in acute distress.    Appearance: Normal appearance. She is  well-developed and normal weight. She is not ill-appearing or diaphoretic.  HENT:     Head: Normocephalic and atraumatic.     Right Ear: Tympanic membrane, ear canal and external ear normal.     Left Ear: Tympanic membrane, ear canal and external ear normal.     Nose: Nose normal. No congestion.     Mouth/Throat:     Mouth: Mucous membranes are moist.     Pharynx: Oropharynx is clear. No posterior oropharyngeal erythema.  Eyes:     General: No scleral icterus.    Extraocular Movements: Extraocular movements intact.     Conjunctiva/sclera: Conjunctivae normal.     Pupils: Pupils are equal, round, and reactive to light.  Neck:     Thyroid: No thyromegaly.     Vascular: No carotid bruit or JVD.  Cardiovascular:     Rate and Rhythm: Normal rate and regular rhythm.     Pulses: Normal pulses.     Heart sounds: Normal heart sounds.    No gallop.  Pulmonary:     Effort: Pulmonary effort is normal. No respiratory distress.     Breath sounds: Normal breath sounds. No wheezing.     Comments: Good air exch Chest:     Chest wall: No tenderness.  Abdominal:     General: Bowel sounds are normal. There is no distension or abdominal bruit.     Palpations: Abdomen is soft. There is no mass.     Tenderness: There is no abdominal tenderness.     Hernia: No hernia  is present.  Genitourinary:    Comments: Breast exam: No mass, nodules, thickening, tenderness, bulging, retraction, inflamation, nipple discharge or skin changes noted.  No axillary or clavicular LA.     Musculoskeletal:        General: No tenderness. Normal range of motion.     Cervical back: Normal range of motion and neck supple. No rigidity. No muscular tenderness.     Right lower leg: No edema.     Left lower leg: No edema.     Comments: No kyphosis   Lymphadenopathy:     Cervical: No cervical adenopathy.  Skin:    General: Skin is warm and dry.     Coloration: Skin is not pale.     Findings: No erythema or rash.   Neurological:     Mental Status: She is alert. Mental status is at baseline.     Cranial Nerves: No cranial nerve deficit.     Motor: No abnormal muscle tone.     Coordination: Coordination normal.     Gait: Gait normal.     Deep Tendon Reflexes: Reflexes are normal and symmetric.  Psychiatric:        Mood and Affect: Mood normal.        Cognition and Memory: Cognition and memory normal.          Assessment & Plan:   Problem List Items Addressed This Visit       Cardiovascular and Mediastinum   HYPERTENSION, BENIGN ESSENTIAL    bp in fair control at this time  BP Readings from Last 1 Encounters:  04/23/21 131/70  No changes needed Most recent labs reviewed  Disc lifstyle change with low sodium diet and exercise  Plan to continue  Amlodipine 5 mg daily  Lisinopril 10 mg daily  Labs ordered      Relevant Medications   lisinopril (ZESTRIL) 10 MG tablet   atorvastatin (LIPITOR) 20 MG tablet   amLODipine (NORVASC) 5 MG tablet   Other Relevant Orders   CBC with Differential/Platelet   Comprehensive metabolic panel   TSH   Lipid panel     Musculoskeletal and Integument   Dupuytren's contracture of right hand    Recovering from surgery on R hand      Osteopenia    dexa 2 y ago reviewed Has one ordered by oncology in dec  Taking arimidex now-inc risk Taking ca and D Good exercise  No falls or fx  Disc need for calcium/ vitamin D/ wt bearing exercise and bone density test every 2 y to monitor Disc safety/ fracture risk in detail          Other   Hyperlipidemia    Disc goals for lipids and reasons to control them Rev last labs with pt Rev low sat fat diet in detail Taking atorvastatin 20 mg daily  Good diet  Lipid panel ordered      Relevant Medications   lisinopril (ZESTRIL) 10 MG tablet   atorvastatin (LIPITOR) 20 MG tablet   amLODipine (NORVASC) 5 MG tablet   Other Relevant Orders   Lipid panel   Routine general medical examination at a health  care facility - Primary    Reviewed health habits including diet and exercise and skin cancer prevention Reviewed appropriate screening tests for age  Also reviewed health mt list, fam hx and immunization status , as well as social and family history   See HPI Labs ordered Discussed shingrix vaccine, interested and handout given Declines covid  booster for now  Flu shot given today  Mammogram planned for oct/in setting of personal breast cancer Colonoscopy utd  dexa planned for December/no falls or fractures       Relevant Orders   Flu Vaccine QUAD High Dose(Fluad) (Completed)   Colon cancer screening    Colonoscopy 2/22 with 3 y recall      Vitamin D deficiency    D level today  In setting of osteopenia  Taking ca and D  Discussed importance to bone and overall health      Relevant Orders   VITAMIN D 25 Hydroxy (Vit-D Deficiency, Fractures)   Other Visit Diagnoses     Need for influenza vaccination       Relevant Orders   Flu Vaccine QUAD High Dose(Fluad) (Completed)

## 2021-04-23 NOTE — Assessment & Plan Note (Signed)
Colonoscopy 2/22 with 3 y recall

## 2021-04-23 NOTE — Assessment & Plan Note (Signed)
Disc goals for lipids and reasons to control them Rev last labs with pt Rev low sat fat diet in detail Taking atorvastatin 20 mg daily  Good diet  Lipid panel ordered

## 2021-04-23 NOTE — Assessment & Plan Note (Signed)
Reviewed health habits including diet and exercise and skin cancer prevention Reviewed appropriate screening tests for age  Also reviewed health mt list, fam hx and immunization status , as well as social and family history   See HPI Labs ordered Discussed shingrix vaccine, interested and handout given Declines covid booster for now  Flu shot given today  Mammogram planned for oct/in setting of personal breast cancer Colonoscopy utd  dexa planned for December/no falls or fractures

## 2021-04-23 NOTE — Patient Instructions (Addendum)
If you are interested in the shingles vaccine series (Shingrix), call your insurance or pharmacy to check on coverage and location it must be given.  If affordable - you can schedule it here or at your pharmacy depending on coverage   Take care of yourself Flu shot today  Labs today   No change in medicines

## 2021-04-23 NOTE — Assessment & Plan Note (Signed)
D level today  In setting of osteopenia  Taking ca and D  Discussed importance to bone and overall health

## 2021-04-23 NOTE — Assessment & Plan Note (Signed)
Recovering from surgery on R hand

## 2021-04-26 DIAGNOSIS — M25641 Stiffness of right hand, not elsewhere classified: Secondary | ICD-10-CM | POA: Diagnosis not present

## 2021-05-01 ENCOUNTER — Ambulatory Visit
Admission: RE | Admit: 2021-05-01 | Discharge: 2021-05-01 | Disposition: A | Discharge status: Home / Self Care | Payer: BC Managed Care – PPO | Source: Ambulatory Visit | Attending: General Surgery | Admitting: General Surgery

## 2021-05-01 ENCOUNTER — Other Ambulatory Visit: Payer: Self-pay

## 2021-05-01 DIAGNOSIS — Z853 Personal history of malignant neoplasm of breast: Secondary | ICD-10-CM

## 2021-05-01 DIAGNOSIS — R922 Inconclusive mammogram: Secondary | ICD-10-CM | POA: Diagnosis not present

## 2021-05-02 DIAGNOSIS — M25641 Stiffness of right hand, not elsewhere classified: Secondary | ICD-10-CM | POA: Diagnosis not present

## 2021-05-08 DIAGNOSIS — C50212 Malignant neoplasm of upper-inner quadrant of left female breast: Secondary | ICD-10-CM | POA: Diagnosis not present

## 2021-05-10 DIAGNOSIS — M25641 Stiffness of right hand, not elsewhere classified: Secondary | ICD-10-CM | POA: Diagnosis not present

## 2021-05-17 DIAGNOSIS — M25641 Stiffness of right hand, not elsewhere classified: Secondary | ICD-10-CM | POA: Diagnosis not present

## 2021-05-23 DIAGNOSIS — M25641 Stiffness of right hand, not elsewhere classified: Secondary | ICD-10-CM | POA: Diagnosis not present

## 2021-05-31 DIAGNOSIS — Z4789 Encounter for other orthopedic aftercare: Secondary | ICD-10-CM | POA: Diagnosis not present

## 2021-05-31 DIAGNOSIS — M25641 Stiffness of right hand, not elsewhere classified: Secondary | ICD-10-CM | POA: Diagnosis not present

## 2021-05-31 DIAGNOSIS — M720 Palmar fascial fibromatosis [Dupuytren]: Secondary | ICD-10-CM | POA: Diagnosis not present

## 2021-06-14 DIAGNOSIS — M25641 Stiffness of right hand, not elsewhere classified: Secondary | ICD-10-CM | POA: Diagnosis not present

## 2021-06-18 DIAGNOSIS — Z20822 Contact with and (suspected) exposure to covid-19: Secondary | ICD-10-CM | POA: Diagnosis not present

## 2021-06-19 ENCOUNTER — Encounter: Payer: Self-pay | Admitting: Family Medicine

## 2021-06-19 ENCOUNTER — Telehealth: Payer: Self-pay | Admitting: Family Medicine

## 2021-06-19 MED ORDER — MOLNUPIRAVIR EUA 200MG CAPSULE: 4.0000 | ORAL_CAPSULE | Freq: Two times a day (BID) | ORAL | 0 refills | Status: AC

## 2021-06-19 NOTE — Telephone Encounter (Signed)
Pt called in tested positive for covid wants to know can something be called in prior to virtual visit tomorrow . Please advise [phone]

## 2021-06-19 NOTE — Telephone Encounter (Signed)
Pt said she started with a cough on Saturday and had a fever Saturday night. Pt also has cough, congestion, ST from coughing, HA, body aches, slight fever. Pt does want to get the molnupiravir [facility]. She will keep the virtual visit for tomorrow

## 2021-06-19 NOTE — Telephone Encounter (Signed)
By age she qualifies for antiviral -if within 5 days of first symptom We would do molnupiravir  (the other drug has interactions)  Let me know if she wants to do this  Diarrhea/headache can be side effects What pharmacy?  How are symptoms ?

## 2021-06-20 ENCOUNTER — Other Ambulatory Visit: Payer: Self-pay

## 2021-06-20 ENCOUNTER — Encounter: Payer: Self-pay | Admitting: Family Medicine

## 2021-06-20 ENCOUNTER — Telehealth (INDEPENDENT_AMBULATORY_CARE_PROVIDER_SITE_OTHER): Payer: BC Managed Care – PPO | Admitting: Family Medicine

## 2021-06-20 DIAGNOSIS — U071 COVID-19: Secondary | ICD-10-CM | POA: Diagnosis not present

## 2021-06-20 NOTE — Patient Instructions (Signed)
Take the molnupiravir as directed  Drink fluids and rest  mucinex DM is good for cough and congestion  Nasal saline for congestion as needed  Chlorcedin HBP also  Tylenol for fever or pain or headache  Please alert [place] if symptoms worsen (if severe or short of breath please go to the ER)   Update if not starting to improve in a week or if worsening    Isolate a minimum of 5 days or until symptoms are better  Wear mask for 7-10 days after that

## 2021-06-20 NOTE — Progress Notes (Signed)
Virtual Visit via Video Note  I connected with Brittney Johnson on 06/20/21 at  8:30 AM EST by a video enabled telemedicine application and verified that I am speaking with the correct person using two identifiers.  Location: Patient: home Provider: office   I discussed the limitations of evaluation and management by telemedicine and the availability of in person appointments. The patient expressed understanding and agreed to proceed.  This visit occurred during the SARS-CoV-2 public health emergency.  Safety protocols were in place, including screening questions prior to the visit, additional usage of staff PPE, and extensive cleaning of exam room while observing appropriate contact time as indicated for disinfecting solutions.   Parties involved in encounter  Patient: Brittney Johnson  Provider:  Loura Pardon MD   History of Present Illness: Pt presents with c/o covid 19   Started on Saturday with dry cough  Then fever on Sunday  Home test was originally positive  Then temp went up to 103.3  Had chills and aches for 3 days  Then pos test   We sent in molnupirvir yesterday   Now fever is gone Stuffy  Cough - occ prod of clear sputum  A little blood from nose  Headache on and off  Hoarse voice  Throat is sore from cough  Ears hurt a little yesterday  No n/v/d   Taste and smell   No sob or wheeze or chest tightness  Pulse ox has been fine - today 97%    Immunized for covid   Otc Tylenol     Patient Active Problem List   Diagnosis Date Noted   Genetic testing 04/10/2020   Family history of breast cancer    Family history of lung cancer    Carcinoma of upper-inner quadrant of left breast in female, estrogen receptor positive ([place]) 03/01/2020   Osteopenia 05/15/2019   Estrogen deficiency 04/19/2019   Nocturnal leg cramps 04/13/2018   Colon cancer screening 04/09/2016   Vitamin D deficiency 04/09/2016   GERD (gastroesophageal reflux disease) 04/09/2016    Enlarged thyroid 12/05/2015   Contracture of palmar fascia 07/10/2015   Encounter for routine gynecological examination 02/20/2015   Near syncope 08/05/2014   Abnormal EKG 08/05/2014   Anxiety 08/05/2014   H/O cold sores 06/06/2014   Dupuytren's contracture of right hand 02/08/2014   Stress reaction 03/09/2013   Routine general medical examination at a health care facility 12/05/2010   Other screening mammogram 12/05/2010   Raynaud disease 12/05/2010   Hyperlipidemia 06/29/2010   HYPERTENSION, BENIGN ESSENTIAL 06/29/2010   Lumbar back pain with radiculopathy affecting right lower extremity 06/29/2010   Past Medical History:  Diagnosis Date   Atypical pneumonia 1998   not hosp   Breast cancer ([place]) 2021   left-sx, radation completed   DDD (degenerative disc disease), cervical    Family history of breast cancer    Family history of lung cancer    Hyperlipidemia    Hypertension    Low back pain    on and off   PONV (postoperative nausea and vomiting)    due to some sedations!   Syncope and collapse 07/16/2007   Past Surgical History:  Procedure Laterality Date   ABDOMINAL HYSTERECTOMY     supracervical- L ovary remains   BREAST BIOPSY Left 02/22/2020   Q clip [place] bx, [facility]   CARDIOVASCULAR STRESS TEST     08/28/06 normal    COLONOSCOPY  08/13/2016   [facility]le   left hand surgery Left 2016  Dupuytrens   LUMBAR DISC SURGERY  08/2017   [facility]   OOPHORECTOMY Right    Right ovary removed    PARTIAL MASTECTOMY WITH NEEDLE LOCALIZATION AND AXILLARY SENTINEL LYMPH NODE BX Left 03/15/2020   Procedure: PARTIAL MASTECTOMY WITH RF TAG PLACEMENT AND AXILLARY SENTINEL LYMPH NODE BX;  Surgeon: Herbert Pun, MD;  Location: [facility] ORS;  Service: General;  Laterality: Left;   POLYPECTOMY     TUBAL LIGATION     ? 1979   Social History   Tobacco Use   Smoking status: Former    Packs/day: 0.50    Years: 5.00    Pack years: 2.50    Types: Cigarettes    Quit date: 07/29/1997     Years since quitting: 23.9   Smokeless tobacco: Never  Vaping Use   Vaping Use: Never used  Substance Use Topics   Alcohol use: Yes    Alcohol/week: 1.0 standard drink    Types: 1 Glasses of wine per week   Drug use: No   Family History  Problem Relation Age of Onset   Lung cancer Father 60   Hyperlipidemia Mother    Diabetes Mother    Hypertension Mother    Stroke Mother    Heart disease Other    Lung cancer Other        uncle   Diabetes Other        aunt   Coronary artery disease Maternal Grandfather    Breast cancer Maternal Aunt        dx under 72   Lung cancer Maternal Uncle    Lung cancer Paternal Uncle    Cancer Cousin        unk types   Cancer Cousin        unk types   Thyroid disease Neg Hx    Colon cancer Neg Hx    Colon polyps Neg Hx    Esophageal cancer Neg Hx    Rectal cancer Neg Hx    Stomach cancer Neg Hx    Allergies  Allergen Reactions   Codeine     REACTION: passed out   Bee Venom Swelling   Current Outpatient Medications on File Prior to Visit  Medication Sig Dispense Refill   amLODipine (NORVASC) 5 MG tablet Take 1 tablet (5 mg total) by mouth daily. 90 tablet 3   anastrozole (ARIMIDEX) 1 MG tablet TAKE 1 TABLET DAILY 90 tablet 3   aspirin 81 MG EC tablet Take 81 mg by mouth every Monday, Wednesday, and Friday.      atorvastatin (LIPITOR) 20 MG tablet Take 1 tablet (20 mg total) by mouth daily. 90 tablet 3   Calcium Citrate-Vitamin D (CALCIUM + D PO) Take by mouth.     Lifitegrast (XIIDRA) 5 % SOLN Place 1 drop into both eyes at bedtime.      lisinopril (ZESTRIL) 10 MG tablet Take 1 tablet (10 mg total) by mouth daily. 90 tablet 3   molnupiravir EUA (LAGEVRIO) 200 mg CAPS capsule Take 4 capsules (800 mg total) by mouth 2 (two) times daily for 5 days. 40 capsule 0   Multiple Vitamin (MULTIVITAMIN) tablet Take 1 tablet by mouth daily.     Omega-3 Fatty Acids (FISH OIL) 1200 MG CAPS Take 1,200 mg by mouth 2 (two) times daily.     pyridoxine  (B-6) 200 MG tablet Take 200 mg by mouth daily.     No current facility-administered medications on file prior to visit.   Review of  Systems  Constitutional:  Positive for chills, fever and malaise/fatigue.  HENT:  Positive for congestion and sore throat. Negative for ear pain and sinus pain.   Eyes:  Negative for blurred vision, discharge and redness.  Respiratory:  Positive for cough and sputum production. Negative for shortness of breath, wheezing and stridor.   Cardiovascular:  Negative for chest pain, palpitations and leg swelling.  Gastrointestinal:  Negative for abdominal pain, diarrhea, nausea and vomiting.  Musculoskeletal:  Negative for myalgias.  Skin:  Negative for rash.  Neurological:  Positive for headaches. Negative for dizziness.   Observations/Objective: Patient appears well, in no distress Weight is baseline  No facial swelling or asymmetry Voice is hoarse No obvious tremor or mobility impairment Moving neck and UEs normally Able to hear the call well  No wheeze or shortness of breath during interview  Occ dry cough Talkative and mentally sharp with no cognitive changes No skin changes on face or neck , no rash or pallor Affect is normal    Assessment and Plan: Problem List Items Addressed This Visit       Other   COVID-19    With fever/cough and malaise in immunized pt  Started molnupiravir yesterday and fever is down today  Discussed sympt care  Tylenol, expectorant plus DM chlorcedin prn if needed ER precautions discussed   Update if not starting to improve in a week or if worsening   Will isolate for min of 5 d or until symptoms are better        Follow Up Instructions:   Take the molnupiravir as directed  Drink fluids and rest  mucinex DM is good for cough and congestion  Nasal saline for congestion as needed  Chlorcedin HBP also  Tylenol for fever or pain or headache  Please alert [place] if symptoms worsen (if severe or short of breath  please go to the ER)   Update if not starting to improve in a week or if worsening    Isolate a minimum of 5 days or until symptoms are better  Wear mask for 7-10 days after that  I discussed the assessment and treatment plan with the patient. The patient was provided an opportunity to ask questions and all were answered. The patient agreed with the plan and demonstrated an understanding of the instructions.   The patient was advised to call back or seek an in-person evaluation if the symptoms worsen or if the condition fails to improve as anticipated.     Loura Pardon, MD

## 2021-06-20 NOTE — Assessment & Plan Note (Signed)
With fever/cough and malaise in immunized pt  Started molnupiravir yesterday and fever is down today  Discussed sympt care  Tylenol, expectorant plus DM chlorcedin prn if needed ER precautions discussed   Update if not starting to improve in a week or if worsening   Will isolate for min of 5 d or until symptoms are better

## 2021-06-28 DIAGNOSIS — M25641 Stiffness of right hand, not elsewhere classified: Secondary | ICD-10-CM | POA: Diagnosis not present

## 2021-07-02 ENCOUNTER — Ambulatory Visit
Admission: RE | Admit: 2021-07-02 | Discharge: 2021-07-02 | Disposition: A | Discharge status: Home / Self Care | Payer: BC Managed Care – PPO | Source: Ambulatory Visit | Attending: Internal Medicine | Admitting: Internal Medicine

## 2021-07-02 ENCOUNTER — Other Ambulatory Visit: Payer: Self-pay

## 2021-07-02 DIAGNOSIS — M85851 Other specified disorders of bone density and structure, right thigh: Secondary | ICD-10-CM | POA: Diagnosis not present

## 2021-07-02 DIAGNOSIS — Z780 Asymptomatic menopausal state: Secondary | ICD-10-CM | POA: Diagnosis not present

## 2021-07-02 DIAGNOSIS — C50212 Malignant neoplasm of upper-inner quadrant of left female breast: Secondary | ICD-10-CM | POA: Insufficient documentation

## 2021-07-02 DIAGNOSIS — Z170 Estrogen receptor positive status [ER+]: Secondary | ICD-10-CM | POA: Diagnosis not present

## 2021-07-04 ENCOUNTER — Inpatient Hospital Stay: Payer: BC Managed Care – PPO | Attending: Internal Medicine | Admitting: Internal Medicine

## 2021-07-04 ENCOUNTER — Encounter: Payer: Self-pay | Admitting: Internal Medicine

## 2021-07-04 ENCOUNTER — Other Ambulatory Visit: Payer: Self-pay

## 2021-07-04 DIAGNOSIS — Z90710 Acquired absence of both cervix and uterus: Secondary | ICD-10-CM | POA: Insufficient documentation

## 2021-07-04 DIAGNOSIS — I10 Essential (primary) hypertension: Secondary | ICD-10-CM | POA: Diagnosis not present

## 2021-07-04 DIAGNOSIS — Z170 Estrogen receptor positive status [ER+]: Secondary | ICD-10-CM | POA: Diagnosis not present

## 2021-07-04 DIAGNOSIS — Z801 Family history of malignant neoplasm of trachea, bronchus and lung: Secondary | ICD-10-CM | POA: Diagnosis not present

## 2021-07-04 DIAGNOSIS — M2550 Pain in unspecified joint: Secondary | ICD-10-CM | POA: Insufficient documentation

## 2021-07-04 DIAGNOSIS — Z87891 Personal history of nicotine dependence: Secondary | ICD-10-CM | POA: Insufficient documentation

## 2021-07-04 DIAGNOSIS — M8580 Other specified disorders of bone density and structure, unspecified site: Secondary | ICD-10-CM | POA: Insufficient documentation

## 2021-07-04 DIAGNOSIS — Z803 Family history of malignant neoplasm of breast: Secondary | ICD-10-CM | POA: Diagnosis not present

## 2021-07-04 DIAGNOSIS — C50212 Malignant neoplasm of upper-inner quadrant of left female breast: Secondary | ICD-10-CM | POA: Insufficient documentation

## 2021-07-04 DIAGNOSIS — M79641 Pain in right hand: Secondary | ICD-10-CM | POA: Diagnosis not present

## 2021-07-04 NOTE — Progress Notes (Signed)
[facility] [place] NOTE  Patient Care Team: Tower, Wynelle Fanny, MD as PCP - General Noreene Filbert, MD as Referring Physician (Radiation Oncology) Rico Junker, RN as Oncology Nurse Navigator Herbert Pun, MD as Consulting Physician (General Surgery) Cammie Sickle, MD as Consulting Physician (Internal Medicine)  CHIEF COMPLAINTS/PURPOSE OF CONSULTATION: Breast cancer  #  Oncology History Overview Note  # LEFT BREAST CA- INVASIVE MAMMARY CARCINOMA, NO SPECIAL TYPE, WITH MICROPAPILLARY  FEATURES; ER/PR Positive [90%]; Her 2 NEG; G-1; 0.8 x 0.7 x 1 cm.  Oncotype recurrence score= 0; 3% distant recurrence; no chemotherapy; adjuvant radiation;  s/p RT [nov 5th, 2021];   # NOV end 221- START Anastrazole[previous HRT*]  # # SURVIVORSHIP:   # GENETICS: No pathogenic variants; VUS*  DIAGNOSIS: Cure  STAGE: 1        ;  GOALS: Cure  CURRENT/MOST RECENT THERAPY :     Carcinoma of upper-inner quadrant of left breast in female, estrogen receptor positive ([place])  03/01/2020 Initial Diagnosis   Carcinoma of upper-inner quadrant of left breast in female, estrogen receptor positive ([place])    Genetic Testing   Negative genetic testing. No pathogenic variants identified on the [facility] Multi-Cancer Panel. VUS in AIP called c.790_792del and VUS in RET called c.1163T>C identified. The report date is 04/07/2020.  The Multi-Cancer Panel offered by [facility] includes sequencing and/or deletion duplication testing of the following 85 genes: AIP, ALK, APC, ATM, AXIN2,BAP1,  BARD1, BLM, BMPR1A, BRCA1, BRCA2, BRIP1, CASR, CDC73, CDH1, CDK4, CDKN1B, CDKN1C, CDKN2A (p14ARF), CDKN2A (p16INK4a), CEBPA, CHEK2, CTNNA1, DICER1, DIS3L2, EGFR (c.2369C>T, p.Thr790Met variant only), EPCAM (Deletion/duplication testing only), FH, FLCN, GATA2, GPC3, GREM1 (Promoter region deletion/duplication testing only), HOXB13 (c.251G>A, p.Gly84Glu), HRAS, KIT, MAX, MEN1, MET, MITF (c.952G>A, p.Glu318Lys  variant only), MLH1, MSH2, MSH3, MSH6, MUTYH, NBN, NF1, NF2, NTHL1, PALB2, PDGFRA, PHOX2B, PMS2, POLD1, POLE, POT1, PRKAR1A, PTCH1, PTEN, RAD50, RAD51C, RAD51D, RB1, RECQL4, RET, RNF43, RUNX1, SDHAF2, SDHA (sequence changes only), SDHB, SDHC, SDHD, SMAD4, SMARCA4, SMARCB1, SMARCE1, STK11, SUFU, TERC, TERT, TMEM127, TP53, TSC1, TSC2, VHL, WRN and WT1.       HISTORY OF PRESENTING ILLNESS:  Brittney Johnson 65 y.o.  female diagnosis of stage I ER/PR positive HER-2 negative breast cancer on anastrozole is here for follow-up/review results of the bone density test.  Complains of pain in her hand joints on the right side.  However had recent Dupuytren contracture surgery.  Denies any nausea vomiting abdominal pain.  No fever no chills.  Review of Systems  Constitutional:  Negative for chills, diaphoresis, fever, malaise/fatigue and weight loss.  HENT:  Negative for nosebleeds and sore throat.   Eyes:  Negative for double vision.  Respiratory:  Negative for cough, hemoptysis, sputum production, shortness of breath and wheezing.   Cardiovascular:  Negative for chest pain, palpitations, orthopnea and leg swelling.  Gastrointestinal:  Negative for abdominal pain, blood in stool, constipation, diarrhea, heartburn, melena, nausea and vomiting.  Genitourinary:  Negative for dysuria, frequency and urgency.  Musculoskeletal:  Negative for back pain and joint pain.  Skin: Negative.  Negative for itching and rash.  Neurological:  Negative for dizziness, tingling, focal weakness, weakness and headaches.  Endo/Heme/Allergies:  Does not bruise/bleed easily.  Psychiatric/Behavioral:  Negative for depression. The patient is not nervous/anxious and does not have insomnia.     MEDICAL HISTORY:  Past Medical History:  Diagnosis Date   Atypical pneumonia 1998   not hosp   Breast cancer ([place]) 2021   left-sx, radation completed  DDD (degenerative disc disease), cervical    Family history of breast cancer     Family history of lung cancer    Hyperlipidemia    Hypertension    Low back pain    on and off   PONV (postoperative nausea and vomiting)    due to some sedations!   Syncope and collapse 07/16/2007    SURGICAL HISTORY: Past Surgical History:  Procedure Laterality Date   ABDOMINAL HYSTERECTOMY     supracervical- L ovary remains   BREAST BIOPSY Left 02/22/2020   Q clip [place] bx, [facility]   CARDIOVASCULAR STRESS TEST     08/28/06 normal    COLONOSCOPY  08/13/2016   Pyrtle   left hand surgery Left 2016   Dupuytrens   LUMBAR DISC SURGERY  08/2017   [facility]   OOPHORECTOMY Right    Right ovary removed    PARTIAL MASTECTOMY WITH NEEDLE LOCALIZATION AND AXILLARY SENTINEL LYMPH NODE BX Left 03/15/2020   Procedure: PARTIAL MASTECTOMY WITH RF TAG PLACEMENT AND AXILLARY SENTINEL LYMPH NODE BX;  Surgeon: Herbert Pun, MD;  Location: [facility] ORS;  Service: General;  Laterality: Left;   POLYPECTOMY     TUBAL LIGATION     ? 1979    SOCIAL HISTORY: Social History   Socioeconomic History   Marital status: Married    Spouse name: Not on file   Number of children: Not on file   Years of education: Not on file   Highest education level: Not on file  Occupational History   Occupation: Adult nurse: [employer]  Tobacco Use   Smoking status: Former    Packs/day: 0.50    Years: 5.00    Pack years: 2.50    Types: Cigarettes    Quit date: 07/29/1997    Years since quitting: 23.9   Smokeless tobacco: Never  Vaping Use   Vaping Use: Never used  Substance and Sexual Activity   Alcohol use: Yes    Alcohol/week: 1.0 standard drink    Types: 1 Glasses of wine per week   Drug use: No   Sexual activity: Yes  Other Topics Concern   Not on file  Social History Narrative   Kids live close by, 3 grandkids- really enjoys, runs for exercise, ran a 5k March 2011; walks; quit 20 years; rare alcohol. Work for [employer]; lives in [place]. 2 boys- 41s.    Social  Determinants of Health   Financial Resource Strain: Not on file  Food Insecurity: Not on file  Transportation Needs: Not on file  Physical Activity: Not on file  Stress: Not on file  Social Connections: Not on file  Intimate Partner Violence: Not on file    FAMILY HISTORY: Family History  Problem Relation Age of Onset   Lung cancer Father 26   Hyperlipidemia Mother    Diabetes Mother    Hypertension Mother    Stroke Mother    Heart disease Other    Lung cancer Other        uncle   Diabetes Other        aunt   Coronary artery disease Maternal Grandfather    Breast cancer Maternal Aunt        dx under 46   Lung cancer Maternal Uncle    Lung cancer Paternal Uncle    Cancer Cousin        unk types   Cancer Cousin        unk types   Thyroid  disease Neg Hx    Colon cancer Neg Hx    Colon polyps Neg Hx    Esophageal cancer Neg Hx    Rectal cancer Neg Hx    Stomach cancer Neg Hx     ALLERGIES:  is allergic to codeine and bee venom.  MEDICATIONS:  Current Outpatient Medications  Medication Sig Dispense Refill   amLODipine (NORVASC) 5 MG tablet Take 1 tablet (5 mg total) by mouth daily. 90 tablet 3   anastrozole (ARIMIDEX) 1 MG tablet TAKE 1 TABLET DAILY 90 tablet 3   aspirin 81 MG EC tablet Take 81 mg by mouth every Monday, Wednesday, and Friday.      atorvastatin (LIPITOR) 20 MG tablet Take 1 tablet (20 mg total) by mouth daily. 90 tablet 3   Calcium Citrate-Vitamin D (CALCIUM + D PO) Take by mouth.     gabapentin (NEURONTIN) 100 MG capsule Take 100 mg by mouth 3 (three) times daily.     Lifitegrast (XIIDRA) 5 % SOLN Place 1 drop into both eyes at bedtime.      lisinopril (ZESTRIL) 10 MG tablet Take 1 tablet (10 mg total) by mouth daily. 90 tablet 3   Multiple Vitamin (MULTIVITAMIN) tablet Take 1 tablet by mouth daily.     Omega-3 Fatty Acids (FISH OIL) 1200 MG CAPS Take 1,200 mg by mouth 2 (two) times daily.     pyridoxine (B-6) 200 MG tablet Take 200 mg by mouth  daily.     No current facility-administered medications for this visit.      Marland Kitchen  PHYSICAL EXAMINATION: ECOG PERFORMANCE STATUS: 0 - Asymptomatic  Vitals:   07/04/21 1453  BP: 110/64  Pulse: 72  Resp: 16  Temp: 97.7 F (36.5 C)  SpO2: 100%   Filed Weights   07/04/21 1453  Weight: 164 lb (74.4 kg)    Physical Exam HENT:     Head: Normocephalic and atraumatic.     Mouth/Throat:     Pharynx: No oropharyngeal exudate.  Eyes:     Pupils: Pupils are equal, round, and reactive to light.  Cardiovascular:     Rate and Rhythm: Normal rate and regular rhythm.  Pulmonary:     Effort: Pulmonary effort is normal. No respiratory distress.     Breath sounds: Normal breath sounds. No wheezing.  Abdominal:     General: Bowel sounds are normal. There is no distension.     Palpations: Abdomen is soft. There is no mass.     Tenderness: There is no abdominal tenderness. There is no guarding or rebound.  Musculoskeletal:        General: No tenderness. Normal range of motion.     Cervical back: Normal range of motion and neck supple.  Skin:    General: Skin is warm.  Neurological:     Mental Status: She is alert and oriented to person, place, and time.  Psychiatric:        Mood and Affect: Affect normal.   LABORATORY DATA:  I have reviewed the data as listed Lab Results  Component Value Date   WBC 4.2 04/23/2021   HGB 12.1 04/23/2021   HCT 36.5 04/23/2021   MCV 88.8 04/23/2021   PLT 219.0 04/23/2021   Recent Labs    04/23/21 1038  NA 140  K 4.3  CL 105  CO2 28  GLUCOSE 97  BUN 11  CREATININE 0.78  CALCIUM 9.7  PROT 7.1  ALBUMIN 4.4  AST 18  ALT 19  ALKPHOS  40  BILITOT 0.4    RADIOGRAPHIC STUDIES: I have personally reviewed the radiological images as listed and agreed with the findings in the report. DG Bone Density  Result Date: 07/02/2021 EXAM: DUAL X-RAY ABSORPTIOMETRY (DXA) FOR BONE MINERAL DENSITY IMPRESSION: Your patient Brittney Johnson completed a BMD  test on 07/02/2021 using the [place] (software version: 14.10) manufactured by [employer]. The following summarizes the results of our evaluation. Technologist: SCE PATIENT BIOGRAPHICAL: Name: Brittney Johnson, Brittney Johnson Patient ID: [id number] Birth Date: 1956-01-16 Height: 64.5 in. Gender: Female Exam Date: 07/02/2021 Weight: 163.2 lbs. Indications: Caucasian, History of Breast Cancer, History of Fracture (Adult), History of Radiation, History of Spinal Surgery, Hysterectomy, Oophorectomy Unilateral, Postmenopausal, Vitamin D Deficiency Fractures: Toes, Right hand Treatments: Albuterol, Anastrozole, calcium w/ vit D, Multi-Vitamin DENSITOMETRY RESULTS: Site      Region     Measured Date Measured Age WHO Classification Young Adult T-score BMD         %Change vs. Previous Significant Change (*) AP Spine L1-L4 07/02/2019 67.6 Normal 1.3 1.350 g/cm2 - - DualFemur Neck Right 07/02/2021 67.6 Osteopenia -1.4 0.847 g/cm2 -4.3% - DualFemur Neck Right 05/10/2019 65.5 Osteopenia -1.1 0.885 g/cm2 - - DualFemur Total Mean 07/02/2021 67.6 Normal -0.3 0.974 g/cm2 1.0% - DualFemur Total Mean 05/10/2019 65.5 Normal -0.3 0.964 g/cm2 - - ASSESSMENT: The BMD measured at Femur Neck Right is 0.847 g/cm2 with a T-score of -1.4. This patient is considered osteopenic according to [facility]) criteria. The scan quality is good. Compared with prior study, there has been no significant change in the total hip. World Pharmacologist [facility]) criteria for post-menopausal, Caucasian Women: Normal:                   T-score at or above -1 SD Osteopenia/low bone mass: T-score between -1 and -2.5 SD Osteoporosis:             T-score at or below -2.5 SD RECOMMENDATIONS: 1. All patients should optimize calcium and vitamin D intake. 2. Consider FDA-approved medical therapies in postmenopausal women and men aged 43 years and older, based on the following: a. A hip or vertebral(clinical or morphometric) fracture b.  T-score < -2.5 at the femoral neck or spine after appropriate evaluation to exclude secondary causes c. Low bone mass (T-score between -1.0 and -2.5 at the femoral neck or spine) and a 10-year probability of a hip fracture > 3% or a 10-year probability of a major osteoporosis-related fracture > 20% based on the [place]-adapted WHO algorithm 3. Clinician judgment and/or patient preferences may indicate treatment for people with 10-year fracture probabilities above or below these levels FOLLOW-UP: People with diagnosed cases of osteoporosis or at high risk for fracture should have regular bone mineral density tests. For patients eligible for Medicare, routine testing is allowed once every 2 years. The testing frequency can be increased to one year for patients who have rapidly progressing disease, those who are receiving or discontinuing medical therapy to restore bone mass, or have additional risk factors. I have reviewed this report, and agree with the above findings. [facility] Dear Dr Rogue Bussing, Your patient Brittney Johnson completed a FRAX assessment on 07/02/2021 using the [place] (analysis version: 14.10) manufactured by [employer]. The following summarizes the results of our evaluation. PATIENT BIOGRAPHICAL: Name: Brittney Johnson, Brittney Johnson Patient ID: [id number] Birth Date: 09-21-1955 Height:    64.5 in. Gender:     [age]    [age]       Weight:    163.2 lbs. Ethnicity:  White                            Exam Date: 07/02/2021 FRAX* RESULTS:  (version: 3.5) 10-year Probability of Fracture1 Major Osteoporotic Fracture2 Hip Fracture 14.9% 1.6% Population: [place] (Caucasian) Risk Factors: History of Fracture (Adult) Based on Femur (Right) Neck BMD 1 -The 10-year probability of fracture may be lower than reported if the patient has received treatment. 2 -Major Osteoporotic Fracture: Clinical Spine, Forearm, Hip or Shoulder *FRAX is a Materials engineer of the [employer] for Metabolic Bone Disease, a [place] (WHO) [facility]. ASSESSMENT: The probability of a major osteoporotic fracture is 14.9% within the next ten years. The probability of a hip fracture is 1.6% within the next ten years. . Electronically Signed   By: Elmer Picker M.D.   On: 07/02/2021 09:48    ASSESSMENT & PLAN:   Carcinoma of upper-inner quadrant of left breast in female, estrogen receptor positive ([place]) #Left breast cancer stage I ER/PR positive HER-2 negative; s/p lumpectomy; on adjuvant anastrozole [until 2026-fall ] mammogram Oct 2022-Dr. Cintron. STABLE  #On anastrozole tolerating well.  No major side effects noted except for MSK [see below]Continue anastrozole for 5 years.  #Osteopenia-reviewed OCT 2022- T score+ -1.4-osteopenia [stable from 2020]; again reviewed the potential risk factors/and other treatment options including but not limited to bisphosphonates/Reclast.  Patient physically active/exercising- on ca+vit D.  Given the stability of the bone density recommend continued monitoring.  #Right hand pain/joint pains-likely secondary recent Dupuytren contracture surgery/healing rather than MSK from anastrozole.  # DISPOSITION:mychart # Follow-up in 6 months-MD; no labs; -  Dr. Earley Abide, MD 07/04/2021 3:16 PM

## 2021-07-04 NOTE — Progress Notes (Signed)
Pt in for follow up reports had covid 3 weeks ago but states is doing well.  Pt had a fall over the weekend putting up christmas decorations and fell off of step ladder.  Pt has bruise on left side and states is very sore.

## 2021-07-04 NOTE — Assessment & Plan Note (Addendum)
#  Left breast cancer stage I ER/PR positive HER-2 negative; s/p lumpectomy; on adjuvant anastrozole [until 2026-fall ] mammogram Oct 2022-Dr. Cintron. STABLE  #On anastrozole tolerating well.  No major side effects noted except for MSK [see below]Continue anastrozole for 5 years.  #Osteopenia-reviewed OCT 2022- T score+ -1.4-osteopenia [stable from 2020]; again reviewed the potential risk factors/and other treatment options including but not limited to bisphosphonates/Reclast.  Patient physically active/exercising- on ca+vit D.  Given the stability of the bone density recommend continued monitoring.  #Right hand pain/joint pains-likely secondary recent Dupuytren contracture surgery/healing rather than MSK from anastrozole.  # DISPOSITION:mychart # Follow-up in 6 months-MD; no labs; -  Dr. Jacinto Reap.

## 2021-07-12 DIAGNOSIS — Z4789 Encounter for other orthopedic aftercare: Secondary | ICD-10-CM | POA: Diagnosis not present

## 2021-07-12 DIAGNOSIS — M25641 Stiffness of right hand, not elsewhere classified: Secondary | ICD-10-CM | POA: Diagnosis not present

## 2021-07-12 DIAGNOSIS — M720 Palmar fascial fibromatosis [Dupuytren]: Secondary | ICD-10-CM | POA: Diagnosis not present

## 2021-09-11 ENCOUNTER — Other Ambulatory Visit: Payer: Self-pay | Admitting: Orthopedic Surgery

## 2021-09-11 DIAGNOSIS — M654 Radial styloid tenosynovitis [de Quervain]: Secondary | ICD-10-CM | POA: Diagnosis not present

## 2021-09-11 DIAGNOSIS — M720 Palmar fascial fibromatosis [Dupuytren]: Secondary | ICD-10-CM | POA: Diagnosis not present

## 2021-09-11 DIAGNOSIS — G8918 Other acute postprocedural pain: Secondary | ICD-10-CM | POA: Diagnosis not present

## 2021-09-11 DIAGNOSIS — L905 Scar conditions and fibrosis of skin: Secondary | ICD-10-CM | POA: Diagnosis not present

## 2021-09-18 DIAGNOSIS — M25641 Stiffness of right hand, not elsewhere classified: Secondary | ICD-10-CM | POA: Diagnosis not present

## 2021-09-24 DIAGNOSIS — M25641 Stiffness of right hand, not elsewhere classified: Secondary | ICD-10-CM | POA: Diagnosis not present

## 2021-09-28 DIAGNOSIS — M25641 Stiffness of right hand, not elsewhere classified: Secondary | ICD-10-CM | POA: Diagnosis not present

## 2021-10-01 DIAGNOSIS — H25813 Combined forms of age-related cataract, bilateral: Secondary | ICD-10-CM | POA: Diagnosis not present

## 2021-10-02 DIAGNOSIS — M25641 Stiffness of right hand, not elsewhere classified: Secondary | ICD-10-CM | POA: Diagnosis not present

## 2021-10-07 IMAGING — US US BREAST*L* LIMITED INC AXILLA
1 series · 10 of 10 positions shown · non-contrast
Comparison: Previous exams.

CLINICAL DATA: Screening recall for possible left breast asymmetry.

EXAM:
DIGITAL DIAGNOSTIC UNILATERAL LEFT MAMMOGRAM WITH TOMO AND CAD;
ULTRASOUND LEFT BREAST LIMITED

[Series 1: us breast*left* limited inc axilla · 0.06mm/px · 10 of 10 slices shown]
[im 1/10]
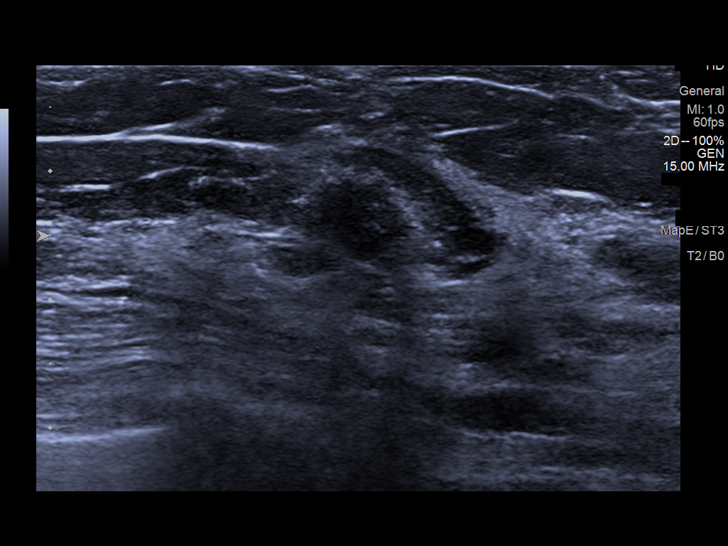
[im 2/10]
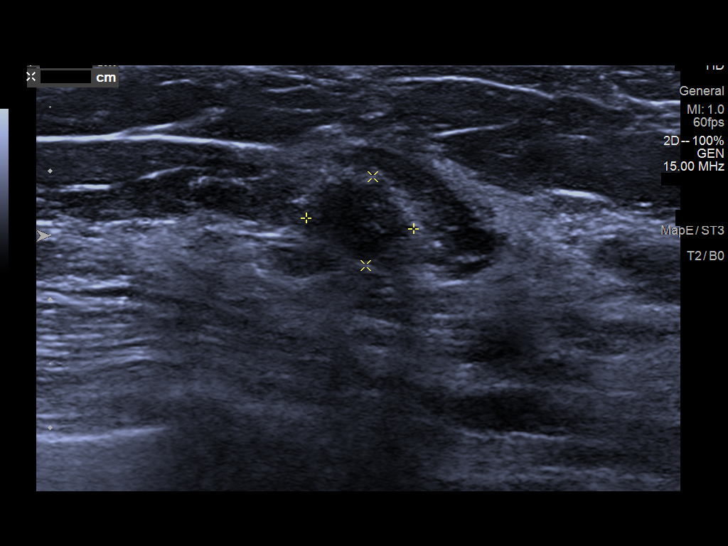
[im 3/10]
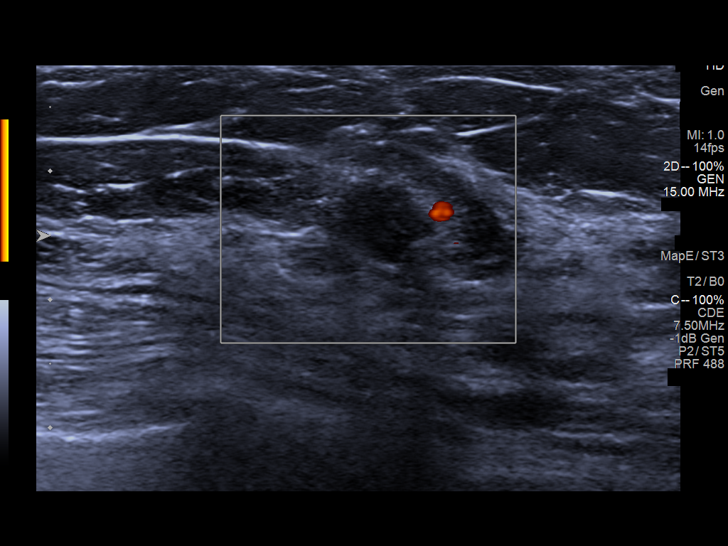
[im 4/10]
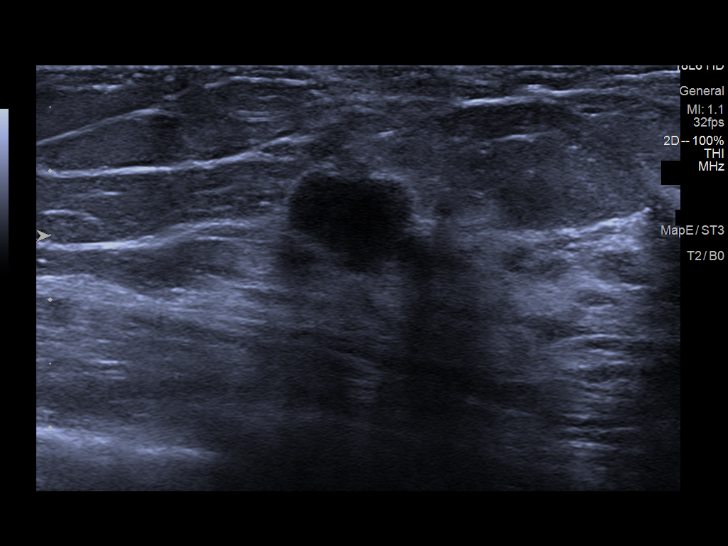
[im 5/10]
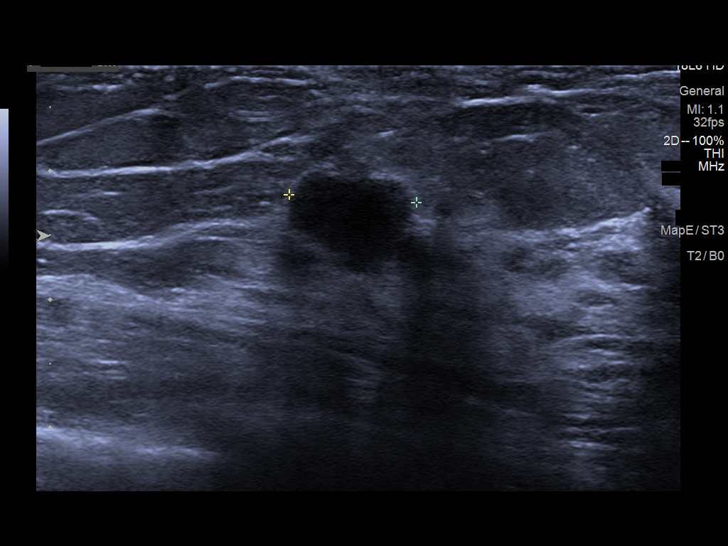
[im 6/10]
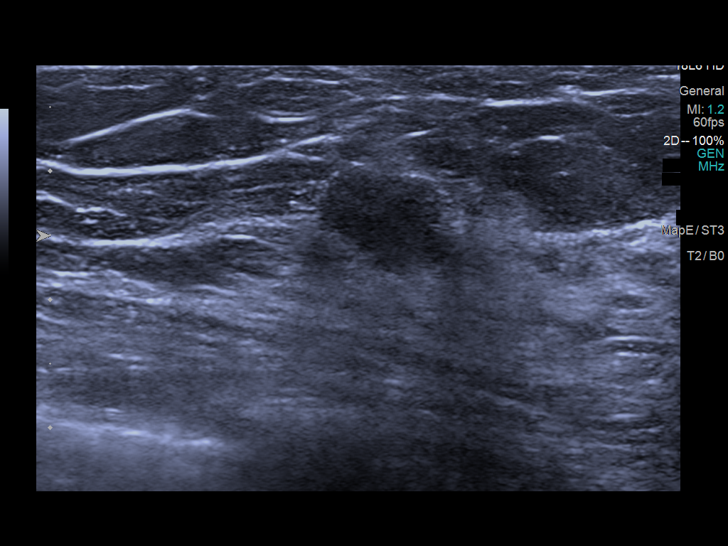
[im 7/10]
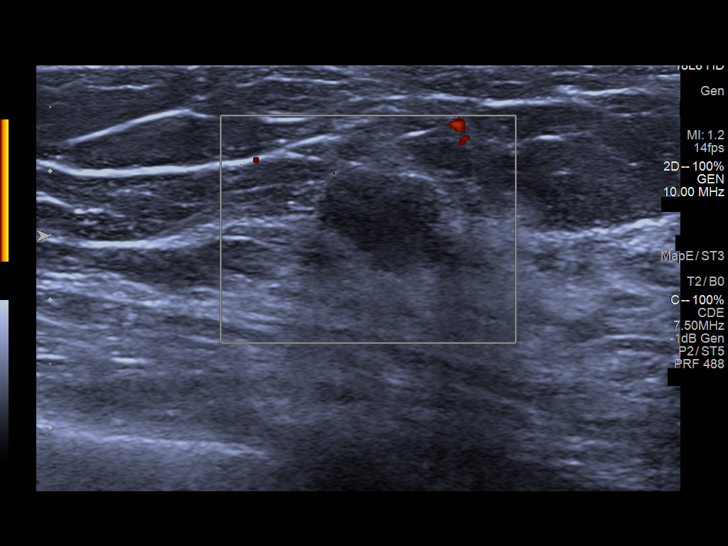
[im 8/10]
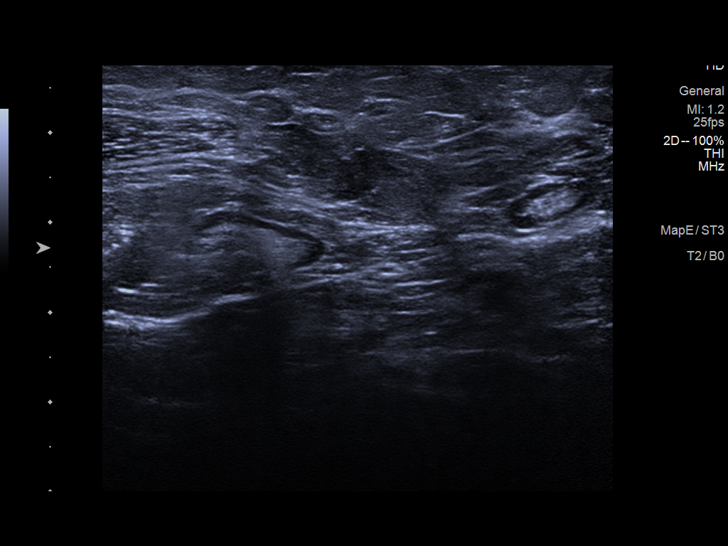
[im 9/10]
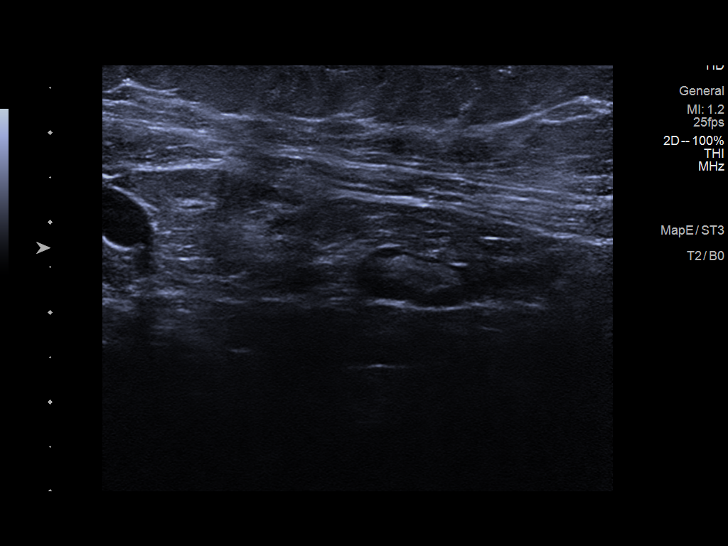
[im 10/10]
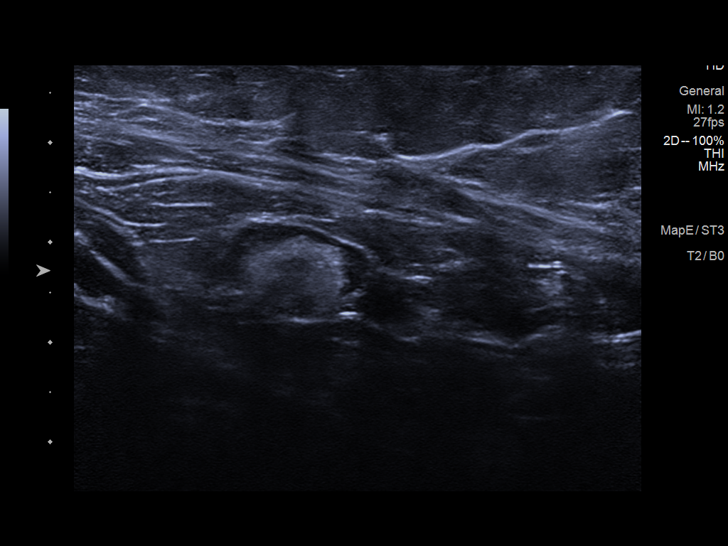

[10 of 10 positions shown; findings below may reference images not displayed]

ACR Breast Density Category c: The breast tissue is heterogeneously
dense, which may obscure small masses.
FINDINGS: Additional tomograms were performed of the left breast. There is a
mass with obscured margins in the upper slightly inner left breast.

Mammographic images were processed with CAD.

Targeted ultrasound of the left breast was performed. There is a
hypoechoic mass with margin irregularity in the left breast at 10
o'clock 6 cm from nipple measuring 0.8 x 0.7 x 1 cm. This is felt to
correspond with the mass seen in the upper inner left breast at
mammography. No lymphadenopathy seen in the left axilla.
IMPRESSION: Suspicious 1 cm mass in the left breast at the 10 o'clock position.

RECOMMENDATION:
Recommend ultrasound-guided biopsy of the mass in the left breast at
the 10 o'clock position.

I have discussed the findings and recommendations with the patient.
If applicable, a reminder letter will be sent to the patient
regarding the next appointment.

BI-RADS CATEGORY  4: Suspicious.

## 2021-10-07 IMAGING — MG MM DIGITAL DIAGNOSTIC UNILAT*L* W/ TOMO W/ CAD
4 series · 4 of 12 positions shown · non-contrast
Comparison: Previous exams.

CLINICAL DATA: Screening recall for possible left breast asymmetry.

EXAM:
DIGITAL DIAGNOSTIC UNILATERAL LEFT MAMMOGRAM WITH TOMO AND CAD;
ULTRASOUND LEFT BREAST LIMITED

[L ML synth-2D]
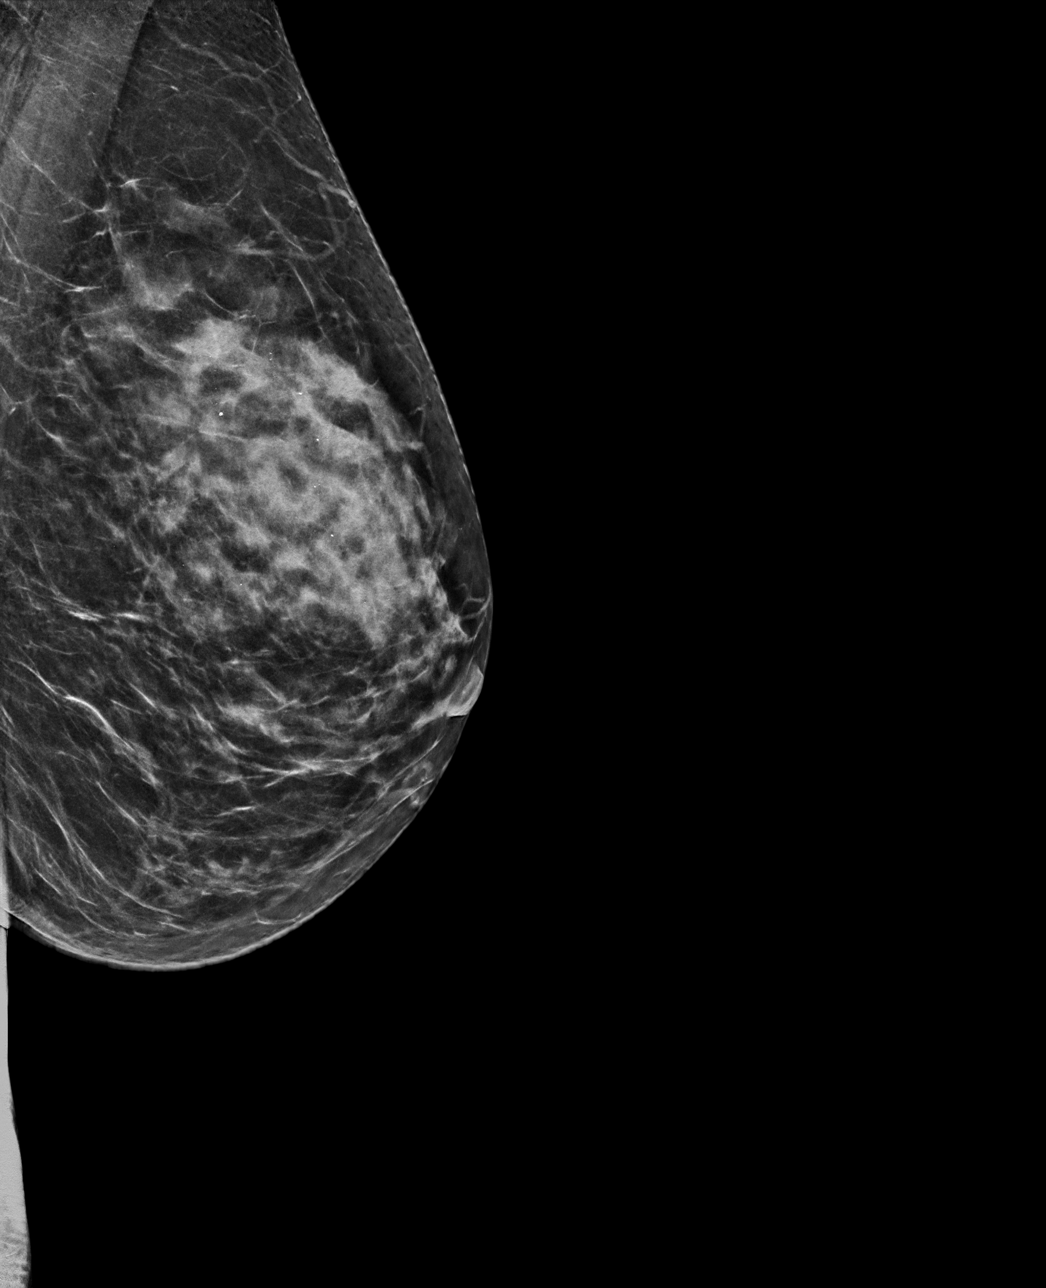

[L MLO synth-2D]
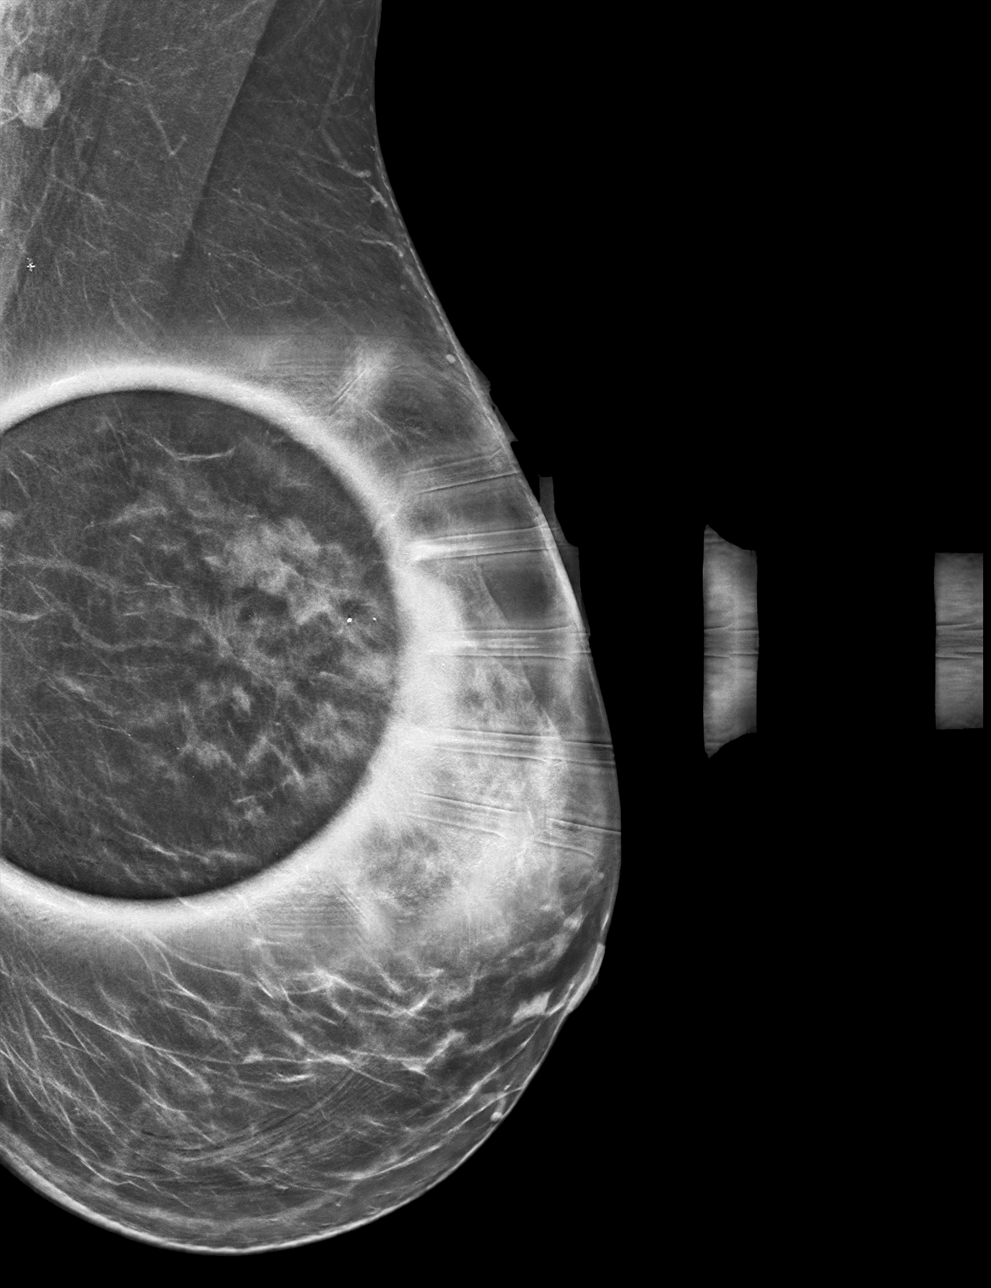

[L ML tomo · tomo slice 33/65.0]
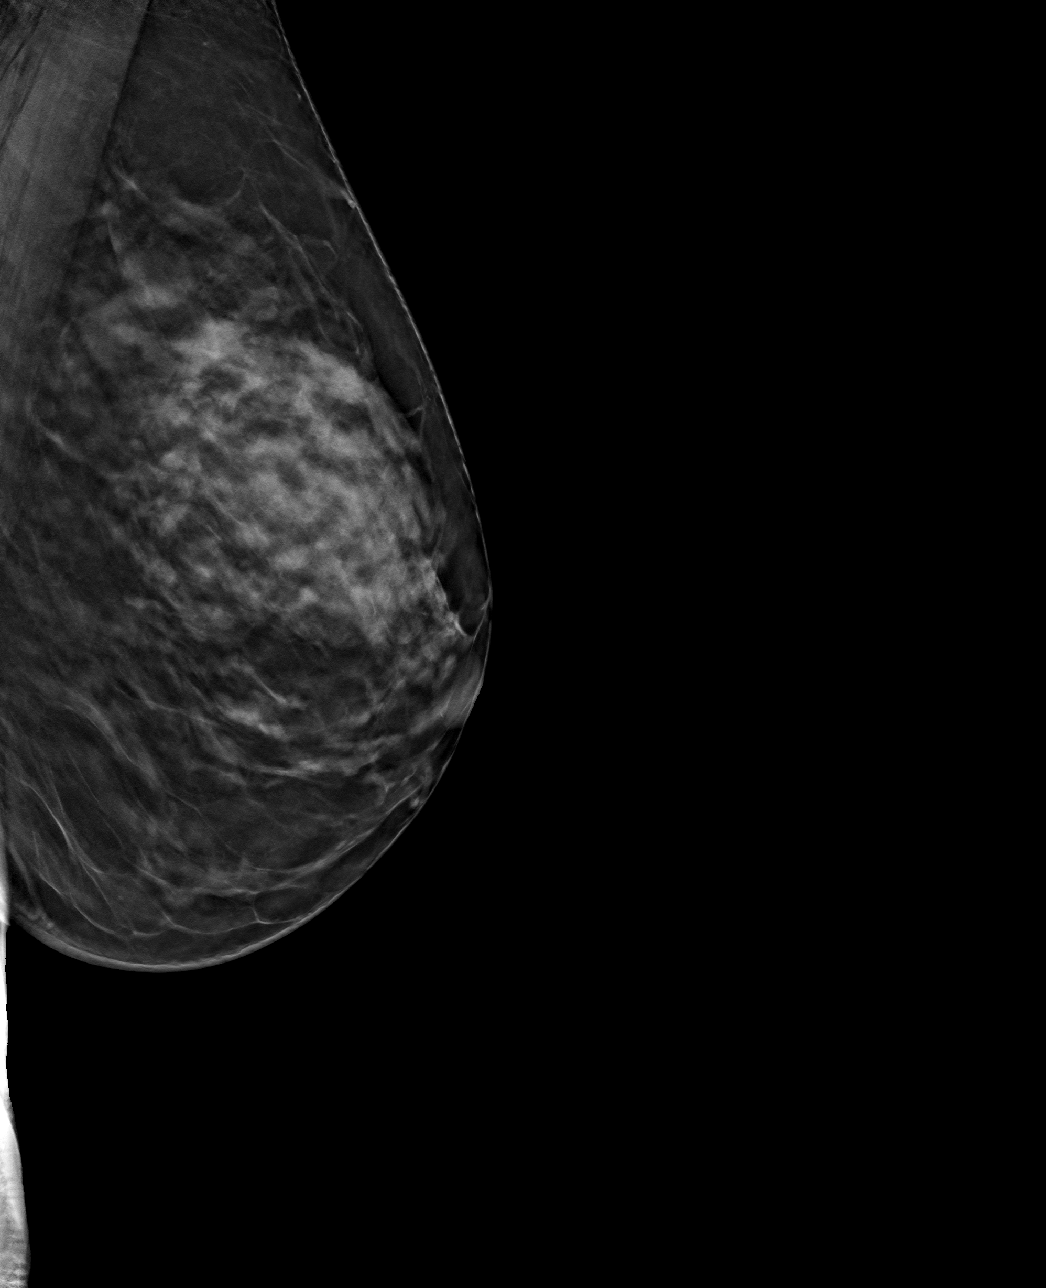

[L MLO tomo · tomo slice 33/65.0]
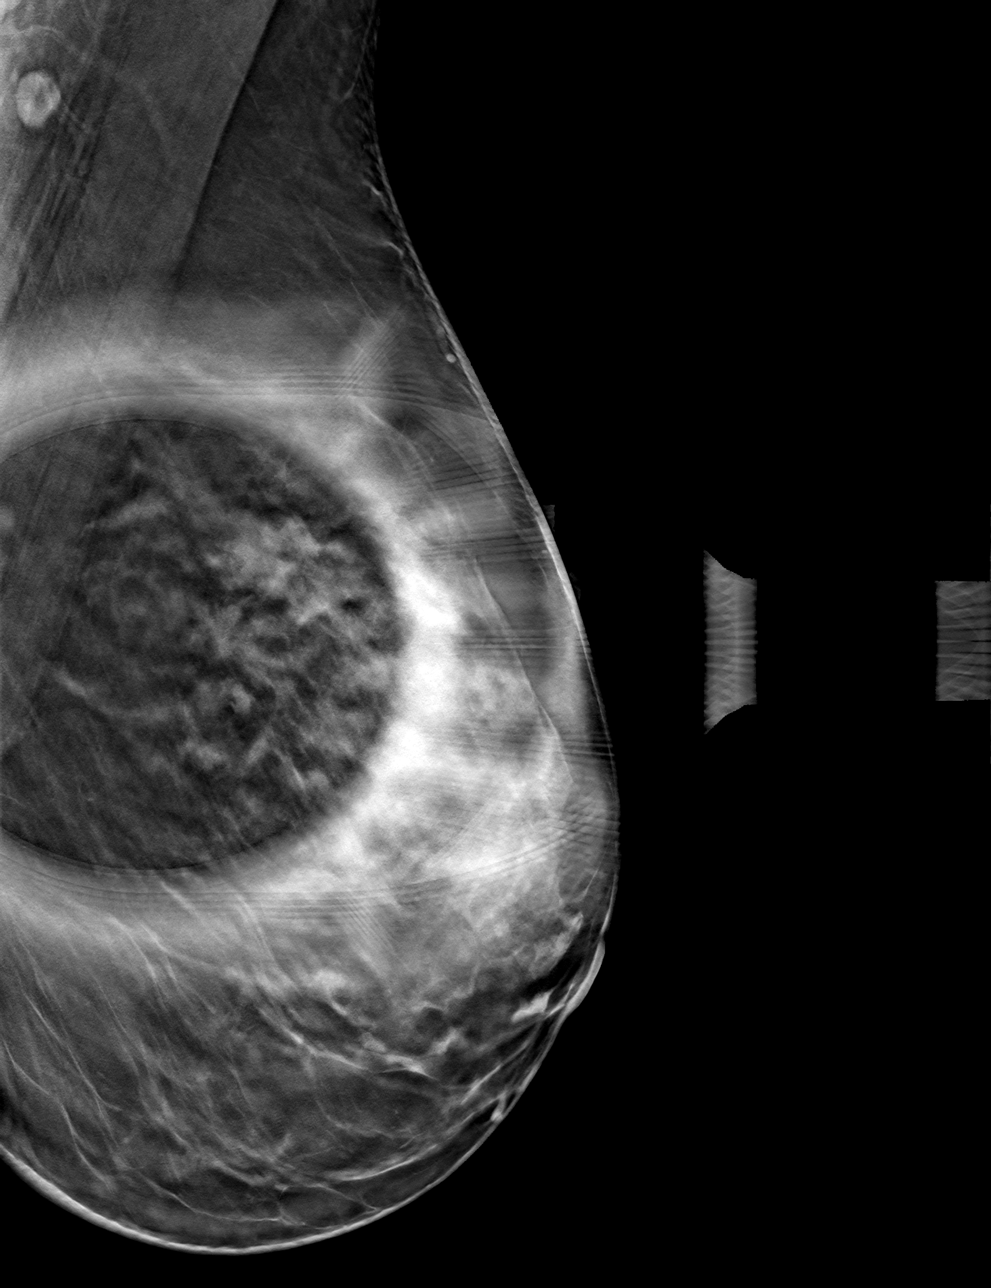

[4 of 12 positions shown; findings below may reference images not displayed]

ACR Breast Density Category c: The breast tissue is heterogeneously
dense, which may obscure small masses.
FINDINGS: Additional tomograms were performed of the left breast. There is a
mass with obscured margins in the upper slightly inner left breast.

Mammographic images were processed with CAD.

Targeted ultrasound of the left breast was performed. There is a
hypoechoic mass with margin irregularity in the left breast at 10
o'clock 6 cm from nipple measuring 0.8 x 0.7 x 1 cm. This is felt to
correspond with the mass seen in the upper inner left breast at
mammography. No lymphadenopathy seen in the left axilla.
IMPRESSION: Suspicious 1 cm mass in the left breast at the 10 o'clock position.

RECOMMENDATION:
Recommend ultrasound-guided biopsy of the mass in the left breast at
the 10 o'clock position.

I have discussed the findings and recommendations with the patient.
If applicable, a reminder letter will be sent to the patient
regarding the next appointment.

BI-RADS CATEGORY  4: Suspicious.

## 2021-10-09 DIAGNOSIS — M25641 Stiffness of right hand, not elsewhere classified: Secondary | ICD-10-CM | POA: Diagnosis not present

## 2021-10-16 DIAGNOSIS — M25641 Stiffness of right hand, not elsewhere classified: Secondary | ICD-10-CM | POA: Diagnosis not present

## 2021-10-17 ENCOUNTER — Other Ambulatory Visit: Payer: Self-pay

## 2021-10-17 ENCOUNTER — Encounter: Payer: Self-pay | Admitting: Radiation Oncology

## 2021-10-17 ENCOUNTER — Ambulatory Visit
Admission: RE | Admit: 2021-10-17 | Discharge: 2021-10-17 | Disposition: A | Discharge status: Home / Self Care | Payer: BC Managed Care – PPO | Source: Ambulatory Visit | Attending: Radiation Oncology | Admitting: Radiation Oncology

## 2021-10-17 ENCOUNTER — Other Ambulatory Visit: Payer: Self-pay | Admitting: Internal Medicine

## 2021-10-17 VITALS — BP 138/72 | Resp 18 | Ht 64.0 in | Wt 160.7 lb

## 2021-10-17 DIAGNOSIS — Z08 Encounter for follow-up examination after completed treatment for malignant neoplasm: Secondary | ICD-10-CM | POA: Diagnosis not present

## 2021-10-17 DIAGNOSIS — Z79811 Long term (current) use of aromatase inhibitors: Secondary | ICD-10-CM | POA: Insufficient documentation

## 2021-10-17 DIAGNOSIS — Z170 Estrogen receptor positive status [ER+]: Secondary | ICD-10-CM | POA: Diagnosis not present

## 2021-10-17 DIAGNOSIS — C50212 Malignant neoplasm of upper-inner quadrant of left female breast: Secondary | ICD-10-CM

## 2021-10-17 DIAGNOSIS — Z923 Personal history of irradiation: Secondary | ICD-10-CM | POA: Insufficient documentation

## 2021-10-17 NOTE — Progress Notes (Signed)
Radiation Oncology ?Follow up Note ? ?Name: Brittney Johnson   ?Date:   10/17/2021 ?MRN:  [id number] ?DOB: [birth year]  ? ? ?This 66 y.o. female presents to the clinic today for 36-monthfollow-up status post whole breast radiation to her left breast for stage I ER/PR positive invasive mammary carcinoma with micropapillary features. ? ?REFERRING PROVIDER: Tower, MWynelle Fanny MD ? ?HPI: Patient is a 66year old female now at 16 months having completed whole breast radiation to her left breast for stage Ia ER/PR positive invasive mammary carcinoma.  Seen today in routine follow-up she is doing well.  She specifically denies breast tenderness cough or bone pain.  She is currently on.  Arimidex tolerating that well without side effect.  She had a mammogram back in October which I have reviewed was BI-RADS 2 benign. ? ?COMPLICATIONS OF TREATMENT: none ? ?FOLLOW UP COMPLIANCE: keeps appointments  ? ?PHYSICAL EXAM:  ?BP 138/72 (BP Location: Right Arm, Patient Position: Sitting)   Resp 18   Ht '5\' 4"'$  (1.626 m)   Wt 160 lb 11.2 oz (72.9 kg)   LMP 07/30/2003   BMI 27.58 kg/m?  ?Lungs are clear to A&P cardiac examination essentially unremarkable with regular rate and rhythm. No dominant mass or nodularity is noted in either breast in 2 positions examined. Incision is well-healed. No axillary or supraclavicular adenopathy is appreciated. Cosmetic result is excellent.  Well-developed well-nourished patient in NAD. HEENT reveals PERLA, EOMI, discs not visualized.  Oral cavity is clear. No oral mucosal lesions are identified. Neck is clear without evidence of cervical or supraclavicular adenopathy. Lungs are clear to A&P. Cardiac examination is essentially unremarkable with regular rate and rhythm without murmur rub or thrill. Abdomen is benign with no organomegaly or masses noted. Motor sensory and DTR levels are equal and symmetric in the upper and lower extremities. Cranial nerves II through XII are grossly intact. Proprioception  is intact. No peripheral adenopathy or edema is identified. No motor or sensory levels are noted. Crude visual fields are within normal range. ? ?RADIOLOGY RESULTS: Mammograms reviewed compatible with above-stated findings ? ?PLAN: Present time patient is doing well with no evidence of disease now out 16 months from whole breast radiation and pleased with her overall progress.  Of asked to see her back in 1 year for follow-up.  She continues on Arimidex without side effect.  Patient knows to call with any concerns. ? ?I would like to take this opportunity to thank you for allowing me to participate in the care of your patient.. ?  ? GNoreene Filbert MD ? ?

## 2021-10-23 DIAGNOSIS — M25641 Stiffness of right hand, not elsewhere classified: Secondary | ICD-10-CM | POA: Diagnosis not present

## 2021-10-27 IMAGING — MR MR BREAST BILAT WO/W CM
2 of 10 series · 8 of 48 positions shown · IV contrast (6ml Gadavist)
Comparison: Previous mammogram, ultrasound and biopsy examinations.

CLINICAL DATA: Recently diagnosed invasive mammary carcinoma in the
10 o'clock position of the left breast. MRI recommended due to
heterogeneously dense breast parenchyma.

LABS:  None obtained on site today.
EXAM:
BILATERAL BREAST MRI WITH AND WITHOUT CONTRAST
TECHNIQUE: Multiplanar, multisequence MR images of both breasts were obtained
prior to and following the intravenous administration of 6 ml of
Gadavist

[Series 3: T1 · axial · B · 1.5mm · 1.02mm/px · z∈[-82,+84]mm · 6 of 112 slices shown]
[im 1/112]
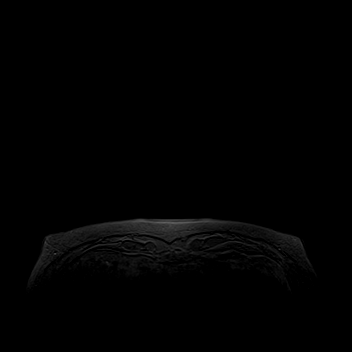
[im 23/112]
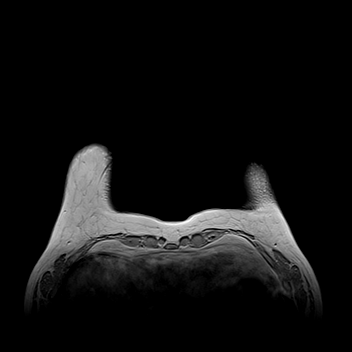
[im 45/112]
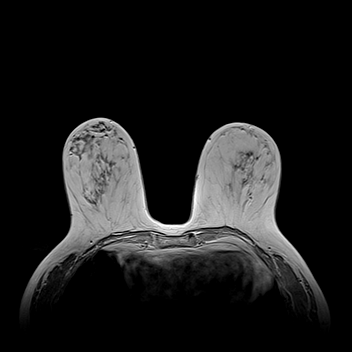
[im 67/112]
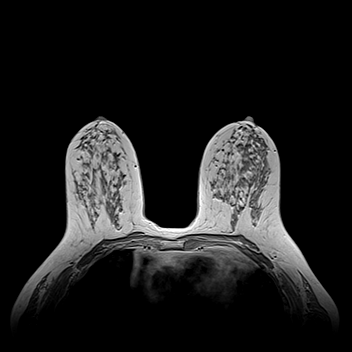
[im 89/112]
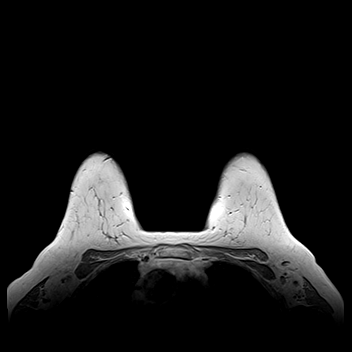
[im 112/112]
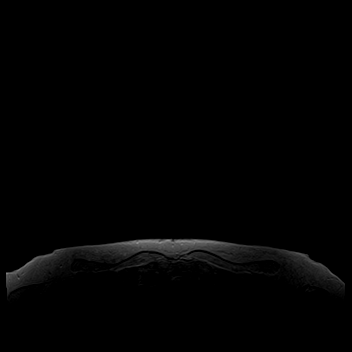

[Series 4: T2 · axial · B · 3.0mm · 1.02mm/px · z∈[-80,+82]mm · 2 of 46 slices shown]
[im 1/46]
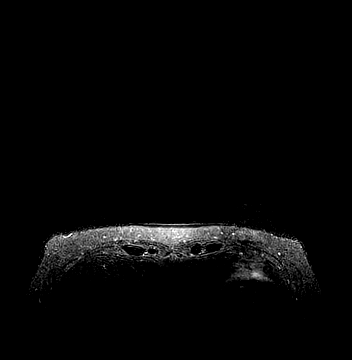
[im 46/46]
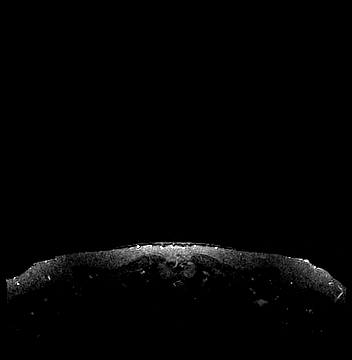

[8 of 48 positions shown; findings below may reference images not displayed]

Three-dimensional MR images were rendered by post-processing of the
original MR data on an independent workstation. The
three-dimensional MR images were interpreted, and findings are
reported in the following complete MRI report for this study. Three
dimensional images were evaluated at the independent [employer]
workstation
FINDINGS: Breast composition: c. Heterogeneous fibroglandular tissue.

Background parenchymal enhancement: Minimal

Right breast: No mass or abnormal enhancement.

Left breast: Biopsy marker clip artifact in the recently biopsied
mass in the posterior 10 o'clock position of the left breast. The
mass measures 1.9 x 1.0 cm on image number 77 series 12 and 1.2 cm
in cephalocaudal dimension. This has a mixture of enhancement
kinetics, including rapid wash-in/washout.

Lymph nodes: No abnormal appearing lymph nodes.

Ancillary findings: There is a 6 mm oval, enhancing mass in the
sternum on the left on image number 45 series 16. This has high
signal intensity on the T2 inversion recovery image and intermediate
signal intensity on the non fat saturation T1 weighted images,
similar to adjacent marrow.
IMPRESSION: 1. 1.9 x 1.2 x 1.0 cm biopsy-proven malignancy in the 10 o'clock
position of the left breast.
2. No evidence of malignancy elsewhere in either breast and no
adenopathy.
3. 6 mm indeterminate mass in the sternum on the left. This most
likely represents a hemangioma.

RECOMMENDATION:
1. Treatment plan for known malignancy in the 10 o'clock position of
the left breast.
2. Consider a radionuclide bone scan to exclude bony metastatic
disease.

BI-RADS CATEGORY  6: Known biopsy-proven malignancy.

## 2021-10-30 DIAGNOSIS — M25641 Stiffness of right hand, not elsewhere classified: Secondary | ICD-10-CM | POA: Diagnosis not present

## 2021-11-05 DIAGNOSIS — M25641 Stiffness of right hand, not elsewhere classified: Secondary | ICD-10-CM | POA: Diagnosis not present

## 2021-11-13 DIAGNOSIS — M25641 Stiffness of right hand, not elsewhere classified: Secondary | ICD-10-CM | POA: Diagnosis not present

## 2021-11-20 DIAGNOSIS — M25641 Stiffness of right hand, not elsewhere classified: Secondary | ICD-10-CM | POA: Diagnosis not present

## 2021-11-27 DIAGNOSIS — M25641 Stiffness of right hand, not elsewhere classified: Secondary | ICD-10-CM | POA: Diagnosis not present

## 2021-12-03 DIAGNOSIS — M25641 Stiffness of right hand, not elsewhere classified: Secondary | ICD-10-CM | POA: Diagnosis not present

## 2021-12-13 DIAGNOSIS — M25641 Stiffness of right hand, not elsewhere classified: Secondary | ICD-10-CM | POA: Diagnosis not present

## 2021-12-18 DIAGNOSIS — M25641 Stiffness of right hand, not elsewhere classified: Secondary | ICD-10-CM | POA: Diagnosis not present

## 2022-01-01 ENCOUNTER — Inpatient Hospital Stay: Payer: BC Managed Care – PPO | Admitting: Internal Medicine

## 2022-01-07 ENCOUNTER — Ambulatory Visit: Payer: BC Managed Care – PPO | Admitting: Internal Medicine

## 2022-01-08 DIAGNOSIS — M25641 Stiffness of right hand, not elsewhere classified: Secondary | ICD-10-CM | POA: Diagnosis not present

## 2022-01-11 ENCOUNTER — Inpatient Hospital Stay: Payer: BC Managed Care – PPO | Attending: Internal Medicine | Admitting: Internal Medicine

## 2022-01-11 ENCOUNTER — Encounter: Payer: Self-pay | Admitting: Internal Medicine

## 2022-01-11 DIAGNOSIS — Z87891 Personal history of nicotine dependence: Secondary | ICD-10-CM | POA: Insufficient documentation

## 2022-01-11 DIAGNOSIS — Z90710 Acquired absence of both cervix and uterus: Secondary | ICD-10-CM | POA: Insufficient documentation

## 2022-01-11 DIAGNOSIS — Z801 Family history of malignant neoplasm of trachea, bronchus and lung: Secondary | ICD-10-CM | POA: Diagnosis not present

## 2022-01-11 DIAGNOSIS — Z803 Family history of malignant neoplasm of breast: Secondary | ICD-10-CM | POA: Diagnosis not present

## 2022-01-11 DIAGNOSIS — Z170 Estrogen receptor positive status [ER+]: Secondary | ICD-10-CM | POA: Diagnosis not present

## 2022-01-11 DIAGNOSIS — C50212 Malignant neoplasm of upper-inner quadrant of left female breast: Secondary | ICD-10-CM | POA: Diagnosis not present

## 2022-01-11 DIAGNOSIS — M8580 Other specified disorders of bone density and structure, unspecified site: Secondary | ICD-10-CM | POA: Insufficient documentation

## 2022-01-11 DIAGNOSIS — I10 Essential (primary) hypertension: Secondary | ICD-10-CM | POA: Diagnosis not present

## 2022-01-11 MED ORDER — EXEMESTANE 25 MG PO TABS: 25.0000 mg | ORAL_TABLET | Freq: Every day | ORAL | 1 refills | Status: DC

## 2022-01-11 NOTE — Progress Notes (Signed)
one [facility] NOTE  Patient Care Team: Tower, Wynelle Fanny, MD as PCP - General Noreene Filbert, MD as Referring Physician (Radiation Oncology) Rico Junker, RN as Oncology Nurse Navigator Herbert Pun, MD as Consulting Physician (General Surgery) Cammie Sickle, MD as Consulting Physician (Internal Medicine)  CHIEF COMPLAINTS/PURPOSE OF CONSULTATION: Breast cancer  #  Oncology History Overview Note  # LEFT BREAST CA- INVASIVE MAMMARY CARCINOMA, NO SPECIAL TYPE, WITH MICROPAPILLARY  FEATURES; ER/PR Positive [90%]; Her 2 NEG; G-1; 0.8 x 0.7 x 1 cm.  Oncotype recurrence score= 0; 3% distant recurrence; no chemotherapy; adjuvant radiation;  s/p RT [nov 5th, 2021];   # NOV end 221- START Anastrazole[previous HRT*]; JUNE 16th, 2023- Discontinue anastrozole; switch over to exemestane given patient's MSK side effects  # # SURVIVORSHIP:   # GENETICS: No pathogenic variants; VUS*  DIAGNOSIS: Cure  STAGE: 1        ;  GOALS: Cure  CURRENT/MOST RECENT THERAPY :     Carcinoma of upper-inner quadrant of left breast in female, estrogen receptor positive ([place])  03/01/2020 Initial Diagnosis   Carcinoma of upper-inner quadrant of left breast in female, estrogen receptor positive ([place])    Genetic Testing   Negative genetic testing. No pathogenic variants identified on the [facility] Multi-Cancer Panel. VUS in AIP called c.790_792del and VUS in RET called c.1163T>C identified. The report date is 04/07/2020.  The Multi-Cancer Panel offered by [facility] includes sequencing and/or deletion duplication testing of the following 85 genes: AIP, ALK, APC, ATM, AXIN2,BAP1,  BARD1, BLM, BMPR1A, BRCA1, BRCA2, BRIP1, CASR, CDC73, CDH1, CDK4, CDKN1B, CDKN1C, CDKN2A (p14ARF), CDKN2A (p16INK4a), CEBPA, CHEK2, CTNNA1, DICER1, DIS3L2, EGFR (c.2369C>T, p.Thr790Met variant only), EPCAM (Deletion/duplication testing only), FH, FLCN, GATA2, GPC3, GREM1 (Promoter region deletion/duplication  testing only), HOXB13 (c.251G>A, p.Gly84Glu), HRAS, KIT, MAX, MEN1, MET, MITF (c.952G>A, p.Glu318Lys variant only), MLH1, MSH2, MSH3, MSH6, MUTYH, NBN, NF1, NF2, NTHL1, PALB2, PDGFRA, PHOX2B, PMS2, POLD1, POLE, POT1, PRKAR1A, PTCH1, PTEN, RAD50, RAD51C, RAD51D, RB1, RECQL4, RET, RNF43, RUNX1, SDHAF2, SDHA (sequence changes only), SDHB, SDHC, SDHD, SMAD4, SMARCA4, SMARCB1, SMARCE1, STK11, SUFU, TERC, TERT, TMEM127, TP53, TSC1, TSC2, VHL, WRN and WT1.       HISTORY OF PRESENTING ILLNESS:  Brittney Johnson 66 y.o.  female diagnosis of stage I ER/PR positive HER-2 negative breast cancer on anastrozole is here for follow-up/.  Patient continues to complains of pain in her hand lower extremity joints.  However had recent Dupuytren contracture surgery-bilateral hands.  Denies any nausea vomiting abdominal pain.  No fever no chills.  Review of Systems  Constitutional:  Negative for chills, diaphoresis, fever, malaise/fatigue and weight loss.  HENT:  Negative for nosebleeds and sore throat.   Eyes:  Negative for double vision.  Respiratory:  Negative for cough, hemoptysis, sputum production, shortness of breath and wheezing.   Cardiovascular:  Negative for chest pain, palpitations, orthopnea and leg swelling.  Gastrointestinal:  Negative for abdominal pain, blood in stool, constipation, diarrhea, heartburn, melena, nausea and vomiting.  Genitourinary:  Negative for dysuria, frequency and urgency.  Musculoskeletal:  Positive for joint pain and myalgias. Negative for back pain.  Skin: Negative.  Negative for itching and rash.  Neurological:  Negative for dizziness, tingling, focal weakness, weakness and headaches.  Endo/Heme/Allergies:  Does not bruise/bleed easily.  Psychiatric/Behavioral:  Negative for depression. The patient is not nervous/anxious and does not have insomnia.      MEDICAL HISTORY:  Past Medical History:  Diagnosis Date  . Atypical pneumonia 1998  not hosp  . Breast cancer  ([place]) 2021   left-sx, radation completed  . DDD (degenerative disc disease), cervical   . Family history of breast cancer   . Family history of lung cancer   . Hyperlipidemia   . Hypertension   . Low back pain    on and off  . PONV (postoperative nausea and vomiting)    due to some sedations!  . Syncope and collapse 07/16/2007    SURGICAL HISTORY: Past Surgical History:  Procedure Laterality Date  . ABDOMINAL HYSTERECTOMY     supracervical- L ovary remains  . BREAST BIOPSY Left 02/22/2020   Q clip [place] bx, [facility]  . CARDIOVASCULAR STRESS TEST     08/28/06 normal   . COLONOSCOPY  08/13/2016   [facility]  . left hand surgery Left 2016   Dupuytrens  . LUMBAR DISC SURGERY  08/2017   [facility]  . OOPHORECTOMY Right    Right ovary removed   . PARTIAL MASTECTOMY WITH NEEDLE LOCALIZATION AND AXILLARY SENTINEL LYMPH NODE BX Left 03/15/2020   Procedure: PARTIAL MASTECTOMY WITH RF TAG PLACEMENT AND AXILLARY SENTINEL LYMPH NODE BX;  Surgeon: Herbert Pun, MD;  Location: [facility] ORS;  Service: General;  Laterality: Left;  . POLYPECTOMY    . TUBAL LIGATION     ? 1979    SOCIAL HISTORY: Social History   Socioeconomic History  . Marital status: Married    Spouse name: Not on file  . Number of children: Not on file  . Years of education: Not on file  . Highest education level: Not on file  Occupational History  . Occupation: Adult nurse: [employer]  Tobacco Use  . Smoking status: Former    Packs/day: 0.50    Years: 5.00    Total pack years: 2.50    Types: Cigarettes    Quit date: 07/29/1997    Years since quitting: 24.4  . Smokeless tobacco: Never  Vaping Use  . Vaping Use: Never used  Substance and Sexual Activity  . Alcohol use: Yes    Alcohol/week: 1.0 standard drink of alcohol    Types: 1 Glasses of wine per week  . Drug use: No  . Sexual activity: Yes  Other Topics Concern  . Not on file  Social History Narrative   Kids live close by, 3  grandkids- really enjoys, runs for exercise, ran a 5k March 2011; walks; quit 20 years; rare alcohol. Work for [employer]; lives in [place]. 2 boys- 60s.    Social Determinants of Health   Financial Resource Strain: Not on file  Food Insecurity: Not on file  Transportation Needs: Not on file  Physical Activity: Not on file  Stress: Not on file  Social Connections: Not on file  Intimate Partner Violence: Not on file    FAMILY HISTORY: Family History  Problem Relation Age of Onset  . Lung cancer Father 82  . Hyperlipidemia Mother   . Diabetes Mother   . Hypertension Mother   . Stroke Mother   . Heart disease Other   . Lung cancer Other        uncle  . Diabetes Other        aunt  . Coronary artery disease Maternal Grandfather   . Breast cancer Maternal Aunt        dx under 19  . Lung cancer Maternal Uncle   . Lung cancer Paternal Uncle   . Cancer Cousin  unk types  . Cancer Cousin        unk types  . Thyroid disease Neg Hx   . Colon cancer Neg Hx   . Colon polyps Neg Hx   . Esophageal cancer Neg Hx   . Rectal cancer Neg Hx   . Stomach cancer Neg Hx     ALLERGIES:  is allergic to codeine and bee venom.  MEDICATIONS:  Current Outpatient Medications  Medication Sig Dispense Refill  . amLODipine (NORVASC) 5 MG tablet Take 1 tablet (5 mg total) by mouth daily. 90 tablet 3  . anastrozole (ARIMIDEX) 1 MG tablet TAKE 1 TABLET DAILY 90 tablet 3  . aspirin 81 MG EC tablet Take 81 mg by mouth every Monday, Wednesday, and Friday.     Marland Kitchen atorvastatin (LIPITOR) 20 MG tablet Take 1 tablet (20 mg total) by mouth daily. 90 tablet 3  . Calcium Citrate-Vitamin D (CALCIUM + D PO) Take by mouth.    Marland Kitchen exemestane (AROMASIN) 25 MG tablet Take 1 tablet (25 mg total) by mouth daily after breakfast. 90 tablet 1  . gabapentin (NEURONTIN) 100 MG capsule Take 300 mg by mouth at bedtime.    Marland Kitchen Lifitegrast (XIIDRA) 5 % SOLN Place 1 drop into both eyes at bedtime.     Marland Kitchen lisinopril  (ZESTRIL) 10 MG tablet Take 1 tablet (10 mg total) by mouth daily. 90 tablet 3  . Multiple Vitamin (MULTIVITAMIN) tablet Take 1 tablet by mouth daily.    . Omega-3 Fatty Acids (FISH OIL) 1200 MG CAPS Take 1,200 mg by mouth 2 (two) times daily.    Marland Kitchen pyridoxine (B-6) 200 MG tablet Take 200 mg by mouth daily. (Patient not taking: Reported on 01/11/2022)     No current facility-administered medications for this visit.      Marland Kitchen  PHYSICAL EXAMINATION: ECOG PERFORMANCE STATUS: 0 - Asymptomatic  Vitals:   01/11/22 1000  BP: 114/73  Pulse: 78  Resp: 16  Temp: 98.9 F (37.2 C)   Filed Weights   01/11/22 1000  Weight: 160 lb 12.8 oz (72.9 kg)    Physical Exam HENT:     Head: Normocephalic and atraumatic.     Mouth/Throat:     Pharynx: No oropharyngeal exudate.  Eyes:     Pupils: Pupils are equal, round, and reactive to light.  Cardiovascular:     Rate and Rhythm: Normal rate and regular rhythm.  Pulmonary:     Effort: Pulmonary effort is normal. No respiratory distress.     Breath sounds: Normal breath sounds. No wheezing.  Abdominal:     General: Bowel sounds are normal. There is no distension.     Palpations: Abdomen is soft. There is no mass.     Tenderness: There is no abdominal tenderness. There is no guarding or rebound.  Musculoskeletal:        General: No tenderness. Normal range of motion.     Cervical back: Normal range of motion and neck supple.  Skin:    General: Skin is warm.  Neurological:     Mental Status: She is alert and oriented to person, place, and time.  Psychiatric:        Mood and Affect: Affect normal.   LABORATORY DATA:  I have reviewed the data as listed Lab Results  Component Value Date   WBC 4.2 04/23/2021   HGB 12.1 04/23/2021   HCT 36.5 04/23/2021   MCV 88.8 04/23/2021   PLT 219.0 04/23/2021   Recent Labs  04/23/21 1038  NA 140  K 4.3  CL 105  CO2 28  GLUCOSE 97  BUN 11  CREATININE 0.78  CALCIUM 9.7  PROT 7.1  ALBUMIN 4.4   AST 18  ALT 19  ALKPHOS 40  BILITOT 0.4    RADIOGRAPHIC STUDIES: I have personally reviewed the radiological images as listed and agreed with the findings in the report. No results found.  ASSESSMENT & PLAN:   Carcinoma of upper-inner quadrant of left breast in female, estrogen receptor positive ([place]) #Left breast cancer stage I ER/PR positive HER-2 negative; s/p lumpectomy; on adjuvant anastrozole [until 2026-fall ] mammogram Oct 2022-Dr. Cintron. STABLE  #On anastrozole tolerating-with moderate side effects  MSK [see below];  Discontinue anastrozole; switch over to exemestane given patient's MSK side effects [see below].  Again reviewed the potential side effect profile of exemestane.  [facility].  #Osteopenia-reviewed OCT 2022- T score+ -1.4-osteopenia [stable from 2020];  Patient physically active/exercising- on ca+vit D.  Given the stability of the bone density recommend continued monitoring.  We will repeat bone density at next visit  # Right hand pain/joint pains-likely secondary recent Dupuytren contracture surgery/healing Vs MSK from anastrozole.  Recommend switching over to exemestane-   mychart # DISPOSITION: # Follow-up in 3 months-MD; no labs;  Dr. Earley Abide, MD 01/11/2022 1:27 PM

## 2022-01-11 NOTE — Progress Notes (Signed)
Patient denies new problems/concerns today.   °

## 2022-01-11 NOTE — Assessment & Plan Note (Addendum)
#  Left breast cancer stage I ER/PR positive HER-2 negative; s/p lumpectomy; on adjuvant anastrozole [until 2026-fall ] mammogram Oct 2022-Dr. Cintron. STABLE  #On anastrozole tolerating-with moderate side effects  MSK [see below];  Discontinue anastrozole; switch over to exemestane given patient's MSK side effects [see below].  Again reviewed the potential side effect profile of exemestane.  Prescription sent to [facility].  #Osteopenia-reviewed OCT 2022- T score+ -1.4-osteopenia [stable from 2020];  Patient physically active/exercising- on ca+vit D.  Given the stability of the bone density recommend continued monitoring.  We will repeat bone density at next visit  # Right hand pain/joint pains-likely secondary recent Dupuytren contracture surgery/healing Vs MSK from anastrozole.  Recommend switching over to exemestane-   mychart # DISPOSITION: # Follow-up in 3 months-MD; no labs;  Dr. Jacinto Reap.

## 2022-01-17 DIAGNOSIS — Z4789 Encounter for other orthopedic aftercare: Secondary | ICD-10-CM | POA: Diagnosis not present

## 2022-01-17 DIAGNOSIS — M25641 Stiffness of right hand, not elsewhere classified: Secondary | ICD-10-CM | POA: Diagnosis not present

## 2022-01-24 DIAGNOSIS — M25641 Stiffness of right hand, not elsewhere classified: Secondary | ICD-10-CM | POA: Diagnosis not present

## 2022-01-31 DIAGNOSIS — M25641 Stiffness of right hand, not elsewhere classified: Secondary | ICD-10-CM | POA: Diagnosis not present

## 2022-02-08 DIAGNOSIS — M25641 Stiffness of right hand, not elsewhere classified: Secondary | ICD-10-CM | POA: Diagnosis not present

## 2022-02-14 DIAGNOSIS — M25641 Stiffness of right hand, not elsewhere classified: Secondary | ICD-10-CM | POA: Diagnosis not present

## 2022-02-21 DIAGNOSIS — Z4789 Encounter for other orthopedic aftercare: Secondary | ICD-10-CM | POA: Diagnosis not present

## 2022-02-21 DIAGNOSIS — M25641 Stiffness of right hand, not elsewhere classified: Secondary | ICD-10-CM | POA: Diagnosis not present

## 2022-03-27 ENCOUNTER — Other Ambulatory Visit: Payer: Self-pay | Admitting: General Surgery

## 2022-03-27 DIAGNOSIS — Z853 Personal history of malignant neoplasm of breast: Secondary | ICD-10-CM

## 2022-04-11 ENCOUNTER — Ambulatory Visit: Payer: Self-pay | Admitting: Dermatology

## 2022-04-18 ENCOUNTER — Telehealth (INDEPENDENT_AMBULATORY_CARE_PROVIDER_SITE_OTHER): Payer: BC Managed Care – PPO | Admitting: Family Medicine

## 2022-04-18 ENCOUNTER — Other Ambulatory Visit (INDEPENDENT_AMBULATORY_CARE_PROVIDER_SITE_OTHER): Payer: BC Managed Care – PPO

## 2022-04-18 DIAGNOSIS — I10 Essential (primary) hypertension: Secondary | ICD-10-CM

## 2022-04-18 DIAGNOSIS — E7800 Pure hypercholesterolemia, unspecified: Secondary | ICD-10-CM

## 2022-04-18 DIAGNOSIS — E559 Vitamin D deficiency, unspecified: Secondary | ICD-10-CM | POA: Diagnosis not present

## 2022-04-18 LAB — COMPREHENSIVE METABOLIC PANEL
ALT: 18 U/L (ref 0–35)
AST: 17 U/L (ref 0–37)
Albumin: 4.2 g/dL (ref 3.5–5.2)
Alkaline Phosphatase: 48 U/L (ref 39–117)
BUN: 17 mg/dL (ref 6–23)
CO2: 29 mEq/L (ref 19–32)
Calcium: 9.7 mg/dL (ref 8.4–10.5)
Chloride: 103 mEq/L (ref 96–112)
Creatinine, Ser: 0.83 mg/dL (ref 0.40–1.20)
GFR: 72.4 mL/min (ref >60.00)
Glucose, Bld: 94 mg/dL (ref 70–99)
Potassium: 4.5 mEq/L (ref 3.5–5.1)
Sodium: 139 mEq/L (ref 135–145)
Total Bilirubin: 0.5 mg/dL (ref 0.2–1.2)
Total Protein: 7 g/dL (ref 6.0–8.3)

## 2022-04-18 LAB — CBC WITH DIFFERENTIAL/PLATELET
Basophils Absolute: 0 10*3/uL (ref 0.0–0.1)
Basophils Relative: 0.8 % (ref 0.0–3.0)
Eosinophils Absolute: 0 10*3/uL (ref 0.0–0.7)
Eosinophils Relative: 1 % (ref 0.0–5.0)
HCT: 37.6 % (ref 36.0–46.0)
Hemoglobin: 12.6 g/dL (ref 12.0–15.0)
Lymphocytes Relative: 31.4 % (ref 12.0–46.0)
Lymphs Abs: 1.4 10*3/uL (ref 0.7–4.0)
MCHC: 33.6 g/dL (ref 30.0–36.0)
MCV: 87.8 fl (ref 78.0–100.0)
Monocytes Absolute: 0.6 10*3/uL (ref 0.1–1.0)
Monocytes Relative: 13.3 % — ABNORMAL HIGH (ref 3.0–12.0)
Neutro Abs: 2.4 10*3/uL (ref 1.4–7.7)
Neutrophils Relative %: 53.5 % (ref 43.0–77.0)
Platelets: 188 10*3/uL (ref 150.0–400.0)
RBC: 4.28 Mil/uL (ref 3.87–5.11)
RDW: 14.4 % (ref 11.5–15.5)
WBC: 4.4 10*3/uL (ref 4.0–10.5)

## 2022-04-18 LAB — LIPID PANEL
Cholesterol: 179 mg/dL (ref 0–200)
HDL: 52.4 mg/dL (ref >39.00)
LDL Cholesterol: 97 mg/dL (ref 0–99)
NonHDL: 126.51
Total CHOL/HDL Ratio: 3
Triglycerides: 148 mg/dL (ref 0.0–149.0)
VLDL: 29.6 mg/dL (ref 0.0–40.0)

## 2022-04-18 LAB — TSH: TSH: 2.72 u[IU]/mL (ref 0.35–5.50)

## 2022-04-18 NOTE — Telephone Encounter (Signed)
Lab order

## 2022-04-19 ENCOUNTER — Ambulatory Visit: Payer: BC Managed Care – PPO | Admitting: Internal Medicine

## 2022-04-19 ENCOUNTER — Inpatient Hospital Stay: Payer: BC Managed Care – PPO | Attending: Internal Medicine | Admitting: Nurse Practitioner

## 2022-04-19 ENCOUNTER — Encounter: Payer: Self-pay | Admitting: Nurse Practitioner

## 2022-04-19 VITALS — BP 129/67 | HR 71 | Temp 98.5°F | Resp 17 | Ht 64.0 in | Wt 164.5 lb

## 2022-04-19 DIAGNOSIS — C50212 Malignant neoplasm of upper-inner quadrant of left female breast: Secondary | ICD-10-CM

## 2022-04-19 DIAGNOSIS — M8580 Other specified disorders of bone density and structure, unspecified site: Secondary | ICD-10-CM | POA: Diagnosis not present

## 2022-04-19 DIAGNOSIS — Z5181 Encounter for therapeutic drug level monitoring: Secondary | ICD-10-CM | POA: Diagnosis not present

## 2022-04-19 DIAGNOSIS — Z801 Family history of malignant neoplasm of trachea, bronchus and lung: Secondary | ICD-10-CM | POA: Insufficient documentation

## 2022-04-19 DIAGNOSIS — M25561 Pain in right knee: Secondary | ICD-10-CM

## 2022-04-19 DIAGNOSIS — Z79811 Long term (current) use of aromatase inhibitors: Secondary | ICD-10-CM | POA: Insufficient documentation

## 2022-04-19 DIAGNOSIS — Z08 Encounter for follow-up examination after completed treatment for malignant neoplasm: Secondary | ICD-10-CM

## 2022-04-19 DIAGNOSIS — M25562 Pain in left knee: Secondary | ICD-10-CM

## 2022-04-19 DIAGNOSIS — Z87891 Personal history of nicotine dependence: Secondary | ICD-10-CM | POA: Insufficient documentation

## 2022-04-19 DIAGNOSIS — Z90710 Acquired absence of both cervix and uterus: Secondary | ICD-10-CM | POA: Diagnosis not present

## 2022-04-19 DIAGNOSIS — Z923 Personal history of irradiation: Secondary | ICD-10-CM | POA: Diagnosis not present

## 2022-04-19 DIAGNOSIS — Z853 Personal history of malignant neoplasm of breast: Secondary | ICD-10-CM

## 2022-04-19 DIAGNOSIS — Z79899 Other long term (current) drug therapy: Secondary | ICD-10-CM | POA: Diagnosis not present

## 2022-04-19 DIAGNOSIS — Z803 Family history of malignant neoplasm of breast: Secondary | ICD-10-CM | POA: Insufficient documentation

## 2022-04-19 DIAGNOSIS — Z90721 Acquired absence of ovaries, unilateral: Secondary | ICD-10-CM | POA: Insufficient documentation

## 2022-04-19 DIAGNOSIS — Z170 Estrogen receptor positive status [ER+]: Secondary | ICD-10-CM | POA: Diagnosis not present

## 2022-04-19 DIAGNOSIS — Z9079 Acquired absence of other genital organ(s): Secondary | ICD-10-CM | POA: Diagnosis not present

## 2022-04-19 LAB — VITAMIN D 25 HYDROXY (VIT D DEFICIENCY, FRACTURES): VITD: 34.7 ng/mL (ref 30.00–100.00)

## 2022-04-19 NOTE — Progress Notes (Signed)
Patient here for oncology follow-up appointment,  concerns of leg pain    

## 2022-04-19 NOTE — Progress Notes (Signed)
Brittney Johnson CONSULT NOTE  Patient Care Team: Tower, Wynelle Fanny, MD as PCP - General Noreene Filbert, MD as Referring Physician (Radiation Oncology) Rico Junker, RN as Oncology Nurse Navigator Herbert Pun, MD as Consulting Physician (General Surgery) Cammie Sickle, MD as Consulting Physician (Internal Medicine)  CHIEF COMPLAINTS/PURPOSE OF CONSULTATION: Breast cancer  #  Oncology History Overview Note  # LEFT BREAST CA- INVASIVE MAMMARY CARCINOMA, NO SPECIAL TYPE, WITH MICROPAPILLARY  FEATURES; ER/PR Positive [90%]; Her 2 NEG; G-1; 0.8 x 0.7 x 1 cm.  Oncotype recurrence score= 0; 3% distant recurrence; no chemotherapy; adjuvant radiation;  s/p RT [nov 5th, 2021];   # NOV end 221- START Anastrazole[previous HRT*]; JUNE 16th, 2023- Discontinue anastrozole; switch over to exemestane given patient's MSK side effects  # # SURVIVORSHIP:   # GENETICS: No pathogenic variants; VUS*  DIAGNOSIS: Cure  STAGE: 1        ;  GOALS: Cure  CURRENT/MOST RECENT THERAPY :     Carcinoma of upper-inner quadrant of left breast in female, estrogen receptor positive ([place])  03/01/2020 Initial Diagnosis   Carcinoma of upper-inner quadrant of left breast in female, estrogen receptor positive ([place])    Genetic Testing   Negative genetic testing. No pathogenic variants identified on the [facility] Multi-Cancer Panel. VUS in AIP called c.790_792del and VUS in RET called c.1163T>C identified. The report date is 04/07/2020.  The Multi-Cancer Panel offered by [facility] includes sequencing and/or deletion duplication testing of the following 85 genes: AIP, ALK, APC, ATM, AXIN2,BAP1,  BARD1, BLM, BMPR1A, BRCA1, BRCA2, BRIP1, CASR, CDC73, CDH1, CDK4, CDKN1B, CDKN1C, CDKN2A (p14ARF), CDKN2A (p16INK4a), CEBPA, CHEK2, CTNNA1, DICER1, DIS3L2, EGFR (c.2369C>T, p.Thr790Met variant only), EPCAM (Deletion/duplication testing only), FH, FLCN, GATA2, GPC3, GREM1 (Promoter region deletion/duplication  testing only), HOXB13 (c.251G>A, p.Gly84Glu), HRAS, KIT, MAX, MEN1, MET, MITF (c.952G>A, p.Glu318Lys variant only), MLH1, MSH2, MSH3, MSH6, MUTYH, NBN, NF1, NF2, NTHL1, PALB2, PDGFRA, PHOX2B, PMS2, POLD1, POLE, POT1, PRKAR1A, PTCH1, PTEN, RAD50, RAD51C, RAD51D, RB1, RECQL4, RET, RNF43, RUNX1, SDHAF2, SDHA (sequence changes only), SDHB, SDHC, SDHD, SMAD4, SMARCA4, SMARCB1, SMARCE1, STK11, SUFU, TERC, TERT, TMEM127, TP53, TSC1, TSC2, VHL, WRN and WT1.       HISTORY OF PRESENTING ILLNESS:  Brittney Johnson 66 y.o.  female diagnosis of stage I ER/PR positive HER-2 negative breast cancer on aromasin is here for follow up. She complains of aching pain in her lower legs. Worse with inactivity, getting up in the morning, sitting at desk. She experienced this previously with anastrozole and it did not improve with 1 week medication holiday. She has chronic dupuytren contractures of both hands. Feels that her aches are possibly better with aromasin but essentially the same. No nausea, vomiting. No breast concerns.   Review of Systems  Constitutional:  Negative for chills, diaphoresis, fever, malaise/fatigue and weight loss.  HENT:  Negative for nosebleeds and sore throat.   Eyes:  Negative for double vision.  Respiratory:  Negative for cough, hemoptysis, sputum production, shortness of breath and wheezing.   Cardiovascular:  Negative for chest pain, palpitations, orthopnea and leg swelling.  Gastrointestinal:  Negative for abdominal pain, blood in stool, constipation, diarrhea, heartburn, melena, nausea and vomiting.  Genitourinary:  Negative for dysuria, frequency and urgency.  Musculoskeletal:  Positive for joint pain and myalgias. Negative for back pain.  Skin: Negative.  Negative for itching and rash.  Neurological:  Negative for dizziness, tingling, focal weakness, weakness and headaches.  Endo/Heme/Allergies:  Does not bruise/bleed easily.  Psychiatric/Behavioral:  Negative for  depression. The  patient is not nervous/anxious and does not have insomnia.      MEDICAL HISTORY:  Past Medical History:  Diagnosis Date   Atypical pneumonia 1998   not hosp   Breast cancer ([place]) 2021   left-sx, radation completed   DDD (degenerative disc disease), cervical    Family history of breast cancer    Family history of lung cancer    Hyperlipidemia    Hypertension    Low back pain    on and off   PONV (postoperative nausea and vomiting)    due to some sedations!   Syncope and collapse 07/16/2007    SURGICAL HISTORY: Past Surgical History:  Procedure Laterality Date   ABDOMINAL HYSTERECTOMY     supracervical- L ovary remains   BREAST BIOPSY Left 02/22/2020   Q clip [place] bx, [facility]   CARDIOVASCULAR STRESS TEST     08/28/06 normal    COLONOSCOPY  08/13/2016   [facility]   left hand surgery Left 2016   Dupuytrens   LUMBAR DISC SURGERY  08/2017   [facility]   OOPHORECTOMY Right    Right ovary removed    PARTIAL MASTECTOMY WITH NEEDLE LOCALIZATION AND AXILLARY SENTINEL LYMPH NODE BX Left 03/15/2020   Procedure: PARTIAL MASTECTOMY WITH RF TAG PLACEMENT AND AXILLARY SENTINEL LYMPH NODE BX;  Surgeon: Herbert Pun, MD;  Location: [facility] ORS;  Service: General;  Laterality: Left;   POLYPECTOMY     TUBAL LIGATION     ? 1979    SOCIAL HISTORY: Social History   Socioeconomic History   Marital status: Married    Spouse name: Not on file   Number of children: Not on file   Years of education: Not on file   Highest education level: Not on file  Occupational History   Occupation: Adult nurse: [employer]  Tobacco Use   Smoking status: Former    Packs/day: 0.50    Years: 5.00    Total pack years: 2.50    Types: Cigarettes    Quit date: 07/29/1997    Years since quitting: 24.7   Smokeless tobacco: Never  Vaping Use   Vaping Use: Never used  Substance and Sexual Activity   Alcohol use: Yes    Alcohol/week: 1.0 standard drink of alcohol    Types: 1 Glasses  of wine per week   Drug use: No   Sexual activity: Yes  Other Topics Concern   Not on file  Social History Narrative   Kids live close by, 3 grandkids- really enjoys, runs for exercise, ran a 5k March 2011; walks; quit 20 years; rare alcohol. Work for [employer]; lives in [place]. 2 boys- 1s.    Social Determinants of Health   Financial Resource Strain: Not on file  Food Insecurity: Not on file  Transportation Needs: Not on file  Physical Activity: Not on file  Stress: Not on file  Social Connections: Not on file  Intimate Partner Violence: Not on file    FAMILY HISTORY: Family History  Problem Relation Age of Onset   Lung cancer Father 45   Hyperlipidemia Mother    Diabetes Mother    Hypertension Mother    Stroke Mother    Heart disease Other    Lung cancer Other        uncle   Diabetes Other        aunt   Coronary artery disease Maternal Grandfather    Breast cancer Maternal Aunt  dx under 47   Lung cancer Maternal Uncle    Lung cancer Paternal Uncle    Cancer Cousin        unk types   Cancer Cousin        unk types   Thyroid disease Neg Hx    Colon cancer Neg Hx    Colon polyps Neg Hx    Esophageal cancer Neg Hx    Rectal cancer Neg Hx    Stomach cancer Neg Hx     ALLERGIES:  is allergic to codeine and bee venom.  MEDICATIONS:  Current Outpatient Medications  Medication Sig Dispense Refill   amLODipine (NORVASC) 5 MG tablet Take 1 tablet (5 mg total) by mouth daily. 90 tablet 3   aspirin 81 MG EC tablet Take 81 mg by mouth every Monday, Wednesday, and Friday.      atorvastatin (LIPITOR) 20 MG tablet Take 1 tablet (20 mg total) by mouth daily. 90 tablet 3   Calcium Citrate-Vitamin D (CALCIUM + D PO) Take by mouth.     exemestane (AROMASIN) 25 MG tablet Take 1 tablet (25 mg total) by mouth daily after breakfast. 90 tablet 1   gabapentin (NEURONTIN) 100 MG capsule Take 300 mg by mouth at bedtime.     Lifitegrast (XIIDRA) 5 % SOLN Place 1 drop into  both eyes at bedtime.      lisinopril (ZESTRIL) 10 MG tablet Take 1 tablet (10 mg total) by mouth daily. 90 tablet 3   Multiple Vitamin (MULTIVITAMIN) tablet Take 1 tablet by mouth daily.     Omega-3 Fatty Acids (FISH OIL) 1200 MG CAPS Take 1,200 mg by mouth 2 (two) times daily.     anastrozole (ARIMIDEX) 1 MG tablet TAKE 1 TABLET DAILY (Patient not taking: Reported on 04/19/2022) 90 tablet 3   pyridoxine (B-6) 200 MG tablet Take 200 mg by mouth daily. (Patient not taking: Reported on 01/11/2022)     No current facility-administered medications for this visit.      Marland Kitchen  PHYSICAL EXAMINATION: ECOG PERFORMANCE STATUS: 0 - Asymptomatic  Vitals:   04/19/22 1315  BP: 129/67  Pulse: 71  Resp: 17  Temp: 98.5 F (36.9 C)  SpO2: 98%   Filed Weights   04/19/22 1315  Weight: 164 lb 8 oz (74.6 kg)    Physical Exam Vitals reviewed.  Constitutional:      Appearance: She is not ill-appearing.  HENT:     Head: Atraumatic.  Cardiovascular:     Rate and Rhythm: Normal rate and regular rhythm.  Pulmonary:     Effort: Pulmonary effort is normal.     Breath sounds: Normal breath sounds.  Abdominal:     General: There is no distension.     Palpations: Abdomen is soft.  Musculoskeletal:        General: Deformity (contractures of hands) present. No tenderness.  Skin:    General: Skin is warm.  Neurological:     Mental Status: She is alert and oriented to person, place, and time.  Psychiatric:        Mood and Affect: Mood and affect normal.        Behavior: Behavior normal.    LABORATORY DATA:  I have reviewed the data as listed Lab Results  Component Value Date   WBC 4.4 04/18/2022   HGB 12.6 04/18/2022   HCT 37.6 04/18/2022   MCV 87.8 04/18/2022   PLT 188.0 04/18/2022   Recent Labs    04/23/21 1038 04/18/22 0804  NA 140 139  K 4.3 4.5  CL 105 103  CO2 28 29  GLUCOSE 97 94  BUN 11 17  CREATININE 0.78 0.83  CALCIUM 9.7 9.7  PROT 7.1 7.0  ALBUMIN 4.4 4.2  AST 18 17   ALT 19 18  ALKPHOS 40 48  BILITOT 0.4 0.5     RADIOGRAPHIC STUDIES: I have personally reviewed the radiological images as listed and agreed with the findings in the report. No results found.  05/01/21- Diagnostic breast tomo bilateral - bi-rads category 2: benign.    ASSESSMENT & PLAN:   Carcinoma of upper-inner quadrant of left breast in female, estrogen receptor positive ([place]) # Left breast cancer stage I ER/PR positive HER-2 negative; s/p lumpectomy; Low risk oncotype. on adjuvant anastrozole [until 2026-fall ] mammogram Oct 2022-Dr. Cintron. STABLE. Previously on anastrozole. TOlerated moderately with MSK side effects. Now on exemestane with similar side effects. Unclear if related to AI vs other etiologies- MSK? Autoimmune (prev + ana and saw rheumatology), statin? We discussed options including treatment holiday, continuing to see if symptoms improve over next 6 months. She is undecided and will talk ot her pcp at her physical in a couple of weeks. Plan is to continue at least 5 years of adjuvant therapy.    # Osteopenia-reviewed OCT 2022- T score+ -1.4-osteopenia [stable from 2020];  Patient physically active/exercising- on ca+vit D.  Given the stability of the bone density recommend continued monitoring. Plan to repeat in 06/2022.    # Right hand pain/joint pains-likely secondary recent Dupuytren contracture surgery/healing Vs MSK from AI.   # arthralgias of lower legs- bilaterally. Etiology unclear. Possibly related to AI therapy. Other etiologies- MSK? Autoimmune? Statin induced? Others? Patient will discuss other etiologies with pcp before deciding to take chemo holiday. I advised unclear if benefit d/t symptoms not improving with previous break.  Can trial topical voltaren gel in interim.    Mychart  DISPOSITION: Bone density in 3 months then week later labs (cbc, cmp), Dr. Rogue Bussing [place]  No problem-specific Assessment & Plan notes found for this encounter.   Verlon Au, NP 04/19/2022

## 2022-04-22 DIAGNOSIS — Z4789 Encounter for other orthopedic aftercare: Secondary | ICD-10-CM | POA: Diagnosis not present

## 2022-04-25 ENCOUNTER — Encounter: Payer: Self-pay | Admitting: Family Medicine

## 2022-04-25 ENCOUNTER — Ambulatory Visit (INDEPENDENT_AMBULATORY_CARE_PROVIDER_SITE_OTHER): Payer: BC Managed Care – PPO | Admitting: Family Medicine

## 2022-04-25 VITALS — BP 118/64 | HR 73 | Temp 97.7°F | Ht 64.25 in | Wt 162.4 lb

## 2022-04-25 DIAGNOSIS — M85859 Other specified disorders of bone density and structure, unspecified thigh: Secondary | ICD-10-CM | POA: Diagnosis not present

## 2022-04-25 DIAGNOSIS — Z23 Encounter for immunization: Secondary | ICD-10-CM | POA: Diagnosis not present

## 2022-04-25 DIAGNOSIS — E7800 Pure hypercholesterolemia, unspecified: Secondary | ICD-10-CM | POA: Diagnosis not present

## 2022-04-25 DIAGNOSIS — M7989 Other specified soft tissue disorders: Secondary | ICD-10-CM | POA: Insufficient documentation

## 2022-04-25 DIAGNOSIS — I10 Essential (primary) hypertension: Secondary | ICD-10-CM

## 2022-04-25 DIAGNOSIS — Z0000 Encounter for general adult medical examination without abnormal findings: Secondary | ICD-10-CM | POA: Diagnosis not present

## 2022-04-25 DIAGNOSIS — M79606 Pain in leg, unspecified: Secondary | ICD-10-CM | POA: Insufficient documentation

## 2022-04-25 DIAGNOSIS — M79604 Pain in right leg: Secondary | ICD-10-CM

## 2022-04-25 DIAGNOSIS — E559 Vitamin D deficiency, unspecified: Secondary | ICD-10-CM

## 2022-04-25 DIAGNOSIS — M79605 Pain in left leg: Secondary | ICD-10-CM

## 2022-04-25 MED ORDER — LISINOPRIL 10 MG PO TABS: 10.0000 mg | ORAL_TABLET | Freq: Every day | ORAL | 3 refills | Status: DC

## 2022-04-25 MED ORDER — AMLODIPINE BESYLATE 5 MG PO TABS: 5.0000 mg | ORAL_TABLET | Freq: Every day | ORAL | 3 refills | Status: DC

## 2022-04-25 NOTE — Assessment & Plan Note (Signed)
bp in fair control at this time  BP Readings from Last 1 Encounters:  04/25/22 118/64   No changes needed Most recent labs reviewed  Disc lifstyle change with low sodium diet and exercise  Taking amlodipine 5 mg daily  Lisinopril 10 mg daily  Will continue

## 2022-04-25 NOTE — Patient Instructions (Addendum)
If you are interested in the shingles vaccine series (Shingrix), call your insurance or pharmacy to check on coverage and location it must be given.  If affordable - you can schedule it here or at your pharmacy depending on coverage   Hold either the aromasin or the atorvastatin for 1-2 weeks and update [place] about the leg pain and swelling   Labs look good  Eat a healthy balanced diet   Get your mammogram as planned   Use sun protection   Flu shot today

## 2022-04-25 NOTE — Assessment & Plan Note (Signed)
Poss due to aromasin vs venous insuff Is dependent  Not severe  Nl exam today   May try short trial off med to see if it improves  Can tolerate however  No s/s of dvt today

## 2022-04-25 NOTE — Assessment & Plan Note (Signed)
May be from aromasin or statin  Will hold one for 1-2 weeks and if not improved try holding the other  Then update

## 2022-04-25 NOTE — Assessment & Plan Note (Signed)
Reviewed health habits including diet and exercise and skin cancer prevention Reviewed appropriate screening tests for age  Also reviewed health mt list, fam hx and immunization status , as well as social and family history   See HPI Labs reviewed Flu shot given  Considering shingrix later Mammogram scheduled next mo in setting of personal breast cancer hx Colonoscopy utd 08/2020 with 3 y recall   dexa utd 06/2021  No falls or fx

## 2022-04-25 NOTE — Progress Notes (Signed)
Subjective:    Patient ID: Brittney Johnson, female    DOB: 10-09-1955, 66 y.o.   MRN: [id number]  HPI Here for health maintenance exam and to review chronic medical problems    Wt Readings from Last 3 Encounters:  04/25/22 162 lb 6 oz (73.7 kg)  04/19/22 164 lb 8 oz (74.6 kg)  01/11/22 160 lb 12.8 oz (72.9 kg)   27.66 kg/m   Still working full time  May retire next November  Works at home    Immunization History  Administered Date(s) Administered   Fluad Quad(high Dose 65+) 04/19/2019, 05/24/2020, 04/23/2021   Influenza Split 05/30/2011, 04/28/2012   Influenza Whole 04/19/2010   Influenza,inj,Quad PF,6+ Mos 04/20/2013, 05/11/2014, 04/20/2015, 04/09/2016, 04/11/2017, 04/13/2018   PFIZER(Purple Top)SARS-COV-2 Vaccination 10/04/2019, 10/27/2019, 07/10/2020   Pneumococcal Conjugate-13 04/19/2019   Pneumococcal Polysaccharide-23 07/29/2005, 04/19/2020   Tdap 12/05/2010, 01/06/2020   Zoster, Live 05/23/2015   Health Maintenance Due  Topic Date Due   Hepatitis C Screening  Never done   Zoster Vaccines- Shingrix (1 of 2) Never done   COVID-19 Vaccine (4 - Pfizer risk series) 09/04/2020   INFLUENZA VACCINE  02/26/2022   Flu shot -today   Shingrix: interested   Mammogram 04/2021- has appt for 10/6 Personal h/o breast cancer - follow up is going well  No lumps   Colonoscopy  08/2020 with 3 y recall   Dexa  06/2021  osteopenia  Has on scheduled this dec  Arimidex in the past -it made her hurt all over  Now aromasin  Falls -none Fractures-none  Supplements  ca and D and mvi  D level 34.7  Exercise : was walking every day until legs started hurting   HTN bp is stable today  No cp or palpitations or headaches or edema  No side effects to medicines  BP Readings from Last 3 Encounters:  04/25/22 118/64  04/19/22 129/67  01/11/22 114/73     Amlodipine 5 mg daily  Lisinopril 10 mg daily   GERD- no longer on medicine   Hyperlipidemia Lab Results  Component  Value Date   CHOL 179 04/18/2022   CHOL 197 04/23/2021   CHOL 199 04/14/2020   Lab Results  Component Value Date   HDL 52.40 04/18/2022   HDL 70.60 04/23/2021   HDL 66.40 04/14/2020   Lab Results  Component Value Date   LDLCALC 97 04/18/2022   LDLCALC 101 (H) 04/23/2021   LDLCALC 111 (H) 04/14/2020   Lab Results  Component Value Date   TRIG 148.0 04/18/2022   TRIG 129.0 04/23/2021   TRIG 109.0 04/14/2020   Lab Results  Component Value Date   CHOLHDL 3 04/18/2022   CHOLHDL 3 04/23/2021   CHOLHDL 3 04/14/2020   Lab Results  Component Value Date   LDLDIRECT 160.9 03/02/2013   LDLDIRECT 152.1 02/03/2012   LDLDIRECT 128.7 05/31/2011   Atorvastatin 20 mg daily  Fish oil   Legs hurt  Starting to swell also   Other labs Lab Results  Component Value Date   CREATININE 0.83 04/18/2022   BUN 17 04/18/2022   NA 139 04/18/2022   K 4.5 04/18/2022   CL 103 04/18/2022   CO2 29 04/18/2022   Lab Results  Component Value Date   ALT 18 04/18/2022   AST 17 04/18/2022   ALKPHOS 48 04/18/2022   BILITOT 0.5 04/18/2022   Lab Results  Component Value Date   WBC 4.4 04/18/2022   HGB 12.6 04/18/2022   HCT 37.6  04/18/2022   MCV 87.8 04/18/2022   PLT 188.0 04/18/2022   Lab Results  Component Value Date   TSH 2.72 04/18/2022   Patient Active Problem List   Diagnosis Date Noted   Leg pain 04/25/2022   Leg swelling 04/25/2022   Genetic testing 04/10/2020   Family history of breast cancer    Family history of lung cancer    Carcinoma of upper-inner quadrant of left breast in female, estrogen receptor positive ([place]) 03/01/2020   Osteopenia 05/15/2019   Estrogen deficiency 04/19/2019   Nocturnal leg cramps 04/13/2018   Colon cancer screening 04/09/2016   Vitamin D deficiency 04/09/2016   Enlarged thyroid 12/05/2015   Contracture of palmar fascia 07/10/2015   Encounter for routine gynecological examination 02/20/2015   Near syncope 08/05/2014   Abnormal EKG 08/05/2014    Anxiety 08/05/2014   H/O cold sores 06/06/2014   Dupuytren's contracture of right hand 02/08/2014   Stress reaction 03/09/2013   Routine general medical examination at a health care facility 12/05/2010   Other screening mammogram 12/05/2010   Raynaud disease 12/05/2010   Hyperlipidemia 06/29/2010   HYPERTENSION, BENIGN ESSENTIAL 06/29/2010   Lumbar back pain with radiculopathy affecting right lower extremity 06/29/2010   Past Medical History:  Diagnosis Date   Atypical pneumonia 1998   not hosp   Breast cancer ([place]) 2021   left-sx, radation completed   DDD (degenerative disc disease), cervical    Family history of breast cancer    Family history of lung cancer    Hyperlipidemia    Hypertension    Low back pain    on and off   PONV (postoperative nausea and vomiting)    due to some sedations!   Syncope and collapse 07/16/2007   Past Surgical History:  Procedure Laterality Date   ABDOMINAL HYSTERECTOMY     supracervical- L ovary remains   BREAST BIOPSY Left 02/22/2020   Q clip [place] bx, [facility]   CARDIOVASCULAR STRESS TEST     08/28/06 normal    COLONOSCOPY  08/13/2016   [facility]   left hand surgery Left 2016   Dupuytrens   LUMBAR DISC SURGERY  08/2017   [facility]   OOPHORECTOMY Right    Right ovary removed    PARTIAL MASTECTOMY WITH NEEDLE LOCALIZATION AND AXILLARY SENTINEL LYMPH NODE BX Left 03/15/2020   Procedure: PARTIAL MASTECTOMY WITH RF TAG PLACEMENT AND AXILLARY SENTINEL LYMPH NODE BX;  Surgeon: Herbert Pun, MD;  Location: [facility] ORS;  Service: General;  Laterality: Left;   POLYPECTOMY     TUBAL LIGATION     ? 1979   Social History   Tobacco Use   Smoking status: Former    Packs/day: 0.50    Years: 5.00    Total pack years: 2.50    Types: Cigarettes    Quit date: 07/29/1997    Years since quitting: 24.7   Smokeless tobacco: Never  Vaping Use   Vaping Use: Never used  Substance Use Topics   Alcohol use: Yes    Alcohol/week: 1.0 standard drink of alcohol     Types: 1 Glasses of wine per week   Drug use: No   Family History  Problem Relation Age of Onset   Lung cancer Father 26   Hyperlipidemia Mother    Diabetes Mother    Hypertension Mother    Stroke Mother    Heart disease Other    Lung cancer Other        uncle   Diabetes Other  aunt   Coronary artery disease Maternal Grandfather    Breast cancer Maternal Aunt        dx under 33   Lung cancer Maternal Uncle    Lung cancer Paternal Uncle    Cancer Cousin        unk types   Cancer Cousin        unk types   Thyroid disease Neg Hx    Colon cancer Neg Hx    Colon polyps Neg Hx    Esophageal cancer Neg Hx    Rectal cancer Neg Hx    Stomach cancer Neg Hx    Allergies  Allergen Reactions   Codeine     REACTION: passed out   Bee Venom Swelling   Current Outpatient Medications on File Prior to Visit  Medication Sig Dispense Refill   aspirin 81 MG EC tablet Take 81 mg by mouth every Monday, Wednesday, and Friday.      atorvastatin (LIPITOR) 20 MG tablet Take 1 tablet (20 mg total) by mouth daily. 90 tablet 3   Calcium Citrate-Vitamin D (CALCIUM + D PO) Take by mouth.     exemestane (AROMASIN) 25 MG tablet Take 1 tablet (25 mg total) by mouth daily after breakfast. 90 tablet 1   gabapentin (NEURONTIN) 100 MG capsule Take 300 mg by mouth at bedtime.     Lifitegrast (XIIDRA) 5 % SOLN Place 1 drop into both eyes at bedtime.      Multiple Vitamin (MULTIVITAMIN) tablet Take 1 tablet by mouth daily.     Omega-3 Fatty Acids (FISH OIL) 1200 MG CAPS Take 1,200 mg by mouth 2 (two) times daily.     No current facility-administered medications on file prior to visit.    Review of Systems  Constitutional:  Negative for activity change, appetite change, fatigue, fever and unexpected weight change.  HENT:  Negative for congestion, ear pain, rhinorrhea, sinus pressure and sore throat.   Eyes:  Negative for pain, redness and visual disturbance.  Respiratory:  Negative for cough,  shortness of breath and wheezing.   Cardiovascular:  Positive for leg swelling. Negative for chest pain and palpitations.  Gastrointestinal:  Negative for abdominal pain, blood in stool, constipation and diarrhea.  Endocrine: Negative for polydipsia and polyuria.  Genitourinary:  Negative for dysuria, frequency and urgency.  Musculoskeletal:  Negative for arthralgias, back pain and myalgias.       Leg pain bilateral Occ mild leg swelling (end of the day) goes down at night   Skin:  Negative for pallor and rash.  Allergic/Immunologic: Negative for environmental allergies.  Neurological:  Negative for dizziness, syncope and headaches.  Hematological:  Negative for adenopathy. Does not bruise/bleed easily.  Psychiatric/Behavioral:  Negative for decreased concentration and dysphoric mood. The patient is not nervous/anxious.        Objective:   Physical Exam Constitutional:      General: She is not in acute distress.    Appearance: Normal appearance. She is well-developed and normal weight. She is not ill-appearing or diaphoretic.  HENT:     Head: Normocephalic and atraumatic.     Right Ear: Tympanic membrane, ear canal and external ear normal.     Left Ear: Tympanic membrane, ear canal and external ear normal.     Nose: Nose normal. No congestion.     Mouth/Throat:     Mouth: Mucous membranes are moist.     Pharynx: Oropharynx is clear. No posterior oropharyngeal erythema.  Eyes:  General: No scleral icterus.    Extraocular Movements: Extraocular movements intact.     Conjunctiva/sclera: Conjunctivae normal.     Pupils: Pupils are equal, round, and reactive to light.  Neck:     Thyroid: No thyromegaly.     Vascular: No carotid bruit or JVD.  Cardiovascular:     Rate and Rhythm: Normal rate and regular rhythm.     Pulses: Normal pulses.     Heart sounds: Normal heart sounds.     No gallop.  Pulmonary:     Effort: Pulmonary effort is normal. No respiratory distress.      Breath sounds: Normal breath sounds. No wheezing.     Comments: Good air exch Chest:     Chest wall: No tenderness.  Abdominal:     General: Bowel sounds are normal. There is no distension or abdominal bruit.     Palpations: Abdomen is soft. There is no mass.     Tenderness: There is no abdominal tenderness.     Hernia: No hernia is present.  Genitourinary:    Comments: Breast exam done by cancer care provider Musculoskeletal:        General: No tenderness. Normal range of motion.     Cervical back: Normal range of motion and neck supple. No rigidity. No muscular tenderness.     Right lower leg: No edema.     Left lower leg: No edema.     Comments: No kyphosis   Lymphadenopathy:     Cervical: No cervical adenopathy.  Skin:    General: Skin is warm and dry.     Coloration: Skin is not pale.     Findings: No erythema or rash.     Comments: Solar lentigines diffusely   Neurological:     Mental Status: She is alert. Mental status is at baseline.     Cranial Nerves: No cranial nerve deficit.     Motor: No abnormal muscle tone.     Coordination: Coordination normal.     Gait: Gait normal.     Deep Tendon Reflexes: Reflexes are normal and symmetric. Reflexes normal.  Psychiatric:        Mood and Affect: Mood normal.        Cognition and Memory: Cognition and memory normal.           Assessment & Plan:   Problem List Items Addressed This Visit       Cardiovascular and Mediastinum   HYPERTENSION, BENIGN ESSENTIAL    bp in fair control at this time  BP Readings from Last 1 Encounters:  04/25/22 118/64  No changes needed Most recent labs reviewed  Disc lifstyle change with low sodium diet and exercise  Taking amlodipine 5 mg daily  Lisinopril 10 mg daily  Will continue       Relevant Medications   lisinopril (ZESTRIL) 10 MG tablet   amLODipine (NORVASC) 5 MG tablet     Musculoskeletal and Integument   Osteopenia    dexa 08/2020 No falls or fx Prev on arimidex,  this was changed to aromasin On ca and D D level tx at 34.7 Good exercise /walking   Disc need for calcium/ vitamin D/ wt bearing exercise and bone density test every 2 y to monitor Disc safety/ fracture risk in detail          Other   Hyperlipidemia   Relevant Medications   lisinopril (ZESTRIL) 10 MG tablet   amLODipine (NORVASC) 5 MG tablet   Leg pain  May be from aromasin or statin  Will hold one for 1-2 weeks and if not improved try holding the other  Then update       Leg swelling    Poss due to aromasin vs venous insuff Is dependent  Not severe  Nl exam today   May try short trial off med to see if it improves  Can tolerate however  No s/s of dvt today      Routine general medical examination at a health care facility - Primary    Reviewed health habits including diet and exercise and skin cancer prevention Reviewed appropriate screening tests for age  Also reviewed health mt list, fam hx and immunization status , as well as social and family history   See HPI Labs reviewed Flu shot given  Considering shingrix later Mammogram scheduled next mo in setting of personal breast cancer hx Colonoscopy utd 08/2020 with 3 y recall   dexa utd 06/2021  No falls or fx       Relevant Orders   Flu Vaccine QUAD High Dose(Fluad) (Completed)   Vitamin D deficiency   Other Visit Diagnoses     Need for influenza vaccination       Relevant Orders   Flu Vaccine QUAD High Dose(Fluad) (Completed)

## 2022-04-25 NOTE — Assessment & Plan Note (Signed)
dexa 08/2020 No falls or fx Prev on arimidex, this was changed to aromasin On ca and D D level tx at 34.7 Good exercise /walking   Disc need for calcium/ vitamin D/ wt bearing exercise and bone density test every 2 y to monitor Disc safety/ fracture risk in detail

## 2022-05-02 ENCOUNTER — Encounter: Payer: Self-pay | Admitting: Nurse Practitioner

## 2022-05-03 ENCOUNTER — Other Ambulatory Visit: Payer: BC Managed Care – PPO

## 2022-05-03 ENCOUNTER — Inpatient Hospital Stay: Admission: RE | Admit: 2022-05-03 | Payer: BC Managed Care – PPO | Source: Ambulatory Visit

## 2022-05-03 ENCOUNTER — Ambulatory Visit
Admission: RE | Admit: 2022-05-03 | Discharge: 2022-05-03 | Disposition: A | Discharge status: Home / Self Care | Payer: BC Managed Care – PPO | Source: Ambulatory Visit | Attending: General Surgery | Admitting: General Surgery

## 2022-05-03 DIAGNOSIS — R922 Inconclusive mammogram: Secondary | ICD-10-CM | POA: Diagnosis not present

## 2022-05-03 DIAGNOSIS — Z853 Personal history of malignant neoplasm of breast: Secondary | ICD-10-CM | POA: Insufficient documentation

## 2022-05-06 ENCOUNTER — Encounter: Payer: Self-pay | Admitting: Family Medicine

## 2022-05-14 DIAGNOSIS — C50212 Malignant neoplasm of upper-inner quadrant of left female breast: Secondary | ICD-10-CM | POA: Diagnosis not present

## 2022-05-17 ENCOUNTER — Other Ambulatory Visit: Payer: Self-pay | Admitting: Family Medicine

## 2022-05-17 NOTE — Progress Notes (Signed)
Intol of atorvastatin

## 2022-05-27 ENCOUNTER — Encounter: Payer: Self-pay | Admitting: Dermatology

## 2022-05-27 ENCOUNTER — Ambulatory Visit (INDEPENDENT_AMBULATORY_CARE_PROVIDER_SITE_OTHER): Payer: BC Managed Care – PPO | Admitting: Dermatology

## 2022-05-27 DIAGNOSIS — Z853 Personal history of malignant neoplasm of breast: Secondary | ICD-10-CM

## 2022-05-27 DIAGNOSIS — L814 Other melanin hyperpigmentation: Secondary | ICD-10-CM

## 2022-05-27 DIAGNOSIS — D229 Melanocytic nevi, unspecified: Secondary | ICD-10-CM

## 2022-05-27 DIAGNOSIS — L820 Inflamed seborrheic keratosis: Secondary | ICD-10-CM | POA: Diagnosis not present

## 2022-05-27 DIAGNOSIS — Z5111 Encounter for antineoplastic chemotherapy: Secondary | ICD-10-CM

## 2022-05-27 DIAGNOSIS — L821 Other seborrheic keratosis: Secondary | ICD-10-CM

## 2022-05-27 DIAGNOSIS — L570 Actinic keratosis: Secondary | ICD-10-CM | POA: Diagnosis not present

## 2022-05-27 DIAGNOSIS — Z79899 Other long term (current) drug therapy: Secondary | ICD-10-CM

## 2022-05-27 DIAGNOSIS — L988 Other specified disorders of the skin and subcutaneous tissue: Secondary | ICD-10-CM

## 2022-05-27 DIAGNOSIS — Z1283 Encounter for screening for malignant neoplasm of skin: Secondary | ICD-10-CM

## 2022-05-27 DIAGNOSIS — L578 Other skin changes due to chronic exposure to nonionizing radiation: Secondary | ICD-10-CM

## 2022-05-27 NOTE — Progress Notes (Unsigned)
Follow-Up Visit   Subjective  Brittney Johnson is a 66 y.o. female who presents for the following: Annual Exam (Hx of AK's. No personal Hx of skin cancer). The patient presents for Total-Body Skin Exam (TBSE) for skin cancer screening and mole check.  The patient has spots, moles and lesions to be evaluated, some may be new or changing and the patient has concerns that these could be cancer.  The following portions of the chart were reviewed this encounter and updated as appropriate:  Tobacco  Allergies  Meds  Problems  Med Hx  Surg Hx  Fam Hx     Review of Systems: No other skin or systemic complaints except as noted in HPI or Assessment and Plan.  Objective  Well appearing patient in no apparent distress; mood and affect are within normal limits.  A full examination was performed including scalp, head, eyes, ears, nose, lips, neck, chest, axillae, abdomen, back, buttocks, bilateral upper extremities, bilateral lower extremities, hands, feet, fingers, toes, fingernails, and toenails. All findings within normal limits unless otherwise noted below.  right forehead above right lateral brow x1, left upper cutaneous lip x1 (2) Erythematous thin papules/macules with gritty scale.   Left Breast Linear scar. No lymphadenopathy   legs x2, left cheek Erythematous keratotic or waxy stuck-on papule or plaque.  face Rhytides and volume loss. Hyperpigmented macules   Assessment & Plan   Lentigines - Scattered tan macules - Due to sun exposure - Benign-appearing, observe - Recommend daily broad spectrum sunscreen SPF 30+ to sun-exposed areas, reapply every 2 hours as needed. - Call for any changes  Seborrheic Keratoses - Stuck-on, waxy, tan-brown papules and/or plaques  - Benign-appearing - Discussed benign etiology and prognosis. - Observe - Call for any changes  Melanocytic Nevi - Tan-brown and/or pink-flesh-colored symmetric macules and papules - Benign appearing on  exam today - Observation - Call clinic for new or changing moles - Recommend daily use of broad spectrum spf 30+ sunscreen to sun-exposed areas.   Hemangiomas - Red papules - Discussed benign nature - Observe - Call for any changes  Skin cancer screening performed today.  Actinic Damage with PreCancerous Actinic Keratoses Counseling for Topical Chemotherapy Management: Patient exhibits: - Severe, confluent actinic changes with pre-cancerous actinic keratoses that is secondary to cumulative UV radiation exposure over time - Condition that is severe; chronic, not at goal. - diffuse scaly erythematous macules and papules with underlying dyspigmentation - Discussed Prescription "Field Treatment" topical Chemotherapy for Severe, Chronic Confluent Actinic Changes with Pre-Cancerous Actinic Keratoses Field treatment involves treatment of an entire area of skin that has confluent Actinic Changes (Sun/ Ultraviolet light damage) and PreCancerous Actinic Keratoses by method of PhotoDynamic Therapy (PDT) and/or prescription Topical Chemotherapy agents such as 5-fluorouracil, 5-fluorouracil/calcipotriene, and/or imiquimod.  The purpose is to decrease the number of clinically evident and subclinical PreCancerous lesions to prevent progression to development of skin cancer by chemically destroying early precancer changes that may or may not be visible.  It has been shown to reduce the risk of developing skin cancer in the treated area. As a result of treatment, redness, scaling, crusting, and open sores may occur during treatment course. One or more than one of these methods may be used and may have to be used several times to control, suppress and eliminate the PreCancerous changes. Discussed treatment course, expected reaction, and possible side effects. - Recommend daily broad spectrum sunscreen SPF 30+ to sun-exposed areas, reapply every 2 hours as needed.  -  Staying in the shade or wearing long sleeves,  sun glasses (UVA+UVB protection) and wide brim hats (4-inch brim around the entire circumference of the hat) are also recommended. - Call for new or changing lesions.  - Start 5-fluorouracil/calcipotriene cream twice a day for 7 days to affected areas including left upper lip, left cheek and right brow. Prescription sent to [facility]. Patient advised they will receive an email to purchase the medication online and have it sent to their home. Patient provided with handout reviewing treatment course and side effects and advised to call or message [place] on MyChart with any concerns. Start in January 2024.  Reviewed course of treatment and expected reaction.  Patient advised to expect inflammation and crusting and advised that erosions are possible.  Patient advised to be diligent with sun protection during and after treatment. Counseled to keep medication out of reach of children and pets.   AK (actinic keratosis) (2) right forehead above right lateral brow x1, left upper cutaneous lip x1 Actinic keratoses are precancerous spots that appear secondary to cumulative UV radiation exposure/sun exposure over time. They are chronic with expected duration over 1 year. A portion of actinic keratoses will progress to squamous cell carcinoma of the skin. It is not possible to reliably predict which spots will progress to skin cancer and so treatment is recommended to prevent development of skin cancer.  Recommend daily broad spectrum sunscreen SPF 30+ to sun-exposed areas, reapply every 2 hours as needed.  Recommend staying in the shade or wearing long sleeves, sun glasses (UVA+UVB protection) and wide brim hats (4-inch brim around the entire circumference of the hat). Call for new or changing lesions.  Destruction of lesion - right forehead above right lateral brow x1, left upper cutaneous lip x1 Complexity: simple   Destruction method: cryotherapy   Informed consent: discussed and  consent obtained   Timeout:  patient name, date of birth, surgical site, and procedure verified Lesion destroyed using liquid nitrogen: Yes   Region frozen until ice ball extended beyond lesion: Yes   Outcome: patient tolerated procedure well with no complications   Post-procedure details: wound care instructions given   Additional details:  Prior to procedure, discussed risks of blister formation, small wound, skin dyspigmentation, or rare scar following cryotherapy. Recommend Vaseline ointment to treated areas while healing.   History of breast cancer Left Breast No lymphadenopathy. There are to visual exam Observe  Inflamed seborrheic keratosis legs x2, left cheek Symptomatic, irritating, patient would like treated. Destruction of lesion - legs x2, left cheek Complexity: simple   Destruction method: cryotherapy   Informed consent: discussed and consent obtained   Timeout:  patient name, date of birth, surgical site, and procedure verified Lesion destroyed using liquid nitrogen: Yes   Region frozen until ice ball extended beyond lesion: Yes   Outcome: patient tolerated procedure well with no complications   Post-procedure details: wound care instructions given   Additional details:  Prior to procedure, discussed risks of blister formation, small wound, skin dyspigmentation, or rare scar following cryotherapy. Recommend Vaseline ointment to treated areas while healing.   Elastosis of skin face Lentigenes Discussed the treatment option of BBL/laser.  Typically we recommend 1-3 treatment sessions about 5-8 weeks apart for best results.  The patient's condition may require "maintenance treatments" in the future.  The fee for BBL / laser treatments is $350 per treatment session for the whole face.  A fee can be quoted for other parts of the body.  Insurance typically does not pay for BBL/laser treatments and therefore the fee is an out-of-pocket cost.  Return in about 1 year (around  05/28/2023) for TBSE, AK Follow Up 2-3 months.  I, Emelia Salisbury, CMA, am acting as scribe for Sarina Ser, MD. Documentation: I have reviewed the above documentation for accuracy and completeness, and I agree with the above.  Sarina Ser, MD

## 2022-05-27 NOTE — Patient Instructions (Addendum)
Cryotherapy Aftercare  Wash gently with soap and water everyday.   Apply Vaseline daily until healed.    - Start 5-fluorouracil/calcipotriene cream twice a day for 7 days to affected areas including left upper lip, left cheek and right brow. Prescription sent to [facility]. Patient advised they will receive an email to purchase the medication online and have it sent to their home. Patient provided with handout reviewing treatment course and side effects and advised to call or message [place] on MyChart with any concerns. Start in January 2024.   Instructions for [facility]ations  One or more of your medications was sent to the [facility] mail order compounding pharmacy. You will receive an email from them and can purchase the medicine through that link. It will then be mailed to your home at the address you confirmed. If for any reason you do not receive an email from them, please check your spam folder. If you still do not find the email, please let us know. [facility] phone number is [phone].   5-Fluorouracil/Calcipotriene Patient Education   Actinic keratoses are the dry, red scaly spots on the skin caused by sun damage. A portion of these spots can turn into skin cancer with time, and treating them can help prevent development of skin cancer.   Treatment of these spots requires removal of the defective skin cells. There are various ways to remove actinic keratoses, including freezing with liquid nitrogen, treatment with creams, or treatment with a blue light procedure in the office.   5-fluorouracil cream is a topical cream used to treat actinic keratoses. It works by interfering with the growth of abnormal fast-growing skin cells, such as actinic keratoses. These cells peel off and are replaced by healthy ones.   5-fluorouracil/calcipotriene is a combination of the 5-fluorouracil cream with a vitamin D analog cream called calcipotriene. The  calcipotriene alone does not treat actinic keratoses. However, when it is combined with 5-fluorouracil, it helps the 5-fluorouracil treat the actinic keratoses much faster so that the same results can be achieved with a much shorter treatment time.  INSTRUCTIONS FOR 5-FLUOROURACIL/CALCIPOTRIENE CREAM:   5-fluorouracil/calcipotriene cream typically only needs to be used for 4-7 days. A thin layer should be applied twice a day to the treatment areas recommended by your physician.   If your physician prescribed you separate tubes of 5-fluourouracil and calcipotriene, apply a thin layer of 5-fluorouracil followed by a thin layer of calcipotriene.   Avoid contact with your eyes, nostrils, and mouth. Do not use 5-fluorouracil/calcipotriene cream on infected or open wounds.   You will develop redness, irritation and some crusting at areas where you have pre-cancer damage/actinic keratoses. IF YOU DEVELOP PAIN, BLEEDING, OR SIGNIFICANT CRUSTING, STOP THE TREATMENT EARLY - you have already gotten a good response and the actinic keratoses should clear up well.  Wash your hands after applying 5-fluorouracil 5% cream on your skin.   A moisturizer or sunscreen with a minimum SPF 30 should be applied each morning.   Once you have finished the treatment, you can apply a thin layer of Vaseline twice a day to irritated areas to soothe and calm the areas more quickly. If you experience significant discomfort, contact your physician.  For some patients it is necessary to repeat the treatment for best results.  SIDE EFFECTS: When using 5-fluorouracil/calcipotriene cream, you may have mild irritation, such as redness, dryness, swelling, or a mild burning sensation. This usually resolves within 2 weeks. The more actinic keratoses  you have, the more redness and inflammation you can expect during treatment. Eye irritation has been reported rarely. If this occurs, please let us know.  If you have any trouble using this  cream, please call the office. If you have any other questions about this information, please do not hesitate to ask me before you leave the office.   Melanoma ABCDEs  Melanoma is the most dangerous type of skin cancer, and is the leading cause of death from skin disease.  You are more likely to develop melanoma if you: Have light-colored skin, light-colored eyes, or red or blond hair Spend a lot of time in the sun Tan regularly, either outdoors or in a tanning bed Have had blistering sunburns, especially during childhood Have a close family member who has had a melanoma Have atypical moles or large birthmarks  Early detection of melanoma is key since treatment is typically straightforward and cure rates are extremely high if we catch it early.   The first sign of melanoma is often a change in a mole or a new dark spot.  The ABCDE system is a way of remembering the signs of melanoma.  A for asymmetry:  The two halves do not match. B for border:  The edges of the growth are irregular. C for color:  A mixture of colors are present instead of an even brown color. D for diameter:  Melanomas are usually (but not always) greater than 61m - the size of a pencil eraser. E for evolution:  The spot keeps changing in size, shape, and color.  Please check your skin once per month between visits. You can use a small mirror in front and a large mirror behind you to keep an eye on the back side or your body.   If you see any new or changing lesions before your next follow-up, please call to schedule a visit.  Please continue daily skin protection including broad spectrum sunscreen SPF 30+ to sun-exposed areas, reapplying every 2 hours as needed when you're outdoors.   Staying in the shade or wearing long sleeves, sun glasses (UVA+UVB protection) and wide brim hats (4-inch brim around the entire circumference of the hat) are also recommended for sun protection.     Discussed the treatment option of  BBL/laser.  Typically we recommend 1-3 treatment sessions about 5-8 weeks apart for best results.  The patient's condition may require "maintenance treatments" in the future.  The fee for BBL / laser treatments is $350 per treatment session for the whole face.  A fee can be quoted for other parts of the body. Insurance typically does not pay for BBL/laser treatments and therefore the fee is an out-of-pocket cost.    Due to recent changes in healthcare laws, you may see results of your pathology and/or laboratory studies on MyChart before the doctors have had a chance to review them. We understand that in some cases there may be results that are confusing or concerning to you. Please understand that not all results are received at the same time and often the doctors may need to interpret multiple results in order to provide you with the best plan of care or course of treatment. Therefore, we ask that you please give u[place]2 business days to thoroughly review all your results before contacting the office for clarification. Should we see a critical lab result, you will be contacted sooner.   If You Need Anything After Your Visit  If you have any questions or  concerns for your doctor, please call our main line at [phone] and press option 4 to reach your doctor's medical assistant. If no one answers, please leave a voicemail as directed and we will return your call as soon as possible. Messages left after 4 pm will be answered the following business day.   You may also send [place] a message via [place]. We typically respond to MyChart messages within 1-2 business days.  For prescription refills, please ask your pharmacy to contact our office. Our fax number is [phone].  If you have an urgent issue when the clinic is closed that cannot wait until the next business day, you can page your doctor at the number below.    Please note that while we do our best to be available for urgent issues outside of  office hours, we are not available 24/7.   If you have an urgent issue and are unable to reach [place], you may choose to seek medical care at your doctor's office, retail clinic, urgent care center, or emergency room.  If you have a medical emergency, please immediately call 911 or go to the emergency department.  Pager Numbers  - Dr. Nehemiah Massed: [phone]  - Dr. Laurence Ferrari: [phone]  - Dr. Nicole Kindred: [phone]  In the event of inclement weather, please call our main line at [phone] for an update on the status of any delays or closures.  Dermatology Medication Tips: Please keep the boxes that topical medications come in in order to help keep track of the instructions about where and how to use these. Pharmacies typically print the medication instructions only on the boxes and not directly on the medication tubes.   If your medication is too expensive, please contact our office at [phone] option 4 or send [place] a message through [place].   We are unable to tell what your co-pay for medications will be in advance as this is different depending on your insurance coverage. However, we may be able to find a substitute medication at lower cost or fill out paperwork to get insurance to cover a needed medication.   If a prior authorization is required to get your medication covered by your insurance company, please allow [place] 1-2 business days to complete this process.  Drug prices often vary depending on where the prescription is filled and some pharmacies may offer cheaper prices.  The website www.goodrx.com contains coupons for medications through different pharmacies. The prices here do not account for what the cost may be with help from insurance (it may be cheaper with your insurance), but the website can give you the price if you did not use any insurance.  - You can print the associated coupon and take it with your prescription to the pharmacy.  - You may also stop by our office during  regular business hours and pick up a GoodRx coupon card.  - If you need your prescription sent electronically to a different pharmacy, notify our office through [facility] or by phone at [phone] option 4.     Si Usted Necesita Algo Despus de Su Visita  Tambin puede enviarnos un mensaje a travs de Pharmacist, community. Por lo general respondemos a los mensajes de MyChart en el transcurso de 1 a 2 das hbiles.  Para renovar recetas, por favor pida a su farmacia que se ponga en contacto con nuestra oficina. Harland Dingwall de fax es [place] [phone].  Si tiene un asunto urgente cuando la clnica est cerrada y que no puede esperar  hasta el siguiente da hbil, puede llamar/localizar a su doctor(a) al nmero que aparece a continuacin.   Por favor, tenga en cuenta que aunque hacemos todo lo posible para estar disponibles para asuntos urgentes fuera del horario de [place], no estamos disponibles las 24 horas del da, los 7 das de la [place].   Si tiene un problema urgente y no puede comunicarse con nosotros, puede optar por buscar atencin mdica  en el consultorio de su doctor(a), en una clnica privada, en un centro de atencin urgente o en una sala de emergencias.  Si tiene Engineering geologist, por favor llame inmediatamente al 911 o vaya a la sala de emergencias.  Nmeros de bper  - Dr. Nehemiah Massed: [phone]  - Dra. Moye: [phone]  - Dra. Nicole Kindred: [phone]  En caso de inclemencias del [place], por favor llame a Johnsie Kindred principal al [phone] para una actualizacin sobre el [place] de cualquier retraso o cierre.  Consejos para la medicacin en dermatologa: Por favor, guarde las cajas en las que vienen los medicamentos de uso tpico para ayudarle a seguir las instrucciones sobre dnde y cmo usarlos. Las farmacias generalmente imprimen las instrucciones del medicamento slo en las cajas y no directamente en los tubos del [place].   Si su medicamento es muy  caro, por favor, pngase en contacto con Zigmund Daniel llamando al [phone] y presione la opcin 4 o envenos un mensaje a travs de Pharmacist, community.   No podemos decirle cul ser su copago por los medicamentos por adelantado ya que esto es diferente dependiendo de la cobertura de su seguro. Sin embargo, es posible que podamos encontrar un medicamento sustituto a Electrical engineer un formulario para que el seguro cubra el medicamento que se considera necesario.   Si se requiere una autorizacin previa para que su compaa de seguros [place] su medicamento, por favor permtanos de 1 a 2 das hbiles para completar este proceso.  Los precios de los medicamentos varan con frecuencia dependiendo del Environmental consultant de dnde se surte la receta y alguna farmacias pueden ofrecer precios ms baratos.  El sitio web www.goodrx.com tiene cupones para medicamentos de Airline pilot. Los precios aqu no tienen en cuenta lo que podra costar con la ayuda del seguro (puede ser ms barato con su seguro), pero el sitio web puede darle el precio si no utiliz Research scientist (physical sciences).  - Puede imprimir el cupn correspondiente y llevarlo con su receta a la farmacia.  - Tambin puede pasar por nuestra oficina durante el horario de atencin regular y Charity fundraiser una tarjeta de cupones de GoodRx.  - Si necesita que su receta se enve electrnicamente a una farmacia diferente, informe a nuestra oficina a travs de MyChart de [place] o por telfono llamando al [phone] y presione la opcin 4.

## 2022-05-28 ENCOUNTER — Encounter: Payer: Self-pay | Admitting: Dermatology

## 2022-05-30 ENCOUNTER — Telehealth: Payer: Self-pay | Admitting: *Deleted

## 2022-05-30 MED ORDER — VALACYCLOVIR HCL 1 G PO TABS: ORAL_TABLET | ORAL | 1 refills | Status: DC

## 2022-05-30 NOTE — Telephone Encounter (Signed)
Pt sent a message saying:  I have not taken the atorvastatin since 10/20 and have not taken the exemestane (aromasin) since 10/24. The pain in my legs is better but not completely gone. How long should I continue not taking both of these or what are next steps?  Also, I saw the dermatologist on Monday 10/30 and he froze a place just above my upper lip. It has given me a fever blister on my lip. Can you call in a prescription for the fever blister to [facility] on [address] in [place]?  Thank you.

## 2022-05-30 NOTE — Telephone Encounter (Signed)
Give it another week or two I sent in some valtrex Follow up if not improving

## 2022-05-30 NOTE — Telephone Encounter (Signed)
Sent mychart letting pt know Dr. Tower's comments. 

## 2022-06-18 ENCOUNTER — Encounter: Payer: Self-pay | Admitting: Nurse Practitioner

## 2022-06-18 ENCOUNTER — Other Ambulatory Visit: Payer: Self-pay

## 2022-06-18 MED ORDER — ROSUVASTATIN CALCIUM 5 MG PO TABS: 5.0000 mg | ORAL_TABLET | Freq: Every day | ORAL | 3 refills | Status: DC

## 2022-06-18 MED ORDER — LETROZOLE 2.5 MG PO TABS: 2.5000 mg | ORAL_TABLET | Freq: Every day | ORAL | 1 refills | Status: DC

## 2022-06-18 NOTE — Addendum Note (Signed)
Addended by: Loura Pardon A on: 06/18/2022 04:11 PM   Modules accepted: Orders

## 2022-07-01 DIAGNOSIS — M79641 Pain in right hand: Secondary | ICD-10-CM | POA: Diagnosis not present

## 2022-07-01 DIAGNOSIS — M720 Palmar fascial fibromatosis [Dupuytren]: Secondary | ICD-10-CM | POA: Diagnosis not present

## 2022-07-01 DIAGNOSIS — M79642 Pain in left hand: Secondary | ICD-10-CM | POA: Diagnosis not present

## 2022-07-03 ENCOUNTER — Ambulatory Visit
Admission: RE | Admit: 2022-07-03 | Discharge: 2022-07-03 | Disposition: A | Discharge status: Home / Self Care | Payer: BC Managed Care – PPO | Source: Ambulatory Visit | Attending: Nurse Practitioner | Admitting: Nurse Practitioner

## 2022-07-03 DIAGNOSIS — Z170 Estrogen receptor positive status [ER+]: Secondary | ICD-10-CM | POA: Diagnosis not present

## 2022-07-03 DIAGNOSIS — C50212 Malignant neoplasm of upper-inner quadrant of left female breast: Secondary | ICD-10-CM | POA: Diagnosis not present

## 2022-07-03 DIAGNOSIS — Z780 Asymptomatic menopausal state: Secondary | ICD-10-CM | POA: Diagnosis not present

## 2022-07-03 DIAGNOSIS — M85851 Other specified disorders of bone density and structure, right thigh: Secondary | ICD-10-CM | POA: Diagnosis not present

## 2022-07-11 ENCOUNTER — Other Ambulatory Visit: Payer: Self-pay | Admitting: Nurse Practitioner

## 2022-07-14 ENCOUNTER — Telehealth: Payer: Self-pay | Admitting: Family Medicine

## 2022-07-14 DIAGNOSIS — E7800 Pure hypercholesterolemia, unspecified: Secondary | ICD-10-CM

## 2022-07-14 NOTE — Telephone Encounter (Signed)
-----   Message from Velna Hatchet, RT sent at 07/01/2022  8:55 AM EST ----- Regarding: Mon 12/18 lab Lab orders needed for appt on 12/18.  Thanks, Anda Kraft

## 2022-07-15 ENCOUNTER — Other Ambulatory Visit (INDEPENDENT_AMBULATORY_CARE_PROVIDER_SITE_OTHER): Payer: BC Managed Care – PPO

## 2022-07-15 DIAGNOSIS — E7800 Pure hypercholesterolemia, unspecified: Secondary | ICD-10-CM

## 2022-07-15 LAB — LIPID PANEL
Cholesterol: 195 mg/dL (ref 0–200)
HDL: 62.7 mg/dL (ref >39.00)
LDL Cholesterol: 100 mg/dL — ABNORMAL HIGH (ref 0–99)
NonHDL: 132.18
Total CHOL/HDL Ratio: 3
Triglycerides: 163 mg/dL — ABNORMAL HIGH (ref 0.0–149.0)
VLDL: 32.6 mg/dL (ref 0.0–40.0)

## 2022-07-15 LAB — AST: AST: 19 U/L (ref 0–37)

## 2022-07-15 LAB — ALT: ALT: 20 U/L (ref 0–35)

## 2022-07-16 ENCOUNTER — Ambulatory Visit: Payer: BC Managed Care – PPO | Admitting: Dermatology

## 2022-07-16 ENCOUNTER — Encounter: Payer: Self-pay | Admitting: Family Medicine

## 2022-07-19 ENCOUNTER — Ambulatory Visit: Payer: BC Managed Care – PPO | Admitting: Internal Medicine

## 2022-07-19 ENCOUNTER — Encounter: Payer: Self-pay | Admitting: Medical Oncology

## 2022-07-19 ENCOUNTER — Inpatient Hospital Stay: Payer: BC Managed Care – PPO

## 2022-07-19 ENCOUNTER — Encounter: Payer: Self-pay | Admitting: Nurse Practitioner

## 2022-07-19 ENCOUNTER — Other Ambulatory Visit: Payer: Self-pay

## 2022-07-19 ENCOUNTER — Inpatient Hospital Stay: Payer: BC Managed Care – PPO | Attending: Internal Medicine | Admitting: Medical Oncology

## 2022-07-19 VITALS — BP 138/76 | HR 69 | Temp 97.9°F | Resp 18 | Ht 64.25 in | Wt 164.0 lb

## 2022-07-19 DIAGNOSIS — C50212 Malignant neoplasm of upper-inner quadrant of left female breast: Secondary | ICD-10-CM | POA: Diagnosis not present

## 2022-07-19 DIAGNOSIS — Z90710 Acquired absence of both cervix and uterus: Secondary | ICD-10-CM | POA: Insufficient documentation

## 2022-07-19 DIAGNOSIS — Z801 Family history of malignant neoplasm of trachea, bronchus and lung: Secondary | ICD-10-CM | POA: Diagnosis not present

## 2022-07-19 DIAGNOSIS — Z79811 Long term (current) use of aromatase inhibitors: Secondary | ICD-10-CM | POA: Insufficient documentation

## 2022-07-19 DIAGNOSIS — Z803 Family history of malignant neoplasm of breast: Secondary | ICD-10-CM | POA: Diagnosis not present

## 2022-07-19 DIAGNOSIS — Z170 Estrogen receptor positive status [ER+]: Secondary | ICD-10-CM

## 2022-07-19 DIAGNOSIS — I10 Essential (primary) hypertension: Secondary | ICD-10-CM | POA: Diagnosis not present

## 2022-07-19 DIAGNOSIS — Z87891 Personal history of nicotine dependence: Secondary | ICD-10-CM | POA: Diagnosis not present

## 2022-07-19 DIAGNOSIS — M2550 Pain in unspecified joint: Secondary | ICD-10-CM

## 2022-07-19 DIAGNOSIS — M8580 Other specified disorders of bone density and structure, unspecified site: Secondary | ICD-10-CM | POA: Diagnosis not present

## 2022-07-19 DIAGNOSIS — Z5181 Encounter for therapeutic drug level monitoring: Secondary | ICD-10-CM

## 2022-07-19 LAB — COMPREHENSIVE METABOLIC PANEL
ALT: 22 U/L (ref 0–44)
AST: 23 U/L (ref 15–41)
Albumin: 4.2 g/dL (ref 3.5–5.0)
Alkaline Phosphatase: 53 U/L (ref 38–126)
Anion gap: 10 (ref 5–15)
BUN: 15 mg/dL (ref 8–23)
CO2: 24 mmol/L (ref 22–32)
Calcium: 9.1 mg/dL (ref 8.9–10.3)
Chloride: 103 mmol/L (ref 98–111)
Creatinine, Ser: 0.77 mg/dL (ref 0.44–1.00)
GFR, Estimated: >60 mL/min (ref >60)
Glucose, Bld: 107 mg/dL — ABNORMAL HIGH (ref 70–99)
Potassium: 4.1 mmol/L (ref 3.5–5.1)
Sodium: 137 mmol/L (ref 135–145)
Total Bilirubin: 0.4 mg/dL (ref 0.3–1.2)
Total Protein: 7.5 g/dL (ref 6.5–8.1)

## 2022-07-19 LAB — CBC
HCT: 35.3 % — ABNORMAL LOW (ref 36.0–46.0)
Hemoglobin: 12 g/dL (ref 12.0–15.0)
MCH: 29.9 pg (ref 26.0–34.0)
MCHC: 34 g/dL (ref 30.0–36.0)
MCV: 87.8 fL (ref 80.0–100.0)
Platelets: 189 10*3/uL (ref 150–400)
RBC: 4.02 MIL/uL (ref 3.87–5.11)
RDW: 13 % (ref 11.5–15.5)
WBC: 4.7 10*3/uL (ref 4.0–10.5)
nRBC: 0 % (ref 0.0–0.2)

## 2022-07-19 MED ORDER — ERGOCALCIFEROL 1.25 MG (50000 UT) PO CAPS: 50000.0000 [IU] | ORAL_CAPSULE | ORAL | 3 refills | Status: DC

## 2022-07-19 NOTE — Progress Notes (Signed)
[facility] CONSULT NOTE  Patient Care Team: Tower, Wynelle Fanny, MD as PCP - General Noreene Filbert, MD as Referring Physician (Radiation Oncology) Rico Junker, RN as Oncology Nurse Navigator Herbert Pun, MD as Consulting Physician (General Surgery) Cammie Sickle, MD as Consulting Physician (Internal Medicine)  CHIEF COMPLAINTS/PURPOSE OF CONSULTATION: Breast cancer  #  Oncology History Overview Note  # LEFT BREAST CA- INVASIVE MAMMARY CARCINOMA, NO SPECIAL TYPE, WITH MICROPAPILLARY  FEATURES; ER/PR Positive [90%]; Her 2 NEG; G-1; 0.8 x 0.7 x 1 cm.  Oncotype recurrence score= 0; 3% distant recurrence; no chemotherapy; adjuvant radiation;  s/p RT [nov 5th, 2021];   # NOV end 221- START Anastrazole[previous HRT*]; JUNE 16th, 2023- Discontinue anastrozole; switch over to exemestane given patient's MSK side effects  # # SURVIVORSHIP:   # GENETICS: No pathogenic variants; VUS*  DIAGNOSIS: Cure  STAGE: 1        ;  GOALS: Cure  CURRENT/MOST RECENT THERAPY :     Carcinoma of upper-inner quadrant of left breast in female, estrogen receptor positive ([place])  03/01/2020 Initial Diagnosis   Carcinoma of upper-inner quadrant of left breast in female, estrogen receptor positive ([place])    Genetic Testing   Negative genetic testing. No pathogenic variants identified on the [facility] Multi-Cancer Panel. VUS in AIP called c.790_792del and VUS in RET called c.1163T>C identified. The report date is 04/07/2020.  The Multi-Cancer Panel offered by [facility] includes sequencing and/or deletion duplication testing of the following 85 genes: AIP, ALK, APC, ATM, AXIN2,BAP1,  BARD1, BLM, BMPR1A, BRCA1, BRCA2, BRIP1, CASR, CDC73, CDH1, CDK4, CDKN1B, CDKN1C, CDKN2A (p14ARF), CDKN2A (p16INK4a), CEBPA, CHEK2, CTNNA1, DICER1, DIS3L2, EGFR (c.2369C>T, p.Thr790Met variant only), EPCAM (Deletion/duplication testing only), FH, FLCN, GATA2, GPC3, GREM1 (Promoter region deletion/duplication  testing only), HOXB13 (c.251G>A, p.Gly84Glu), HRAS, KIT, MAX, MEN1, MET, MITF (c.952G>A, p.Glu318Lys variant only), MLH1, MSH2, MSH3, MSH6, MUTYH, NBN, NF1, NF2, NTHL1, PALB2, PDGFRA, PHOX2B, PMS2, POLD1, POLE, POT1, PRKAR1A, PTCH1, PTEN, RAD50, RAD51C, RAD51D, RB1, RECQL4, RET, RNF43, RUNX1, SDHAF2, SDHA (sequence changes only), SDHB, SDHC, SDHD, SMAD4, SMARCA4, SMARCB1, SMARCE1, STK11, SUFU, TERC, TERT, TMEM127, TP53, TSC1, TSC2, VHL, WRN and WT1.      HISTORY OF PRESENTING ILLNESS:  Brittney Johnson 66 y.o.  female diagnosis of stage I ER/PR positive HER-2 negative breast cancer on aromasin is here for follow up to discuss her joint pains. Thought to be a combination of her statin and AI. Was on atorvastatin; now on Crestor which has helped. Also taking COQ-10. PCP considering switching her to Niacin however she has a strong family history of hyperlipidemia. Of note, this previously occurred during anastrozole use but it did not improve with 1 week medication holiday.     Review of Systems  Constitutional:  Negative for chills, diaphoresis, fever, malaise/fatigue and weight loss.  HENT:  Negative for nosebleeds and sore throat.   Eyes:  Negative for double vision.  Respiratory:  Negative for cough, hemoptysis, sputum production, shortness of breath and wheezing.   Cardiovascular:  Negative for chest pain, palpitations, orthopnea and leg swelling.  Gastrointestinal:  Negative for abdominal pain, blood in stool, constipation, diarrhea, heartburn, melena, nausea and vomiting.  Genitourinary:  Negative for dysuria, frequency and urgency.  Musculoskeletal:  Positive for joint pain and myalgias. Negative for back pain.  Skin: Negative.  Negative for itching and rash.  Neurological:  Negative for dizziness, tingling, focal weakness, weakness and headaches.  Endo/Heme/Allergies:  Does not bruise/bleed easily.  Psychiatric/Behavioral:  Negative for depression. The  patient is not nervous/anxious and  does not have insomnia.      MEDICAL HISTORY:  Past Medical History:  Diagnosis Date   Atypical pneumonia 1998   not hosp   Breast cancer ([place]) 2021   left-sx, radation completed   DDD (degenerative disc disease), cervical    Family history of breast cancer    Family history of lung cancer    Hyperlipidemia    Hypertension    Low back pain    on and off   PONV (postoperative nausea and vomiting)    due to some sedations!   Syncope and collapse 07/16/2007    SURGICAL HISTORY: Past Surgical History:  Procedure Laterality Date   ABDOMINAL HYSTERECTOMY     supracervical- L ovary remains   BREAST BIOPSY Left 02/22/2020   Q clip [place] bx, [facility]   CARDIOVASCULAR STRESS TEST     08/28/06 normal    COLONOSCOPY  08/13/2016   [facility]   left hand surgery Left 2016   Dupuytrens   LUMBAR DISC SURGERY  08/2017   [facility]   OOPHORECTOMY Right    Right ovary removed    PARTIAL MASTECTOMY WITH NEEDLE LOCALIZATION AND AXILLARY SENTINEL LYMPH NODE BX Left 03/15/2020   Procedure: PARTIAL MASTECTOMY WITH RF TAG PLACEMENT AND AXILLARY SENTINEL LYMPH NODE BX;  Surgeon: Herbert Pun, MD;  Location: [facility] ORS;  Service: General;  Laterality: Left;   POLYPECTOMY     TUBAL LIGATION     ? 1979    SOCIAL HISTORY: Social History   Socioeconomic History   Marital status: Married    Spouse name: Not on file   Number of children: Not on file   Years of education: Not on file   Highest education level: Not on file  Occupational History   Occupation: Adult nurse: [employer]  Tobacco Use   Smoking status: Former    Packs/day: 0.50    Years: 5.00    Total pack years: 2.50    Types: Cigarettes    Quit date: 07/29/1997    Years since quitting: 24.9   Smokeless tobacco: Never  Vaping Use   Vaping Use: Never used  Substance and Sexual Activity   Alcohol use: Yes    Alcohol/week: 1.0 standard drink of alcohol    Types: 1 Glasses of wine per week   Drug use: No    Sexual activity: Yes  Other Topics Concern   Not on file  Social History Narrative   Kids live close by, 3 grandkids- really enjoys, runs for exercise, ran a 5k March 2011; walks; quit 20 years; rare alcohol. Work for [employer]; lives in [place]. 2 boys- 45s.    Social Determinants of Health   Financial Resource Strain: Not on file  Food Insecurity: Not on file  Transportation Needs: Not on file  Physical Activity: Not on file  Stress: Not on file  Social Connections: Not on file  Intimate Partner Violence: Not on file    FAMILY HISTORY: Family History  Problem Relation Age of Onset   Lung cancer Father 43   Hyperlipidemia Mother    Diabetes Mother    Hypertension Mother    Stroke Mother    Heart disease Other    Lung cancer Other        uncle   Diabetes Other        aunt   Coronary artery disease Maternal Grandfather    Breast cancer Maternal Aunt  dx under 56   Lung cancer Maternal Uncle    Lung cancer Paternal Uncle    Cancer Cousin        unk types   Cancer Cousin        unk types   Thyroid disease Neg Hx    Colon cancer Neg Hx    Colon polyps Neg Hx    Esophageal cancer Neg Hx    Rectal cancer Neg Hx    Stomach cancer Neg Hx     ALLERGIES:  is allergic to codeine, bee venom, and atorvastatin.  MEDICATIONS:  Current Outpatient Medications  Medication Sig Dispense Refill   amLODipine (NORVASC) 5 MG tablet Take 1 tablet (5 mg total) by mouth daily. 90 tablet 3   aspirin 81 MG EC tablet Take 81 mg by mouth every Monday, Wednesday, and Friday.      Calcium Citrate-Vitamin D (CALCIUM + D PO) Take by mouth.     ergocalciferol (VITAMIN D2) 1.25 MG (50000 UT) capsule Take 1 capsule (50,000 Units total) by mouth once a week. 4 capsule 3   letrozole (FEMARA) 2.5 MG tablet Take 1 tablet (2.5 mg total) by mouth daily. 30 tablet 1   Lifitegrast (XIIDRA) 5 % SOLN Place 1 drop into both eyes at bedtime.      lisinopril (ZESTRIL) 10 MG tablet Take 1 tablet  (10 mg total) by mouth daily. 90 tablet 3   Multiple Vitamin (MULTIVITAMIN) tablet Take 1 tablet by mouth daily.     Omega-3 Fatty Acids (FISH OIL) 1200 MG CAPS Take 1,200 mg by mouth 2 (two) times daily.     rosuvastatin (CRESTOR) 5 MG tablet Take 1 tablet (5 mg total) by mouth daily. 30 tablet 3   valACYclovir (VALTREX) 1000 MG tablet Take 2 pills by mouth every 12 hours for one day for a fever blister 4 tablet 1   No current facility-administered medications for this visit.      Marland Kitchen  PHYSICAL EXAMINATION: ECOG PERFORMANCE STATUS: 0 - Asymptomatic  Vitals:   07/19/22 1059  BP: 138/76  Pulse: 69  Resp: 18  Temp: 97.9 F (36.6 C)   Filed Weights   07/19/22 1059  Weight: 164 lb (74.4 kg)    Physical Exam Vitals reviewed.  Constitutional:      Appearance: She is not ill-appearing.  HENT:     Head: Atraumatic.  Cardiovascular:     Rate and Rhythm: Normal rate and regular rhythm.  Pulmonary:     Effort: Pulmonary effort is normal.     Breath sounds: Normal breath sounds.  Abdominal:     General: There is no distension.     Palpations: Abdomen is soft.  Musculoskeletal:        General: Deformity (contractures of hands) present. No tenderness.  Skin:    General: Skin is warm.  Neurological:     Mental Status: She is alert and oriented to person, place, and time.  Psychiatric:        Mood and Affect: Mood and affect normal.        Behavior: Behavior normal.    LABORATORY DATA:  I have reviewed the data as listed Lab Results  Component Value Date   WBC 4.7 07/19/2022   HGB 12.0 07/19/2022   HCT 35.3 (L) 07/19/2022   MCV 87.8 07/19/2022   PLT 189 07/19/2022   Recent Labs    04/18/22 0804 07/15/22 0918 07/19/22 1032  NA 139  --  137  K 4.5  --  4.1  CL 103  --  103  CO2 29  --  24  GLUCOSE 94  --  107*  BUN 17  --  15  CREATININE 0.83  --  0.77  CALCIUM 9.7  --  9.1  GFRNONAA  --   --  >60  PROT 7.0  --  7.5  ALBUMIN 4.2  --  4.2  AST _0 ALT  _1 ALKPHOS 48  --  53  BILITOT 0.5  --  0.4    RADIOGRAPHIC STUDIES: I have personally reviewed the radiological images as listed and agreed with the findings in the report. DG Bone Density  Result Date: 07/03/2022 EXAM: DUAL X-RAY ABSORPTIOMETRY (DXA) FOR BONE MINERAL DENSITY IMPRESSION: Dear Dr. Zenia Resides, Your patient Floy Sabina Luger completed a FRAX assessment on 07/03/2022 using the [place] (analysis version: 14.10) manufactured by [employer]. The following summarizes the results of our evaluation. PATIENT BIOGRAPHICAL: Name: Harveen, Flesch Patient ID: [id number] Birth Date: January 10, 1956 Height:    64.3 in. Gender:     [age]    [age]       Weight:    164.1 lbs. Ethnicity:  White                            Exam Date: 07/03/2022 FRAX* RESULTS:  (version: 3.5) 10-year Probability of Fracture1 Major Osteoporotic Fracture2 Hip Fracture 9.6% 1.2% Population: [place] (Caucasian) Risk Factors: None Based on Femur (Right) Neck BMD 1 -The 10-year probability of fracture may be lower than reported if the patient has received treatment. 2 -Major Osteoporotic Fracture: Clinical Spine, Forearm, Hip or Shoulder *FRAX is a Materials engineer of the [employer] for Metabolic Bone Disease, a [place] (WHO) [facility]. ASSESSMENT: The probability of a major osteoporotic fracture is 9.6% within the next ten years. The probability of a hip fracture is 1.2% within the next ten years. . Your patient Latessa Tillis completed a BMD test on 07/03/2022 using the [place] (software version: 14.10) manufactured by [employer]. The following summarizes the results of our evaluation. Technologist: [facility] PATIENT BIOGRAPHICAL: Name: Milessa, Hogan Patient ID: [id number] Birth Date: 1955-08-20 Height: 64.3 in. Gender: Female Exam Date: 07/03/2022 Weight: 164.1 lbs. Indications: Hysterectomy, History of Spinal Surgery,  Postmenopausal, Oophorectomy Unilateral, Vitamin D Deficiency, History of Breast Cancer, Caucasian, History of Radiation Fractures: Treatments: Albuterol, calcium w/ vit D, Letrozole, Multi-Vitamin DENSITOMETRY RESULTS: Site      Region     Measured Date Measured Age WHO Classification Young Adult T-score BMD         %Change vs. Previous Significant Change (*) AP Spine L1-L4 (L2) 07/03/2020 68.6 Normal 0.8 1.287 g/cm2 -4.3% Yes AP Spine L1-L4 (L2) 07[age]02/2021 67.6 Normal 1.3 1.345 g/cm2 1.4% - AP Spine L1-L4 (L2) 05[age]10/2019 65.5 Normal 1.1 1.326 g/cm2 - - DualFemur Neck Right 07/03/2022 68.6 Osteopenia -1.5 0.830 g/cm2 -2.0% - DualFemur Neck Right 07/02/2021 67.6 Osteopenia -1.4 0.847 g/cm2 -4.3% - DualFemur Neck Right 05/10/2019 65.5 Osteopenia -1.1 0.885 g/cm2 - - DualFemur Total Mean 07/03/2022 68.6 Normal -0.2 0.980 g/cm2 0.6% - DualFemur Total Mean 07/02/2021 67.6 Normal -0.3 0.974 g/cm2 1.0% - DualFemur Total Mean 05/10/2019 65.5 Normal -0.3 0.964 g/cm2 - - ASSESSMENT: The BMD measured at Femur Neck Right is 0.830 g/cm2 with a T-score of -1.5. This patient is considered  osteopenic according to [facility]) criteria. The scan quality is good. L-2 was excluded due to degenerative changes. Compared with prior study, there has been significant decrease in the spine. Compared with prior study, there has been no significant change in the total hip. World Pharmacologist [facility]) criteria for post-menopausal, Caucasian Women: Normal:                   T-score at or above -1 SD Osteopenia/low bone mass: T-score between -1 and -2.5 SD Osteoporosis:             T-score at or below -2.5 SD RECOMMENDATIONS: 1. All patients should optimize calcium and vitamin D intake. 2. Consider FDA-approved medical therapies in postmenopausal women and men aged 81 years and older, based on the following: a. A hip or vertebral(clinical or morphometric) fracture b. T-score < -2.5 at the femoral neck or spine after  appropriate evaluation to exclude secondary causes c. Low bone mass (T-score between -1.0 and -2.5 at the femoral neck or spine) and a 10-year probability of a hip fracture > 3% or a 10-year probability of a major osteoporosis-related fracture > 20% based on the [place]-adapted WHO algorithm 3. Clinician judgment and/or patient preferences may indicate treatment for people with 10-year fracture probabilities above or below these levels FOLLOW-UP: People with diagnosed cases of osteoporosis or at high risk for fracture should have regular bone mineral density tests. For patients eligible for Medicare, routine testing is allowed once every 2 years. The testing frequency can be increased to one year for patients who have rapidly progressing disease, those who are receiving or discontinuing medical therapy to restore bone mass, or have additional risk factors. I have reviewed this report, and agree with the above findings. [facility] Electronically Signed   By: Zerita Boers M.D.   On: 07/03/2022 09:31    05/01/21- Diagnostic breast tomo bilateral - bi-rads category 2: benign.    ASSESSMENT & PLAN:  Encounter Diagnoses  Name Primary?   Carcinoma of upper-inner quadrant of left breast in female, estrogen receptor positive ([place]) Yes   Encounter for monitoring aromatase inhibitor therapy    Pain in joint, multiple sites     Carcinoma of upper-inner quadrant of left breast in female, estrogen receptor positive ([place]) # Left breast cancer stage I ER/PR positive HER-2 negative; s/p lumpectomy; Low risk oncotype. on adjuvant anastrozole [until 2026-fall ] mammogram Oct 2022-Dr. Cintron. STABLE. Previously on anastrozole. Currently on aromasin.   Today I think it is reasonable for [place] to trial rx high dose vitamin D given her history of deficiency, osteopenia, recent lower than ideal level of vitamin D as well as the fact that her symptoms did not improve on drug holiday. She states that her pain is  much improved and more tolerable on current AI which is good. I think having her on crestor would be better than Niacin given her family and personal history. She can stop the COQ-10 since she has not seen much improvement in symptoms on this supplement and wants to decrease the amount of medications she is taking. Continue Aromasin. She has 3 years left of AI treatment.    DISPOSITION: Adding 50,000 IU Vit D once weekly Mychart in 1 month to let us know how she is feeling.  RTC March MD  No problem-specific Assessment & Plan notes found for this encounter.   Hughie Closs, PA-C 07/19/2022

## 2022-07-23 ENCOUNTER — Other Ambulatory Visit: Payer: BC Managed Care – PPO

## 2022-07-26 ENCOUNTER — Other Ambulatory Visit: Payer: Self-pay | Admitting: *Deleted

## 2022-07-26 MED ORDER — ROSUVASTATIN CALCIUM 5 MG PO TABS: 5.0000 mg | ORAL_TABLET | Freq: Every day | ORAL | 2 refills | Status: DC

## 2022-08-05 ENCOUNTER — Telehealth: Payer: Self-pay | Admitting: Medical Oncology

## 2022-08-05 NOTE — Telephone Encounter (Signed)
Hey this pt called and stated she never got her follow up appts. I looked at her last visit and there is no LOS. Is there a particular time you'd like her to come back in?  AA

## 2022-08-14 ENCOUNTER — Other Ambulatory Visit: Payer: Self-pay | Admitting: Nurse Practitioner

## 2022-08-19 ENCOUNTER — Inpatient Hospital Stay: Payer: BC Managed Care – PPO | Attending: Internal Medicine | Admitting: Medical Oncology

## 2022-08-19 DIAGNOSIS — M2550 Pain in unspecified joint: Secondary | ICD-10-CM | POA: Insufficient documentation

## 2022-08-19 DIAGNOSIS — Z713 Dietary counseling and surveillance: Secondary | ICD-10-CM

## 2022-08-19 DIAGNOSIS — M2559 Pain in other specified joint: Secondary | ICD-10-CM | POA: Diagnosis not present

## 2022-08-19 DIAGNOSIS — M25561 Pain in right knee: Secondary | ICD-10-CM

## 2022-08-19 DIAGNOSIS — Z801 Family history of malignant neoplasm of trachea, bronchus and lung: Secondary | ICD-10-CM | POA: Diagnosis not present

## 2022-08-19 DIAGNOSIS — Z87891 Personal history of nicotine dependence: Secondary | ICD-10-CM | POA: Diagnosis not present

## 2022-08-19 DIAGNOSIS — Z90710 Acquired absence of both cervix and uterus: Secondary | ICD-10-CM

## 2022-08-19 DIAGNOSIS — Z803 Family history of malignant neoplasm of breast: Secondary | ICD-10-CM

## 2022-08-19 NOTE — Progress Notes (Signed)
Virtual Visit Progress Note  Ms. Fluharty,you are scheduled for a virtual visit with your provider today.    Just as we do with appointments in the office, we must obtain your consent to participate.  Your consent will be active for this visit and any virtual visit you may have with one of our providers in the next 365 days.    If you have a MyChart account, I can also send a copy of this consent to you electronically.  All virtual visits are billed to your insurance company just like a traditional visit in the office.  As this is a virtual visit, video technology does not allow for your provider to perform a traditional examination.  This may limit your provider's ability to fully assess your condition.  If your provider identifies any concerns that need to be evaluated in person or the need to arrange testing such as labs, EKG, etc, we will make arrangements to do so.    Although advances in technology are sophisticated, we cannot ensure that it will always work on either your end or our end.  If the connection with a video visit is poor, we may have to switch to a telephone visit.  With either a video or telephone visit, we are not always able to ensure that we have a secure connection.   I need to obtain your verbal consent now.   Are you willing to proceed with your visit today?   Brittney Johnson has provided verbal consent on 08/19/2022 for a virtual visit (video or telephone).   Brittney Closs, PA-C 08/19/2022  3:56 PM    I connected with Brittney Johnson on 08/19/22 at  3:00 PM EST by video enabled telemedicine visit and verified that I am speaking with the correct person using two identifiers.   I discussed the limitations, risks, security and privacy concerns of performing an evaluation and management service by telemedicine and the availability of in-person appointments. I also discussed with the patient that there may be a patient responsible charge related to this service. The  patient expressed understanding and agreed to proceed.   Other persons participating in the visit and their role in the encounter: None   Patient's location: Home  Provider's location: Clinic   Chief Complaint: Joint Pains from [place]: Patient is on Letrozole for prevention of recurrence of her breast cancer. Her last visit was a [facility] visit due to continued joint pains while on this medication. Previously was on anastarole which caused worse symptoms. Also on statin medication which was thought to potentially contribute however patient has a strong family history of hyperlipidemia and continuation was preferred. I had suggested rx vitamin d supplementation. Previous Vit D level was low normal at 34.70 on 04/18/2022.  Today she states that prior to the prescription strength vitamin D she rated her pain as 6-7/10 aching like pain. Now right leg 2/10 and left is 5/10. No trouble with the vitamin D supplement but has switched to calcium citrate and has had some increased bloating and flatulence. Otherwise tolerating well.     Patient Care Team: Tower, Wynelle Fanny, MD as PCP - General Noreene Filbert, MD as Referring Physician (Radiation Oncology) Rico Junker, RN as Oncology Nurse Navigator Herbert Pun, MD as Consulting Physician (General Surgery) Cammie Sickle, MD as Consulting Physician (Internal Medicine)   Name of the patient: Brittney Johnson  [id number]  Mar 09, 1956   Date of visit: 08/19/22  History of  Presenting Illness-   Review of systems- ROS   Allergies  Allergen Reactions   Codeine     REACTION: passed out   Bee Venom Swelling   Atorvastatin     Leg pain     Past Medical History:  Diagnosis Date   Atypical pneumonia 1998   not hosp   Breast cancer ([place]) 2021   left-sx, radation completed   DDD (degenerative disc disease), cervical    Family history of breast cancer    Family history of lung cancer    Hyperlipidemia    Hypertension     Low back pain    on and off   PONV (postoperative nausea and vomiting)    due to some sedations!   Syncope and collapse 07/16/2007    Past Surgical History:  Procedure Laterality Date   ABDOMINAL HYSTERECTOMY     supracervical- L ovary remains   BREAST BIOPSY Left 02/22/2020   Q clip [place] bx, [facility]   CARDIOVASCULAR STRESS TEST     08/28/06 normal    COLONOSCOPY  08/13/2016   [facility]le   left hand surgery Left 2016   Dupuytrens   LUMBAR DISC SURGERY  08/2017   [facility]   OOPHORECTOMY Right    Right ovary removed    PARTIAL MASTECTOMY WITH NEEDLE LOCALIZATION AND AXILLARY SENTINEL LYMPH NODE BX Left 03/15/2020   Procedure: PARTIAL MASTECTOMY WITH RF TAG PLACEMENT AND AXILLARY SENTINEL LYMPH NODE BX;  Surgeon: Herbert Pun, MD;  Location: [facility] ORS;  Service: General;  Laterality: Left;   POLYPECTOMY     TUBAL LIGATION     ? 1979    Social History   Socioeconomic History   Marital status: Married    Spouse name: Not on file   Number of children: Not on file   Years of education: Not on file   Highest education level: Not on file  Occupational History   Occupation: Adult nurse: [employer]  Tobacco Use   Smoking status: Former    Packs/day: 0.50    Years: 5.00    Total pack years: 2.50    Types: Cigarettes    Quit date: 07/29/1997    Years since quitting: 25.0   Smokeless tobacco: Never  Vaping Use   Vaping Use: Never used  Substance and Sexual Activity   Alcohol use: Yes    Alcohol/week: 1.0 standard drink of alcohol    Types: 1 Glasses of wine per week   Drug use: No   Sexual activity: Yes  Other Topics Concern   Not on file  Social History Narrative   Kids live close by, 3 grandkids- really enjoys, runs for exercise, ran a 5k March 2011; walks; quit 20 years; rare alcohol. Work for [employer]; lives in [place]. 2 boys- 22s.    Social Determinants of Health   Financial Resource Strain: Not on file  Food Insecurity: Not on file   Transportation Needs: Not on file  Physical Activity: Not on file  Stress: Not on file  Social Connections: Not on file  Intimate Partner Violence: Not on file    Immunization History  Administered Date(s) Administered   Fluad Quad(high Dose 65+) 04/19/2019, 05/24/2020, 04/23/2021, 04/25/2022   Influenza Split 05/30/2011, 04/28/2012   Influenza Whole 04/19/2010   Influenza,inj,Quad PF,6+ Mos 04/20/2013, 05/11/2014, 04/20/2015, 04/09/2016, 04/11/2017, 04/13/2018   PFIZER(Purple Top)SARS-COV-2 Vaccination 10/04/2019, 10/27/2019, 07/10/2020   Pneumococcal Conjugate-13 04/19/2019   Pneumococcal Polysaccharide-23 07/29/2005, 04/19/2020   Tdap 12/05/2010, 01/06/2020   Zoster,  Live 05/23/2015    Family History  Problem Relation Age of Onset   Lung cancer Father 17   Hyperlipidemia Mother    Diabetes Mother    Hypertension Mother    Stroke Mother    Heart disease Other    Lung cancer Other        uncle   Diabetes Other        aunt   Coronary artery disease Maternal Grandfather    Breast cancer Maternal Aunt        dx under 36   Lung cancer Maternal Uncle    Lung cancer Paternal Uncle    Cancer Cousin        unk types   Cancer Cousin        unk types   Thyroid disease Neg Hx    Colon cancer Neg Hx    Colon polyps Neg Hx    Esophageal cancer Neg Hx    Rectal cancer Neg Hx    Stomach cancer Neg Hx      Current Outpatient Medications:    amLODipine (NORVASC) 5 MG tablet, Take 1 tablet (5 mg total) by mouth daily., Disp: 90 tablet, Rfl: 3   aspirin 81 MG EC tablet, Take 81 mg by mouth every Monday, Wednesday, and Friday. , Disp: , Rfl:    Calcium Citrate-Vitamin D (CALCIUM + D PO), Take by mouth., Disp: , Rfl:    ergocalciferol (VITAMIN D2) 1.25 MG (50000 UT) capsule, Take 1 capsule (50,000 Units total) by mouth once a week., Disp: 4 capsule, Rfl: 3   letrozole (FEMARA) 2.5 MG tablet, TAKE 1 TABLET BY MOUTH EVERY DAY, Disp: 30 tablet, Rfl: 1   Lifitegrast (XIIDRA) 5 %  SOLN, Place 1 drop into both eyes at bedtime. , Disp: , Rfl:    lisinopril (ZESTRIL) 10 MG tablet, Take 1 tablet (10 mg total) by mouth daily., Disp: 90 tablet, Rfl: 3   Multiple Vitamin (MULTIVITAMIN) tablet, Take 1 tablet by mouth daily., Disp: , Rfl:    Omega-3 Fatty Acids (FISH OIL) 1200 MG CAPS, Take 1,200 mg by mouth 2 (two) times daily., Disp: , Rfl:    rosuvastatin (CRESTOR) 5 MG tablet, Take 1 tablet (5 mg total) by mouth daily., Disp: 90 tablet, Rfl: 2   valACYclovir (VALTREX) 1000 MG tablet, Take 2 pills by mouth every 12 hours for one day for a fever blister, Disp: 4 tablet, Rfl: 1  Physical exam: Exam limited due to telemedicine Physical Exam    Assessment and plan- Patient is a 67 y.o. female  Encounter Diagnoses  Name Primary?   Arthralgia of both lower legs Yes   Nutritional counseling    Improving with prescription strength vitamin D supplementation. She will continue this medication. She can also return to her previous vitamin D supplement which she tolerated better (this had 1,000 IU vitamin D3). Our plan will be for her to return in 2 months for labs and follow up. She has a survivorship follow up planned for this time which works out well. We will add on a lab appointment a day ahead to recheck vitamin D and B12 (as she has also stopped her multivitamin as this also contained D3)   Vitamin B12 and Vitamin D labs ordered as future lab orders. Scheduling to add lab only appointment 1-3 day ahead of her scheduled follow up on 10/07/2022.    Visit Diagnosis 1. Arthralgia of both lower legs   2. Nutritional counseling     Patient  expressed understanding and was in agreement with this plan. She also understands that She can call clinic at any time with any questions, concerns, or complaints.   I discussed the assessment and treatment plan with the patient. The patient was provided an opportunity to ask questions and all were answered. The patient agreed with the plan and  demonstrated an understanding of the instructions.   The patient was advised to call back or seek an in-person evaluation if the symptoms worsen or if the condition fails to improve as anticipated.   I spent 10 minutes face-to-face video visit time dedicated to the care of this patient on the date of this encounter to include pre-visit review of recent labs and recent oncology visits, face-to-face time with the patient, and post visit ordering of testing/documentation.   Thank you for allowing me to participate in the care of this very pleasant patient.   Nelwyn Salisbury PA-C

## 2022-08-20 ENCOUNTER — Ambulatory Visit: Payer: BC Managed Care – PPO | Admitting: Dermatology

## 2022-09-10 ENCOUNTER — Ambulatory Visit: Payer: BC Managed Care – PPO | Admitting: Dermatology

## 2022-09-14 ENCOUNTER — Other Ambulatory Visit: Payer: Self-pay | Admitting: Family Medicine

## 2022-10-01 ENCOUNTER — Other Ambulatory Visit: Payer: Self-pay | Admitting: *Deleted

## 2022-10-01 DIAGNOSIS — Z170 Estrogen receptor positive status [ER+]: Secondary | ICD-10-CM

## 2022-10-02 ENCOUNTER — Inpatient Hospital Stay: Payer: BC Managed Care – PPO | Attending: Internal Medicine

## 2022-10-02 DIAGNOSIS — Z801 Family history of malignant neoplasm of trachea, bronchus and lung: Secondary | ICD-10-CM | POA: Diagnosis not present

## 2022-10-02 DIAGNOSIS — Z87891 Personal history of nicotine dependence: Secondary | ICD-10-CM | POA: Diagnosis not present

## 2022-10-02 DIAGNOSIS — Z170 Estrogen receptor positive status [ER+]: Secondary | ICD-10-CM | POA: Insufficient documentation

## 2022-10-02 DIAGNOSIS — Z803 Family history of malignant neoplasm of breast: Secondary | ICD-10-CM | POA: Diagnosis not present

## 2022-10-02 DIAGNOSIS — C50212 Malignant neoplasm of upper-inner quadrant of left female breast: Secondary | ICD-10-CM | POA: Insufficient documentation

## 2022-10-02 DIAGNOSIS — M25561 Pain in right knee: Secondary | ICD-10-CM

## 2022-10-02 DIAGNOSIS — Z79811 Long term (current) use of aromatase inhibitors: Secondary | ICD-10-CM | POA: Insufficient documentation

## 2022-10-02 DIAGNOSIS — M8580 Other specified disorders of bone density and structure, unspecified site: Secondary | ICD-10-CM | POA: Diagnosis not present

## 2022-10-02 LAB — CMP (CANCER CENTER ONLY)
ALT: 20 U/L (ref 0–44)
AST: 20 U/L (ref 15–41)
Albumin: 4.6 g/dL (ref 3.5–5.0)
Alkaline Phosphatase: 61 U/L (ref 38–126)
Anion gap: 10 (ref 5–15)
BUN: 16 mg/dL (ref 8–23)
CO2: 25 mmol/L (ref 22–32)
Calcium: 10.1 mg/dL (ref 8.9–10.3)
Chloride: 100 mmol/L (ref 98–111)
Creatinine: 0.71 mg/dL (ref 0.44–1.00)
GFR, Estimated: >60 mL/min (ref >60)
Glucose, Bld: 99 mg/dL (ref 70–99)
Potassium: 4.2 mmol/L (ref 3.5–5.1)
Sodium: 135 mmol/L (ref 135–145)
Total Bilirubin: 0.4 mg/dL (ref 0.3–1.2)
Total Protein: 8.2 g/dL — ABNORMAL HIGH (ref 6.5–8.1)

## 2022-10-02 LAB — CBC WITH DIFFERENTIAL (CANCER CENTER ONLY)
Abs Immature Granulocytes: 0.01 10*3/uL (ref 0.00–0.07)
Basophils Absolute: 0.1 10*3/uL (ref 0.0–0.1)
Basophils Relative: 1 %
Eosinophils Absolute: 0.1 10*3/uL (ref 0.0–0.5)
Eosinophils Relative: 1 %
HCT: 39 % (ref 36.0–46.0)
Hemoglobin: 12.8 g/dL (ref 12.0–15.0)
Immature Granulocytes: 0 %
Lymphocytes Relative: 35 %
Lymphs Abs: 1.6 10*3/uL (ref 0.7–4.0)
MCH: 29.2 pg (ref 26.0–34.0)
MCHC: 32.8 g/dL (ref 30.0–36.0)
MCV: 88.8 fL (ref 80.0–100.0)
Monocytes Absolute: 0.5 10*3/uL (ref 0.1–1.0)
Monocytes Relative: 12 %
Neutro Abs: 2.3 10*3/uL (ref 1.7–7.7)
Neutrophils Relative %: 51 %
Platelet Count: 227 10*3/uL (ref 150–400)
RBC: 4.39 MIL/uL (ref 3.87–5.11)
RDW: 13.4 % (ref 11.5–15.5)
WBC Count: 4.5 10*3/uL (ref 4.0–10.5)
nRBC: 0 % (ref 0.0–0.2)

## 2022-10-02 LAB — VITAMIN B12: Vitamin B-12: 744 pg/mL (ref 180–914)

## 2022-10-02 LAB — VITAMIN D 25 HYDROXY (VIT D DEFICIENCY, FRACTURES): Vit D, 25-Hydroxy: 61.04 ng/mL (ref 30–100)

## 2022-10-07 ENCOUNTER — Inpatient Hospital Stay (HOSPITAL_BASED_OUTPATIENT_CLINIC_OR_DEPARTMENT_OTHER): Payer: BC Managed Care – PPO | Admitting: Internal Medicine

## 2022-10-07 VITALS — BP 132/79 | HR 70 | Temp 97.7°F | Resp 16 | Wt 167.2 lb

## 2022-10-07 DIAGNOSIS — Z170 Estrogen receptor positive status [ER+]: Secondary | ICD-10-CM | POA: Diagnosis not present

## 2022-10-07 DIAGNOSIS — M8580 Other specified disorders of bone density and structure, unspecified site: Secondary | ICD-10-CM | POA: Diagnosis not present

## 2022-10-07 DIAGNOSIS — C50212 Malignant neoplasm of upper-inner quadrant of left female breast: Secondary | ICD-10-CM | POA: Diagnosis not present

## 2022-10-07 DIAGNOSIS — Z79811 Long term (current) use of aromatase inhibitors: Secondary | ICD-10-CM | POA: Diagnosis not present

## 2022-10-07 DIAGNOSIS — Z801 Family history of malignant neoplasm of trachea, bronchus and lung: Secondary | ICD-10-CM | POA: Diagnosis not present

## 2022-10-07 DIAGNOSIS — Z87891 Personal history of nicotine dependence: Secondary | ICD-10-CM | POA: Diagnosis not present

## 2022-10-07 DIAGNOSIS — Z803 Family history of malignant neoplasm of breast: Secondary | ICD-10-CM | POA: Diagnosis not present

## 2022-10-07 NOTE — Progress Notes (Signed)
[facility] NOTE  Patient Care Team: Tower, Wynelle Fanny, MD as PCP - General Noreene Filbert, MD as Referring Physician (Radiation Oncology) Rico Junker, RN as Oncology Nurse Navigator Herbert Pun, MD as Consulting Physician (General Surgery) Cammie Sickle, MD as Consulting Physician (Internal Medicine)  CHIEF COMPLAINTS/PURPOSE OF CONSULTATION: Breast cancer  #  Oncology History Overview Note  # LEFT BREAST CA- INVASIVE MAMMARY CARCINOMA, NO SPECIAL TYPE, WITH MICROPAPILLARY  FEATURES; ER/PR Positive [90%]; Her 2 NEG; G-1; 0.8 x 0.7 x 1 cm.  Oncotype recurrence score= 0; 3% distant recurrence; no chemotherapy; adjuvant radiation;  s/p RT [nov 5th, 2021];   # NOV end 221- START Anastrazole[previous HRT*]; JUNE 16th, 2023- Discontinue anastrozole; switch over to exemestane given patient's MSK side effects  # # SURVIVORSHIP:   # GENETICS: No pathogenic variants; VUS*  DIAGNOSIS: Cure  STAGE: 1        ;  GOALS: Cure  CURRENT/MOST RECENT THERAPY :     Carcinoma of upper-inner quadrant of left breast in female, estrogen receptor positive ([place])  03/01/2020 Initial Diagnosis   Carcinoma of upper-inner quadrant of left breast in female, estrogen receptor positive ([place])    Genetic Testing   Negative genetic testing. No pathogenic variants identified on the [facility] Multi-Cancer Panel. VUS in AIP called c.790_792del and VUS in RET called c.1163T>C identified. The report date is 04/07/2020.  The Multi-Cancer Panel offered by [facility] includes sequencing and/or deletion duplication testing of the following 85 genes: AIP, ALK, APC, ATM, AXIN2,BAP1,  BARD1, BLM, BMPR1A, BRCA1, BRCA2, BRIP1, CASR, CDC73, CDH1, CDK4, CDKN1B, CDKN1C, CDKN2A (p14ARF), CDKN2A (p16INK4a), CEBPA, CHEK2, CTNNA1, DICER1, DIS3L2, EGFR (c.2369C>T, p.Thr790Met variant only), EPCAM (Deletion/duplication testing only), FH, FLCN, GATA2, GPC3, GREM1 (Promoter region deletion/duplication  testing only), HOXB13 (c.251G>A, p.Gly84Glu), HRAS, KIT, MAX, MEN1, MET, MITF (c.952G>A, p.Glu318Lys variant only), MLH1, MSH2, MSH3, MSH6, MUTYH, NBN, NF1, NF2, NTHL1, PALB2, PDGFRA, PHOX2B, PMS2, POLD1, POLE, POT1, PRKAR1A, PTCH1, PTEN, RAD50, RAD51C, RAD51D, RB1, RECQL4, RET, RNF43, RUNX1, SDHAF2, SDHA (sequence changes only), SDHB, SDHC, SDHD, SMAD4, SMARCA4, SMARCB1, SMARCE1, STK11, SUFU, TERC, TERT, TMEM127, TP53, TSC1, TSC2, VHL, WRN and WT1.     HISTORY OF PRESENTING ILLNESS: Patient ambulating-independently.   Alone.  Brittney Johnson 67 y.o.  female diagnosis of stage I ER/PR positive HER-2 negative breast cancer on letrozole is here for follow-up/review results of the mammogram/bone density..  No side effects from letrozole. Energy is good. Appetite is normal. Has chronic bil leg pain. Breast tenderness from old surgery site.  Patient notes that improvement of her joint pains currently on vitamin D2 50,000 units/weekly.  Denies any nausea vomiting abdominal pain.  No fever no chills.  Review of Systems  Constitutional:  Negative for chills, diaphoresis, fever, malaise/fatigue and weight loss.  HENT:  Negative for nosebleeds and sore throat.   Eyes:  Negative for double vision.  Respiratory:  Negative for cough, hemoptysis, sputum production, shortness of breath and wheezing.   Cardiovascular:  Negative for chest pain, palpitations, orthopnea and leg swelling.  Gastrointestinal:  Negative for abdominal pain, blood in stool, constipation, diarrhea, heartburn, melena, nausea and vomiting.  Genitourinary:  Negative for dysuria, frequency and urgency.  Musculoskeletal:  Positive for joint pain and myalgias. Negative for back pain.  Skin: Negative.  Negative for itching and rash.  Neurological:  Negative for dizziness, tingling, focal weakness, weakness and headaches.  Endo/Heme/Allergies:  Does not bruise/bleed easily.  Psychiatric/Behavioral:  Negative for depression. The patient is  not nervous/anxious  and does not have insomnia.      MEDICAL HISTORY:  Past Medical History:  Diagnosis Date   Atypical pneumonia 1998   not hosp   Breast cancer ([place]) 2021   left-sx, radation completed   DDD (degenerative disc disease), cervical    Family history of breast cancer    Family history of lung cancer    Hyperlipidemia    Hypertension    Low back pain    on and off   PONV (postoperative nausea and vomiting)    due to some sedations!   Syncope and collapse 07/16/2007    SURGICAL HISTORY: Past Surgical History:  Procedure Laterality Date   ABDOMINAL HYSTERECTOMY     supracervical- L ovary remains   BREAST BIOPSY Left 02/22/2020   Q clip [place] bx, [facility]   CARDIOVASCULAR STRESS TEST     08/28/06 normal    COLONOSCOPY  08/13/2016   Pyrtle   left hand surgery Left 2016   Dupuytrens   LUMBAR DISC SURGERY  08/2017   [facility]   OOPHORECTOMY Right    Right ovary removed    PARTIAL MASTECTOMY WITH NEEDLE LOCALIZATION AND AXILLARY SENTINEL LYMPH NODE BX Left 03/15/2020   Procedure: PARTIAL MASTECTOMY WITH RF TAG PLACEMENT AND AXILLARY SENTINEL LYMPH NODE BX;  Surgeon: Herbert Pun, MD;  Location: [facility] ORS;  Service: General;  Laterality: Left;   POLYPECTOMY     TUBAL LIGATION     ? 1979    SOCIAL HISTORY: Social History   Socioeconomic History   Marital status: Married    Spouse name: Not on file   Number of children: Not on file   Years of education: Not on file   Highest education level: Not on file  Occupational History   Occupation: Adult nurse: [employer]  Tobacco Use   Smoking status: Former    Packs/day: 0.50    Years: 5.00    Total pack years: 2.50    Types: Cigarettes    Quit date: 07/29/1997    Years since quitting: 25.2   Smokeless tobacco: Never  Vaping Use   Vaping Use: Never used  Substance and Sexual Activity   Alcohol use: Yes    Alcohol/week: 1.0 standard drink of alcohol    Types: 1 Glasses of wine  per week   Drug use: No   Sexual activity: Yes  Other Topics Concern   Not on file  Social History Narrative   Kids live close by, 3 grandkids- really enjoys, runs for exercise, ran a 5k March 2011; walks; quit 20 years; rare alcohol. Work for [employer]; lives in [place]. 2 boys- 4s.    Social Determinants of Health   Financial Resource Strain: Not on file  Food Insecurity: Not on file  Transportation Needs: Not on file  Physical Activity: Not on file  Stress: Not on file  Social Connections: Not on file  Intimate Partner Violence: Not on file    FAMILY HISTORY: Family History  Problem Relation Age of Onset   Lung cancer Father 7   Hyperlipidemia Mother    Diabetes Mother    Hypertension Mother    Stroke Mother    Heart disease Other    Lung cancer Other        uncle   Diabetes Other        aunt   Coronary artery disease Maternal Grandfather    Breast cancer Maternal Aunt        dx under 100  Lung cancer Maternal Uncle    Lung cancer Paternal Uncle    Cancer Cousin        unk types   Cancer Cousin        unk types   Thyroid disease Neg Hx    Colon cancer Neg Hx    Colon polyps Neg Hx    Esophageal cancer Neg Hx    Rectal cancer Neg Hx    Stomach cancer Neg Hx     ALLERGIES:  is allergic to codeine, bee venom, and atorvastatin.  MEDICATIONS:  Current Outpatient Medications  Medication Sig Dispense Refill   amLODipine (NORVASC) 5 MG tablet Take 1 tablet (5 mg total) by mouth daily. 90 tablet 3   aspirin 81 MG EC tablet Take 81 mg by mouth every Monday, Wednesday, and Friday.      Calcium Citrate-Vitamin D (CALCIUM + D PO) Take by mouth.     ergocalciferol (VITAMIN D2) 1.25 MG (50000 UT) capsule Take 1 capsule (50,000 Units total) by mouth once a week. 4 capsule 3   letrozole (FEMARA) 2.5 MG tablet TAKE 1 TABLET BY MOUTH EVERY DAY 30 tablet 1   Lifitegrast (XIIDRA) 5 % SOLN Place 1 drop into both eyes at bedtime.      lisinopril (ZESTRIL) 10 MG tablet  Take 1 tablet (10 mg total) by mouth daily. 90 tablet 3   Omega-3 Fatty Acids (FISH OIL) 1200 MG CAPS Take 1,200 mg by mouth 2 (two) times daily.     rosuvastatin (CRESTOR) 5 MG tablet TAKE 1 TABLET (5 MG TOTAL) BY MOUTH DAILY. 90 tablet 1   No current facility-administered medications for this visit.      Marland Kitchen  PHYSICAL EXAMINATION: ECOG PERFORMANCE STATUS: 0 - Asymptomatic  Vitals:   10/07/22 1329  BP: 132/79  Pulse: 70  Resp: 16  Temp: 97.7 F (36.5 C)  SpO2: 99%   Filed Weights   10/07/22 1329  Weight: 167 lb 3.2 oz (75.8 kg)    Physical Exam HENT:     Head: Normocephalic and atraumatic.     Mouth/Throat:     Pharynx: No oropharyngeal exudate.  Eyes:     Pupils: Pupils are equal, round, and reactive to light.  Cardiovascular:     Rate and Rhythm: Normal rate and regular rhythm.  Pulmonary:     Effort: Pulmonary effort is normal. No respiratory distress.     Breath sounds: Normal breath sounds. No wheezing.  Abdominal:     General: Bowel sounds are normal. There is no distension.     Palpations: Abdomen is soft. There is no mass.     Tenderness: There is no abdominal tenderness. There is no guarding or rebound.  Musculoskeletal:        General: No tenderness. Normal range of motion.     Cervical back: Normal range of motion and neck supple.  Skin:    General: Skin is warm.  Neurological:     Mental Status: She is alert and oriented to person, place, and time.  Psychiatric:        Mood and Affect: Affect normal.    LABORATORY DATA:  I have reviewed the data as listed Lab Results  Component Value Date   WBC 4.5 10/02/2022   HGB 12.8 10/02/2022   HCT 39.0 10/02/2022   MCV 88.8 10/02/2022   PLT 227 10/02/2022   Recent Labs    04/18/22 0804 07/15/22 0918 07/19/22 1032 10/02/22 1315  NA 139  --  137  135  K 4.5  --  4.1 4.2  CL 103  --  103 100  CO2 29  --  24 25  GLUCOSE 94  --  107* 99  BUN 17  --  15 16  CREATININE 0.83  --  0.77 0.71  CALCIUM  9.7  --  9.1 10.1  GFRNONAA  --   --  >60 >60  PROT 7.0  --  7.5 8.2*  ALBUMIN 4.2  --  4.2 4.6  AST '17 19 23 20  '$ ALT '18 20 22 20  '$ ALKPHOS 48  --  53 61  BILITOT 0.5  --  0.4 0.4    RADIOGRAPHIC STUDIES: I have personally reviewed the radiological images as listed and agreed with the findings in the report. No results found.  ASSESSMENT & PLAN:   Carcinoma of upper-inner quadrant of left breast in female, estrogen receptor positive ([place]) #Left breast cancer stage I ER/PR positive HER-2 negative; s/p lumpectomy; on adjuvant letrozole until 2026-fall ] mammogram Oct 2023-Dr. Cintron. Stable.   # On letrozole tolerating  fairly well.  Again reviewed that she will continue until fall 2026.  # Osteopenia-reviewed OCT 2022- T score+ -1.4-osteopenia; DEC 2023-  T-score of -1.5. Given the stability of the bone density recommend continued monitoring. Patient physically active/exercising.  continue  MVT- Ca 1200 mg+ 1000+  vit D 2000 mg/day.   MARCH 2024- vit D 61.  Recommend stopping vitamin D 50,000 units weekly.  # Weight gain- 23 pounds-  Discussed importance of healthy weight/and weight loss.  Strongly recommend eating more green leafy vegetables and cutting down processed food/ carbohydrates.  Instead increasing whole grains / protein in the diet.  Multiple studies have shown that optimal weight would help improve cardiovascular risk; also shown to cut on the risk of malignancies-colon cancer, breast cancer ovarian/uterine cancer in women.   # Slightly elevated Protein-no major clinical concerns at this time.  CBC is within normal limits.  Will repeat in 3 months-if rising would consider further workup.  mychart # DISPOSITION: # Follow-up in 3 months-MD; cbc/cmp-  Dr. Earley Abide, MD 10/07/2022 2:37 PM

## 2022-10-07 NOTE — Progress Notes (Signed)
No side effects from letrozole. Energy is good. Appetite is normal. Has chronic bil leg pain. Breast tenderness from old surgery site.

## 2022-10-07 NOTE — Assessment & Plan Note (Addendum)
#  Left breast cancer stage I ER/PR positive HER-2 negative; s/p lumpectomy; on adjuvant letrozole until 2026-fall ] mammogram Oct 2023-Dr. Cintron. Stable.   # On letrozole tolerating  fairly well.  Again reviewed that she will continue until fall 2026.  # Osteopenia-reviewed OCT 2022- T score+ -1.4-osteopenia; DEC 2023-  T-score of -1.5. Given the stability of the bone density recommend continued monitoring. Patient physically active/exercising.  continue  MVT- Ca 1200 mg+ 1000+  vit D 2000 mg/day.   MARCH 2024- vit D 61.  Recommend stopping vitamin D 50,000 units weekly.  # Weight gain- 23 pounds-  Discussed importance of healthy weight/and weight loss.  Strongly recommend eating more green leafy vegetables and cutting down processed food/ carbohydrates.  Instead increasing whole grains / protein in the diet.  Multiple studies have shown that optimal weight would help improve cardiovascular risk; also shown to cut on the risk of malignancies-colon cancer, breast cancer ovarian/uterine cancer in women.   # Slightly elevated Protein-no major clinical concerns at this time.  CBC is within normal limits.  Will repeat in 3 months-if rising would consider further workup.  mychart # DISPOSITION: # Follow-up in 3 months-MD; cbc/cmp-  Dr. Jacinto Reap.

## 2022-10-10 ENCOUNTER — Other Ambulatory Visit: Payer: Self-pay | Admitting: Nurse Practitioner

## 2022-10-23 ENCOUNTER — Ambulatory Visit
Admission: RE | Admit: 2022-10-23 | Discharge: 2022-10-23 | Disposition: A | Discharge status: Home / Self Care | Payer: BC Managed Care – PPO | Source: Ambulatory Visit | Attending: Radiation Oncology | Admitting: Radiation Oncology

## 2022-10-23 ENCOUNTER — Encounter: Payer: Self-pay | Admitting: Radiation Oncology

## 2022-10-23 VITALS — BP 124/71 | HR 75 | Temp 98.9°F | Resp 16 | Wt 167.9 lb

## 2022-10-23 DIAGNOSIS — Z923 Personal history of irradiation: Secondary | ICD-10-CM | POA: Diagnosis not present

## 2022-10-23 DIAGNOSIS — C50912 Malignant neoplasm of unspecified site of left female breast: Secondary | ICD-10-CM | POA: Diagnosis not present

## 2022-10-23 DIAGNOSIS — Z79811 Long term (current) use of aromatase inhibitors: Secondary | ICD-10-CM | POA: Insufficient documentation

## 2022-10-23 DIAGNOSIS — C50212 Malignant neoplasm of upper-inner quadrant of left female breast: Secondary | ICD-10-CM | POA: Insufficient documentation

## 2022-10-23 DIAGNOSIS — Z170 Estrogen receptor positive status [ER+]: Secondary | ICD-10-CM | POA: Diagnosis not present

## 2022-10-23 NOTE — Progress Notes (Signed)
Radiation Oncology Follow up Note  Name: Brittney Johnson   Date:   10/23/2022 MRN:  [id number] DOB: 01/11/1956    This 67 y.o. female presents to the clinic today for 2-year follow-up status post whole breast radiation to her left breast for stage I ER/PR positive invasive mammary carcinoma with micropapillary features.Marland Kitchen  REFERRING PROVIDER: Tower, Wynelle Fanny, MD  HPI: Patient is a 67 year old female now out 2 years having completed whole breast radiation to her left breast for stage I ER/PR positive invasive mammary carcinoma seen today in routine follow-up she is doing well.  She specifically denies breast tenderness cough or bone pain..  She had mammograms back in October which I have reviewed were BI-RADS 2 benign.  She is currently on Femara tolerating that well without side effect.  COMPLICATIONS OF TREATMENT: none  FOLLOW UP COMPLIANCE: keeps appointments   PHYSICAL EXAM:  BP 124/71   Pulse 75   Temp 98.9 F (37.2 C) (Tympanic)   Resp 16   Wt 167 lb 14.4 oz (76.2 kg)   LMP 07/30/2003   BMI 28.60 kg/m  Lungs are clear to A&P cardiac examination essentially unremarkable with regular rate and rhythm. No dominant mass or nodularity is noted in either breast in 2 positions examined. Incision is well-healed. No axillary or supraclavicular adenopathy is appreciated. Cosmetic result is excellent.  Well-developed well-nourished patient in NAD. HEENT reveals PERLA, EOMI, discs not visualized.  Oral cavity is clear. No oral mucosal lesions are identified. Neck is clear without evidence of cervical or supraclavicular adenopathy. Lungs are clear to A&P. Cardiac examination is essentially unremarkable with regular rate and rhythm without murmur rub or thrill. Abdomen is benign with no organomegaly or masses noted. Motor sensory and DTR levels are equal and symmetric in the upper and lower extremities. Cranial nerves II through XII are grossly intact. Proprioception is intact. No peripheral  adenopathy or edema is identified. No motor or sensory levels are noted. Crude visual fields are within normal range.  RADIOLOGY RESULTS: Anagrams reviewed compatible with above-stated findings  PLAN: Present time she is now 2 years out with no evidence of disease.  On pleased with her overall progress.  Of asked to see her back in 1 year for follow-up and then will discontinue follow-up care.  Patient will continue on Femara without side effect.  Patient is to call with any concerns.  I would like to take this opportunity to thank you for allowing me to participate in the care of your patient.Noreene Filbert, MD

## 2022-12-05 ENCOUNTER — Other Ambulatory Visit: Payer: Self-pay

## 2022-12-05 DIAGNOSIS — C50212 Malignant neoplasm of upper-inner quadrant of left female breast: Secondary | ICD-10-CM

## 2022-12-05 MED ORDER — LETROZOLE 2.5 MG PO TABS: 2.5000 mg | ORAL_TABLET | Freq: Every day | ORAL | 1 refills | Status: DC

## 2022-12-10 ENCOUNTER — Encounter: Payer: Self-pay | Admitting: Internal Medicine

## 2022-12-10 DIAGNOSIS — C50212 Malignant neoplasm of upper-inner quadrant of left female breast: Secondary | ICD-10-CM

## 2022-12-10 MED ORDER — LETROZOLE 2.5 MG PO TABS: 2.5000 mg | ORAL_TABLET | Freq: Every day | ORAL | 1 refills | Status: DC

## 2022-12-11 ENCOUNTER — Other Ambulatory Visit: Payer: Self-pay | Admitting: Nurse Practitioner

## 2022-12-11 DIAGNOSIS — Z170 Estrogen receptor positive status [ER+]: Secondary | ICD-10-CM

## 2023-01-01 ENCOUNTER — Encounter: Payer: Self-pay | Admitting: Internal Medicine

## 2023-01-01 ENCOUNTER — Inpatient Hospital Stay: Payer: BC Managed Care – PPO | Attending: Internal Medicine

## 2023-01-01 ENCOUNTER — Inpatient Hospital Stay (HOSPITAL_BASED_OUTPATIENT_CLINIC_OR_DEPARTMENT_OTHER): Payer: BC Managed Care – PPO | Admitting: Internal Medicine

## 2023-01-01 VITALS — BP 129/70 | HR 72 | Temp 97.9°F | Ht 64.25 in | Wt 159.0 lb

## 2023-01-01 DIAGNOSIS — Z801 Family history of malignant neoplasm of trachea, bronchus and lung: Secondary | ICD-10-CM | POA: Insufficient documentation

## 2023-01-01 DIAGNOSIS — Z87891 Personal history of nicotine dependence: Secondary | ICD-10-CM | POA: Insufficient documentation

## 2023-01-01 DIAGNOSIS — Z803 Family history of malignant neoplasm of breast: Secondary | ICD-10-CM | POA: Insufficient documentation

## 2023-01-01 DIAGNOSIS — C50212 Malignant neoplasm of upper-inner quadrant of left female breast: Secondary | ICD-10-CM

## 2023-01-01 DIAGNOSIS — Z170 Estrogen receptor positive status [ER+]: Secondary | ICD-10-CM

## 2023-01-01 DIAGNOSIS — M8580 Other specified disorders of bone density and structure, unspecified site: Secondary | ICD-10-CM | POA: Diagnosis not present

## 2023-01-01 DIAGNOSIS — Z79811 Long term (current) use of aromatase inhibitors: Secondary | ICD-10-CM | POA: Diagnosis not present

## 2023-01-01 DIAGNOSIS — Z90710 Acquired absence of both cervix and uterus: Secondary | ICD-10-CM | POA: Insufficient documentation

## 2023-01-01 LAB — CBC WITH DIFFERENTIAL (CANCER CENTER ONLY)
Abs Immature Granulocytes: 0.02 10*3/uL (ref 0.00–0.07)
Basophils Absolute: 0.1 10*3/uL (ref 0.0–0.1)
Basophils Relative: 1 %
Eosinophils Absolute: 0.1 10*3/uL (ref 0.0–0.5)
Eosinophils Relative: 1 %
HCT: 39 % (ref 36.0–46.0)
Hemoglobin: 12.7 g/dL (ref 12.0–15.0)
Immature Granulocytes: 0 %
Lymphocytes Relative: 37 %
Lymphs Abs: 1.7 10*3/uL (ref 0.7–4.0)
MCH: 28.7 pg (ref 26.0–34.0)
MCHC: 32.6 g/dL (ref 30.0–36.0)
MCV: 88 fL (ref 80.0–100.0)
Monocytes Absolute: 0.5 10*3/uL (ref 0.1–1.0)
Monocytes Relative: 12 %
Neutro Abs: 2.1 10*3/uL (ref 1.7–7.7)
Neutrophils Relative %: 49 %
Platelet Count: 205 10*3/uL (ref 150–400)
RBC: 4.43 MIL/uL (ref 3.87–5.11)
RDW: 13.4 % (ref 11.5–15.5)
WBC Count: 4.5 10*3/uL (ref 4.0–10.5)
nRBC: 0 % (ref 0.0–0.2)

## 2023-01-01 LAB — CMP (CANCER CENTER ONLY)
ALT: 20 U/L (ref 0–44)
AST: 18 U/L (ref 15–41)
Albumin: 4.4 g/dL (ref 3.5–5.0)
Alkaline Phosphatase: 64 U/L (ref 38–126)
Anion gap: 9 (ref 5–15)
BUN: 16 mg/dL (ref 8–23)
CO2: 26 mmol/L (ref 22–32)
Calcium: 9.6 mg/dL (ref 8.9–10.3)
Chloride: 102 mmol/L (ref 98–111)
Creatinine: 0.71 mg/dL (ref 0.44–1.00)
GFR, Estimated: >60 mL/min (ref >60)
Glucose, Bld: 92 mg/dL (ref 70–99)
Potassium: 4.6 mmol/L (ref 3.5–5.1)
Sodium: 137 mmol/L (ref 135–145)
Total Bilirubin: 0.4 mg/dL (ref 0.3–1.2)
Total Protein: 7.8 g/dL (ref 6.5–8.1)

## 2023-01-01 NOTE — Assessment & Plan Note (Addendum)
#  Left breast cancer stage I ER/PR positive HER-2 negative; s/p lumpectomy; on adjuvant letrozole until 2026-fall ] mammogram Oct 2023-Dr. Cintron. Stable.   # On letrozole tolerating  fairly well.  Again reviewed that she will continue until fall 2026.  # Osteopenia-reviewed OCT 2022- T score+ -1.4-osteopenia; DEC 2023-  T-score of -1.5. Given the stability of the bone density recommend continued monitoring. Patient physically active/exercising.  continue  MVT- Ca 1200 mg+ 1000+  vit D 2000 mg/day.   MARCH 2024- vit D 61.  Will repeat BMD in 2025.   # Weight gain- s/p keto diet/ low carb diet- congratulated. Exercise continue.    # Slightly elevated [facility] 2024]Protein-June 2024- WNL- no further work up.   Clinical cytogeneticist # DISPOSITION: # Follow-up in 6 months-MD; cbc/cmp-  Dr. Leonard Schwartz.

## 2023-01-01 NOTE — Progress Notes (Signed)
[facility] CONSULT NOTE  Patient Care Team: Tower, Audrie Gallus, MD as PCP - General Carmina Miller, MD as Referring Physician (Radiation Oncology) Jim Like, RN as Oncology Nurse Navigator Carolan Shiver, MD as Consulting Physician (General Surgery) Earna Coder, MD as Consulting Physician (Internal Medicine)  CHIEF COMPLAINTS/PURPOSE OF CONSULTATION: Breast cancer  #  Oncology History Overview Note  # LEFT BREAST CA- INVASIVE MAMMARY CARCINOMA, NO SPECIAL TYPE, WITH MICROPAPILLARY  FEATURES; ER/PR Positive [90%]; Her 2 NEG; G-1; 0.8 x 0.7 x 1 cm.  Oncotype recurrence score= 0; 3% distant recurrence; no chemotherapy; adjuvant radiation;  s/p RT [nov 5th, 2021];   # NOV end 221- START Anastrazole[previous HRT*]; JUNE 16th, 2023- Discontinue anastrozole; switch over to exemestane given patient's MSK side effects  # # SURVIVORSHIP:   # GENETICS: No pathogenic variants; VUS*  DIAGNOSIS: Cure  STAGE: 1        ;  GOALS: Cure  CURRENT/MOST RECENT THERAPY :     Carcinoma of upper-inner quadrant of left breast in female, estrogen receptor positive (HCC)  03/01/2020 Initial Diagnosis   Carcinoma of upper-inner quadrant of left breast in female, estrogen receptor positive (HCC)    Genetic Testing   Negative genetic testing. No pathogenic variants identified on the [facility] Multi-Cancer Panel. VUS in AIP called c.790_792del and VUS in RET called c.1163T>C identified. The report date is 04/07/2020.  The Multi-Cancer Panel offered by [facility] includes sequencing and/or deletion duplication testing of the following 85 genes: AIP, ALK, APC, ATM, AXIN2,BAP1,  BARD1, BLM, BMPR1A, BRCA1, BRCA2, BRIP1, CASR, CDC73, CDH1, CDK4, CDKN1B, CDKN1C, CDKN2A (p14ARF), CDKN2A (p16INK4a), CEBPA, CHEK2, CTNNA1, DICER1, DIS3L2, EGFR (c.2369C>T, p.Thr790Met variant only), EPCAM (Deletion/duplication testing only), FH, FLCN, GATA2, GPC3, GREM1 (Promoter region deletion/duplication  testing only), HOXB13 (c.251G>A, p.Gly84Glu), HRAS, KIT, MAX, MEN1, MET, MITF (c.952G>A, p.Glu318Lys variant only), MLH1, MSH2, MSH3, MSH6, MUTYH, NBN, NF1, NF2, NTHL1, PALB2, PDGFRA, PHOX2B, PMS2, POLD1, POLE, POT1, PRKAR1A, PTCH1, PTEN, RAD50, RAD51C, RAD51D, RB1, RECQL4, RET, RNF43, RUNX1, SDHAF2, SDHA (sequence changes only), SDHB, SDHC, SDHD, SMAD4, SMARCA4, SMARCB1, SMARCE1, STK11, SUFU, TERC, TERT, TMEM127, TP53, TSC1, TSC2, VHL, WRN and WT1.     HISTORY OF PRESENTING ILLNESS: Patient ambulating-independently.   Alone.  Brittney Johnson 67 y.o.  female diagnosis of stage I ER/PR positive HER-2 negative breast cancer on adjuvant letrozole is here for follow-up/review results of the mammogram.  C/o intermittent pain in the left breast area, comes and go. Otherwise no swelling or lumps.   Admits intentional weight loss. No side effects from letrozole. Energy is good. Appetite is normal.  Patient notes that improvement of her joint pains.  Denies any nausea vomiting abdominal pain.  No fever no chills.  Review of Systems  Constitutional:  Negative for chills, diaphoresis, fever, malaise/fatigue and weight loss.  HENT:  Negative for nosebleeds and sore throat.   Eyes:  Negative for double vision.  Respiratory:  Negative for cough, hemoptysis, sputum production, shortness of breath and wheezing.   Cardiovascular:  Negative for chest pain, palpitations, orthopnea and leg swelling.  Gastrointestinal:  Negative for abdominal pain, blood in stool, constipation, diarrhea, heartburn, melena, nausea and vomiting.  Genitourinary:  Negative for dysuria, frequency and urgency.  Musculoskeletal:  Positive for joint pain and myalgias. Negative for back pain.  Skin: Negative.  Negative for itching and rash.  Neurological:  Negative for dizziness, tingling, focal weakness, weakness and headaches.  Endo/Heme/Allergies:  Does not bruise/bleed easily.  Psychiatric/Behavioral:  Negative for depression. The  patient is not nervous/anxious and does not have insomnia.      MEDICAL HISTORY:  Past Medical History:  Diagnosis Date   Atypical pneumonia 1998   not hosp   Breast cancer (HCC) 2021   left-sx, radation completed   DDD (degenerative disc disease), cervical    Family history of breast cancer    Family history of lung cancer    Hyperlipidemia    Hypertension    Low back pain    on and off   PONV (postoperative nausea and vomiting)    due to some sedations!   Syncope and collapse 07/16/2007    SURGICAL HISTORY: Past Surgical History:  Procedure Laterality Date   ABDOMINAL HYSTERECTOMY     supracervical- L ovary remains   BREAST BIOPSY Left 02/22/2020   Q clip [place] bx, [facility]   CARDIOVASCULAR STRESS TEST     08/28/06 normal    COLONOSCOPY  08/13/2016   Pyrtle   left hand surgery Left 2016   Dupuytrens   LUMBAR DISC SURGERY  08/2017   Wynetta Emery   OOPHORECTOMY Right    Right ovary removed    PARTIAL MASTECTOMY WITH NEEDLE LOCALIZATION AND AXILLARY SENTINEL LYMPH NODE BX Left 03/15/2020   Procedure: PARTIAL MASTECTOMY WITH RF TAG PLACEMENT AND AXILLARY SENTINEL LYMPH NODE BX;  Surgeon: Carolan Shiver, MD;  Location: [facility] ORS;  Service: General;  Laterality: Left;   POLYPECTOMY     TUBAL LIGATION     ? 1979    SOCIAL HISTORY: Social History   Socioeconomic History   Marital status: Married    Spouse name: Not on file   Number of children: Not on file   Years of education: Not on file   Highest education level: Not on file  Occupational History   Occupation: Production designer, theatre/television/film: [employer]  Tobacco Use   Smoking status: Former    Packs/day: 0.50    Years: 5.00    Additional pack years: 0.00    Total pack years: 2.50    Types: Cigarettes    Quit date: 07/29/1997    Years since quitting: 25.4   Smokeless tobacco: Never  Vaping Use   Vaping Use: Never used  Substance and Sexual Activity   Alcohol use: Yes    Alcohol/week: 1.0 standard drink  of alcohol    Types: 1 Glasses of wine per week   Drug use: No   Sexual activity: Yes  Other Topics Concern   Not on file  Social History Narrative   Kids live close by, 3 grandkids- really enjoys, runs for exercise, ran a 5k March 2011; walks; quit 20 years; rare alcohol. Work for [employer]; lives in [place]. 2 boys- 40s.    Social Determinants of Health   Financial Resource Strain: Not on file  Food Insecurity: Not on file  Transportation Needs: Not on file  Physical Activity: Not on file  Stress: Not on file  Social Connections: Not on file  Intimate Partner Violence: Not on file    FAMILY HISTORY: Family History  Problem Relation Age of Onset   Lung cancer Father 9   Hyperlipidemia Mother    Diabetes Mother    Hypertension Mother    Stroke Mother    Heart disease Other    Lung cancer Other        uncle   Diabetes Other        aunt   Coronary artery disease Maternal Grandfather    Breast cancer Maternal  Aunt        dx under 50   Lung cancer Maternal Uncle    Lung cancer Paternal Uncle    Cancer Cousin        unk types   Cancer Cousin        unk types   Thyroid disease Neg Hx    Colon cancer Neg Hx    Colon polyps Neg Hx    Esophageal cancer Neg Hx    Rectal cancer Neg Hx    Stomach cancer Neg Hx     ALLERGIES:  is allergic to codeine, bee venom, and atorvastatin.  MEDICATIONS:  Current Outpatient Medications  Medication Sig Dispense Refill   amLODipine (NORVASC) 5 MG tablet Take 1 tablet (5 mg total) by mouth daily. 90 tablet 3   aspirin 81 MG EC tablet Take 81 mg by mouth every Monday, Wednesday, and Friday.      Calcium Citrate-Vitamin D (CALCIUM + D PO) Take by mouth.     ergocalciferol (VITAMIN D2) 1.25 MG (50000 UT) capsule Take 1 capsule (50,000 Units total) by mouth once a week. 4 capsule 3   letrozole (FEMARA) 2.5 MG tablet TAKE 1 TABLET BY MOUTH EVERY DAY 30 tablet 1   Lifitegrast (XIIDRA) 5 % SOLN Place 1 drop into both eyes at bedtime.       lisinopril (ZESTRIL) 10 MG tablet Take 1 tablet (10 mg total) by mouth daily. 90 tablet 3   Omega-3 Fatty Acids (FISH OIL) 1200 MG CAPS Take 1,200 mg by mouth 2 (two) times daily.     rosuvastatin (CRESTOR) 5 MG tablet TAKE 1 TABLET (5 MG TOTAL) BY MOUTH DAILY. 90 tablet 1   No current facility-administered medications for this visit.      Marland Kitchen  PHYSICAL EXAMINATION: ECOG PERFORMANCE STATUS: 0 - Asymptomatic  Vitals:   01/01/23 1029  BP: 129/70  Pulse: 72  Temp: 97.9 F (36.6 C)  SpO2: 100%   Filed Weights   01/01/23 1029  Weight: 159 lb (72.1 kg)    Physical Exam HENT:     Head: Normocephalic and atraumatic.     Mouth/Throat:     Pharynx: No oropharyngeal exudate.  Eyes:     Pupils: Pupils are equal, round, and reactive to light.  Cardiovascular:     Rate and Rhythm: Normal rate and regular rhythm.  Pulmonary:     Effort: Pulmonary effort is normal. No respiratory distress.     Breath sounds: Normal breath sounds. No wheezing.  Abdominal:     General: Bowel sounds are normal. There is no distension.     Palpations: Abdomen is soft. There is no mass.     Tenderness: There is no abdominal tenderness. There is no guarding or rebound.  Musculoskeletal:        General: No tenderness. Normal range of motion.     Cervical back: Normal range of motion and neck supple.  Skin:    General: Skin is warm.  Neurological:     Mental Status: She is alert and oriented to person, place, and time.  Psychiatric:        Mood and Affect: Affect normal.    LABORATORY DATA:  I have reviewed the data as listed Lab Results  Component Value Date   WBC 4.5 01/01/2023   HGB 12.7 01/01/2023   HCT 39.0 01/01/2023   MCV 88.0 01/01/2023   PLT 205 01/01/2023   Recent Labs    07/19/22 1032 10/02/22 1315 01/01/23 1031  NA 137 135 137  K 4.1 4.2 4.6  CL 103 100 102  CO2 24 25 26   GLUCOSE 107* 99 92  BUN 15 16 16   CREATININE 0.77 0.71 0.71  CALCIUM 9.1 10.1 9.6  GFRNONAA >60  >60 >60  PROT 7.5 8.2* 7.8  ALBUMIN 4.2 4.6 4.4  AST 23 20 18   ALT 22 20 20   ALKPHOS 53 61 64  BILITOT 0.4 0.4 0.4    RADIOGRAPHIC STUDIES: I have personally reviewed the radiological images as listed and agreed with the findings in the report. No results found.  ASSESSMENT & PLAN:   Carcinoma of upper-inner quadrant of left breast in female, estrogen receptor positive (HCC) #Left breast cancer stage I ER/PR positive HER-2 negative; s/p lumpectomy; on adjuvant letrozole until 2026-fall ] mammogram Oct 2023-Dr. Cintron. Stable.   # On letrozole tolerating  fairly well.  Again reviewed that she will continue until fall 2026.  # Osteopenia-reviewed OCT 2022- T score+ -1.4-osteopenia; DEC 2023-  T-score of -1.5. Given the stability of the bone density recommend continued monitoring. Patient physically active/exercising.  continue  MVT- Ca 1200 mg+ 1000+  vit D 2000 mg/day.   MARCH 2024- vit D 61.  Will repeat BMD in 2025.   # Weight gain- s/p keto diet/ low carb diet- congratulated. Exercise continue.    # Slightly elevated [MARCH 2024]Protein-June 2024- WNL- no further work up.   Clinical cytogeneticist # DISPOSITION: # Follow-up in 6 months-MD; cbc/cmp-  Dr. Fabio Neighbors, MD 01/01/2023 11:10 AM

## 2023-01-01 NOTE — Progress Notes (Signed)
C/o a pain in the left breast area, comes and goes.

## 2023-02-04 ENCOUNTER — Encounter: Payer: Self-pay | Admitting: Nurse Practitioner

## 2023-02-13 ENCOUNTER — Encounter: Payer: Self-pay | Admitting: Internal Medicine

## 2023-02-13 ENCOUNTER — Ambulatory Visit: Payer: BC Managed Care – PPO | Admitting: Internal Medicine

## 2023-02-13 VITALS — BP 116/70 | HR 70 | Temp 97.9°F | Ht 64.25 in | Wt 158.0 lb

## 2023-02-13 DIAGNOSIS — M25562 Pain in left knee: Secondary | ICD-10-CM | POA: Diagnosis not present

## 2023-02-13 NOTE — Assessment & Plan Note (Signed)
Findings suggest mild tear of medial meniscus Nothing to suggest sig OA Variable symptoms  Discussed trying a brace when walking a lot Try regular diclofenac gel If ongoing pain, she will set up with [facility]

## 2023-02-13 NOTE — Progress Notes (Signed)
Subjective:    Patient ID: Brittney Johnson, female    DOB: 1956-03-04, 67 y.o.   MRN: [id number]  HPI Here due to left knee pain  Had bilateral leg pain Taken off statin and taken off anastrozole Needed surgery for Duputryen's contractures Had been better on letrozole Also on vitamin D deficiency  Now having pain in left knee only--for the past 3 weeks Not likely from medicine since localized Got even worse when on vacation--walking a lot then Still catches and pops--then can be sharp pain No swelling Aches when on it for a while Some pain with flexion  Tries alternating tylenol/ibuprofen Voltaren cream seems to help  Current Outpatient Medications on File Prior to Visit  Medication Sig Dispense Refill   amLODipine (NORVASC) 5 MG tablet Take 1 tablet (5 mg total) by mouth daily. 90 tablet 3   aspirin 81 MG EC tablet Take 81 mg by mouth every Monday, Wednesday, and Friday.      Calcium Citrate-Vitamin D (CALCIUM + D PO) Take by mouth.     cholecalciferol (VITAMIN D3) 25 MCG (1000 UNIT) tablet Take 2,000 Units by mouth daily.     cyanocobalamin (VITAMIN B12) 1000 MCG tablet Take 1,000 mcg by mouth daily.     letrozole (FEMARA) 2.5 MG tablet TAKE 1 TABLET BY MOUTH EVERY DAY 30 tablet 1   Lifitegrast (XIIDRA) 5 % SOLN Place 1 drop into both eyes at bedtime.      lisinopril (ZESTRIL) 10 MG tablet Take 1 tablet (10 mg total) by mouth daily. 90 tablet 3   Omega-3 Fatty Acids (FISH OIL) 1200 MG CAPS Take 1,200 mg by mouth 2 (two) times daily.     rosuvastatin (CRESTOR) 5 MG tablet TAKE 1 TABLET (5 MG TOTAL) BY MOUTH DAILY. 90 tablet 1   No current facility-administered medications on file prior to visit.    Allergies  Allergen Reactions   Codeine     REACTION: passed out   Bee Venom Swelling   Atorvastatin     Leg pain     Past Medical History:  Diagnosis Date   Atypical pneumonia 1998   not hosp   Breast cancer (HCC) 2021   left-sx, radation completed   DDD  (degenerative disc disease), cervical    Family history of breast cancer    Family history of lung cancer    Hyperlipidemia    Hypertension    Low back pain    on and off   PONV (postoperative nausea and vomiting)    due to some sedations!   Syncope and collapse 07/16/2007    Past Surgical History:  Procedure Laterality Date   ABDOMINAL HYSTERECTOMY     supracervical- L ovary remains   BREAST BIOPSY Left 02/22/2020   Q clip [place] bx, [facility]   CARDIOVASCULAR STRESS TEST     08/28/06 normal    COLONOSCOPY  08/13/2016   Pyrtle   left hand surgery Left 2016   Dupuytrens   LUMBAR DISC SURGERY  08/2017   Wynetta Emery   OOPHORECTOMY Right    Right ovary removed    PARTIAL MASTECTOMY WITH NEEDLE LOCALIZATION AND AXILLARY SENTINEL LYMPH NODE BX Left 03/15/2020   Procedure: PARTIAL MASTECTOMY WITH RF TAG PLACEMENT AND AXILLARY SENTINEL LYMPH NODE BX;  Surgeon: Carolan Shiver, MD;  Location: [facility] ORS;  Service: General;  Laterality: Left;   POLYPECTOMY     TUBAL LIGATION     ? 1979    Family History  Problem Relation Age  of Onset   Lung cancer Father 61   Hyperlipidemia Mother    Diabetes Mother    Hypertension Mother    Stroke Mother    Heart disease Other    Lung cancer Other        uncle   Diabetes Other        aunt   Coronary artery disease Maternal Grandfather    Breast cancer Maternal Aunt        dx under 50   Lung cancer Maternal Uncle    Lung cancer Paternal Uncle    Cancer Cousin        unk types   Cancer Cousin        unk types   Thyroid disease Neg Hx    Colon cancer Neg Hx    Colon polyps Neg Hx    Esophageal cancer Neg Hx    Rectal cancer Neg Hx    Stomach cancer Neg Hx     Social History   Socioeconomic History   Marital status: Married    Spouse name: Not on file   Number of children: Not on file   Years of education: Not on file   Highest education level: Not on file  Occupational History   Occupation: Production designer, theatre/television/film: [employer]  Tobacco Use   Smoking status: Former    Current packs/day: 0.00    Average packs/day: 0.5 packs/day for 5.0 years (2.5 ttl pk-yrs)    Types: Cigarettes    Start date: 07/29/1992    Quit date: 07/29/1997    Years since quitting: 25.5   Smokeless tobacco: Never  Vaping Use   Vaping status: Never Used  Substance and Sexual Activity   Alcohol use: Yes    Alcohol/week: 1.0 standard drink of alcohol    Types: 1 Glasses of wine per week   Drug use: No   Sexual activity: Yes  Other Topics Concern   Not on file  Social History Narrative   Kids live close by, 3 grandkids- really enjoys, runs for exercise, ran a 5k March 2011; walks; quit 20 years; rare alcohol. Work for [employer]; lives in [place]. 2 boys- 40s.    Social Determinants of Health   Financial Resource Strain: Not on file  Food Insecurity: Not on file  Transportation Needs: Not on file  Physical Activity: Not on file  Stress: Not on file  Social Connections: Not on file  Intimate Partner Violence: Not on file   Review of Systems No foot/ankle swelling    Objective:   Physical Exam Constitutional:      Appearance: Normal appearance.  Musculoskeletal:     Comments: Left knee--no swelling No ligament findings ROM is good---can flex fully Meniscus testing positive for medial pain (feels it posterior)   Neurological:     Mental Status: She is alert.     Comments: Normal gait and leg strength            Assessment & Plan:

## 2023-02-17 ENCOUNTER — Ambulatory Visit: Payer: BC Managed Care – PPO | Admitting: Family Medicine

## 2023-03-03 DIAGNOSIS — M25562 Pain in left knee: Secondary | ICD-10-CM | POA: Diagnosis not present

## 2023-03-13 ENCOUNTER — Encounter (INDEPENDENT_AMBULATORY_CARE_PROVIDER_SITE_OTHER): Payer: Self-pay

## 2023-03-16 DIAGNOSIS — M25562 Pain in left knee: Secondary | ICD-10-CM | POA: Diagnosis not present

## 2023-03-21 ENCOUNTER — Other Ambulatory Visit: Payer: Self-pay | Admitting: General Surgery

## 2023-03-21 DIAGNOSIS — Z1231 Encounter for screening mammogram for malignant neoplasm of breast: Secondary | ICD-10-CM

## 2023-03-24 DIAGNOSIS — M25562 Pain in left knee: Secondary | ICD-10-CM | POA: Diagnosis not present

## 2023-04-20 ENCOUNTER — Telehealth: Payer: Self-pay | Admitting: Family Medicine

## 2023-04-20 DIAGNOSIS — E7800 Pure hypercholesterolemia, unspecified: Secondary | ICD-10-CM

## 2023-04-20 DIAGNOSIS — E559 Vitamin D deficiency, unspecified: Secondary | ICD-10-CM

## 2023-04-20 DIAGNOSIS — I10 Essential (primary) hypertension: Secondary | ICD-10-CM

## 2023-04-20 NOTE — Telephone Encounter (Signed)
-----   Message from Lovena Neighbours sent at 04/03/2023  2:05 PM EDT ----- Regarding: Labs for Monday 9.23.24 Please put physical lab orders in future. Thank you, Denny Peon

## 2023-04-21 ENCOUNTER — Other Ambulatory Visit (INDEPENDENT_AMBULATORY_CARE_PROVIDER_SITE_OTHER): Payer: BC Managed Care – PPO

## 2023-04-21 DIAGNOSIS — I10 Essential (primary) hypertension: Secondary | ICD-10-CM | POA: Diagnosis not present

## 2023-04-21 DIAGNOSIS — E7800 Pure hypercholesterolemia, unspecified: Secondary | ICD-10-CM

## 2023-04-21 DIAGNOSIS — E559 Vitamin D deficiency, unspecified: Secondary | ICD-10-CM

## 2023-04-21 LAB — CBC WITH DIFFERENTIAL/PLATELET
Basophils Absolute: 0 10*3/uL (ref 0.0–0.1)
Basophils Relative: 0.6 % (ref 0.0–3.0)
Eosinophils Absolute: 0.1 10*3/uL (ref 0.0–0.7)
Eosinophils Relative: 1.7 % (ref 0.0–5.0)
HCT: 38.1 % (ref 36.0–46.0)
Hemoglobin: 12.5 g/dL (ref 12.0–15.0)
Lymphocytes Relative: 39.6 % (ref 12.0–46.0)
Lymphs Abs: 1.4 10*3/uL (ref 0.7–4.0)
MCHC: 32.8 g/dL (ref 30.0–36.0)
MCV: 88 fl (ref 78.0–100.0)
Monocytes Absolute: 0.4 10*3/uL (ref 0.1–1.0)
Monocytes Relative: 11.1 % (ref 3.0–12.0)
Neutro Abs: 1.6 10*3/uL (ref 1.4–7.7)
Neutrophils Relative %: 47 % (ref 43.0–77.0)
Platelets: 227 10*3/uL (ref 150.0–400.0)
RBC: 4.33 Mil/uL (ref 3.87–5.11)
RDW: 14.4 % (ref 11.5–15.5)
WBC: 3.5 10*3/uL — ABNORMAL LOW (ref 4.0–10.5)

## 2023-04-21 LAB — COMPREHENSIVE METABOLIC PANEL
ALT: 13 U/L (ref 0–35)
AST: 15 U/L (ref 0–37)
Albumin: 4.4 g/dL (ref 3.5–5.2)
Alkaline Phosphatase: 54 U/L (ref 39–117)
BUN: 15 mg/dL (ref 6–23)
CO2: 27 mEq/L (ref 19–32)
Calcium: 9.7 mg/dL (ref 8.4–10.5)
Chloride: 103 mEq/L (ref 96–112)
Creatinine, Ser: 0.73 mg/dL (ref 0.40–1.20)
GFR: 83.86 mL/min (ref >60.00)
Glucose, Bld: 96 mg/dL (ref 70–99)
Potassium: 4.2 mEq/L (ref 3.5–5.1)
Sodium: 139 mEq/L (ref 135–145)
Total Bilirubin: 0.5 mg/dL (ref 0.2–1.2)
Total Protein: 6.9 g/dL (ref 6.0–8.3)

## 2023-04-21 LAB — LIPID PANEL
Cholesterol: 193 mg/dL (ref 0–200)
HDL: 65.7 mg/dL (ref >39.00)
LDL Cholesterol: 103 mg/dL — ABNORMAL HIGH (ref 0–99)
NonHDL: 127.09
Total CHOL/HDL Ratio: 3
Triglycerides: 121 mg/dL (ref 0.0–149.0)
VLDL: 24.2 mg/dL (ref 0.0–40.0)

## 2023-04-21 LAB — TSH: TSH: 1.95 u[IU]/mL (ref 0.35–5.50)

## 2023-04-21 LAB — VITAMIN D 25 HYDROXY (VIT D DEFICIENCY, FRACTURES): VITD: 43.37 ng/mL (ref 30.00–100.00)

## 2023-04-28 ENCOUNTER — Ambulatory Visit (INDEPENDENT_AMBULATORY_CARE_PROVIDER_SITE_OTHER): Payer: BC Managed Care – PPO | Admitting: Family Medicine

## 2023-04-28 ENCOUNTER — Encounter: Payer: Self-pay | Admitting: Family Medicine

## 2023-04-28 VITALS — BP 126/68 | HR 71 | Temp 97.8°F | Ht 64.0 in | Wt 149.1 lb

## 2023-04-28 DIAGNOSIS — E7800 Pure hypercholesterolemia, unspecified: Secondary | ICD-10-CM

## 2023-04-28 DIAGNOSIS — M85859 Other specified disorders of bone density and structure, unspecified thigh: Secondary | ICD-10-CM

## 2023-04-28 DIAGNOSIS — Z01818 Encounter for other preprocedural examination: Secondary | ICD-10-CM | POA: Insufficient documentation

## 2023-04-28 DIAGNOSIS — Z23 Encounter for immunization: Secondary | ICD-10-CM

## 2023-04-28 DIAGNOSIS — E559 Vitamin D deficiency, unspecified: Secondary | ICD-10-CM

## 2023-04-28 DIAGNOSIS — R9431 Abnormal electrocardiogram [ECG] [EKG]: Secondary | ICD-10-CM

## 2023-04-28 DIAGNOSIS — Z01810 Encounter for preprocedural cardiovascular examination: Secondary | ICD-10-CM

## 2023-04-28 DIAGNOSIS — Z0000 Encounter for general adult medical examination without abnormal findings: Secondary | ICD-10-CM | POA: Diagnosis not present

## 2023-04-28 DIAGNOSIS — I10 Essential (primary) hypertension: Secondary | ICD-10-CM

## 2023-04-28 NOTE — Assessment & Plan Note (Signed)
Dexa 06/2022  No falls or fracture  D level is in treatment range   Discussed fall prevention, supplements and exercise for bone density

## 2023-04-28 NOTE — Assessment & Plan Note (Signed)
Disc goals for lipids and reasons to control them Rev last labs with pt Rev low sat fat diet in detail Taking atorvastatin 20 mg daily  Good diet  LDL si 103 with good HDL in 60s

## 2023-04-28 NOTE — Assessment & Plan Note (Signed)
Reviewed health habits including diet and exercise and skin cancer prevention Reviewed appropriate screening tests for age  Also reviewed health mt list, fam hx and immunization status , as well as social and family history   See HPI Labs reviewed and ordered Flu shot given  Considering shingrix at pharmacy /had zostavax in past  Commended weight loss  Mammogram scheduled next month/personal history of breast cancer  Colonoscpy 08/2020 with 3 y recall  Dexa utd Discussed fall prevention, supplements and exercise for bone density   PHQ 1

## 2023-04-28 NOTE — Assessment & Plan Note (Signed)
bp in fair control at this time  BP Readings from Last 1 Encounters:  04/28/23 126/68   No changes needed Most recent labs reviewed  Disc lifstyle change with low sodium diet and exercise  Taking amlodipine 5 mg daily  Lisinopril 10 mg daily

## 2023-04-28 NOTE — Assessment & Plan Note (Signed)
Baseline today with ST depression  No change from previous  Reviewed last cardiology note and normal stress echo 2016

## 2023-04-28 NOTE — Progress Notes (Signed)
Subjective:    Patient ID: Brittney Johnson, female    DOB: 10/17/1955, 67 y.o.   MRN: [id number]  HPI  Here for health maintenance exam and to review chronic medical problems  Also needs surgical clearance for filled out    Wt Readings from Last 3 Encounters:  04/28/23 149 lb 2 oz (67.6 kg)  02/13/23 158 lb (71.7 kg)  01/01/23 159 lb (72.1 kg)   25.60 kg/m  Vitals:   04/28/23 0802  BP: 126/68  Pulse: 71  Temp: 97.8 F (36.6 C)  SpO2: 96%    Immunization History  Administered Date(s) Administered   Fluad Quad(high Dose 65+) 04/19/2019, 05/24/2020, 04/23/2021, 04/25/2022   Fluad Trivalent(High Dose 65+) 04/28/2023   Influenza Split 05/30/2011, 04/28/2012   Influenza Whole 04/19/2010   Influenza,inj,Quad PF,6+ Mos 04/20/2013, 05/11/2014, 04/20/2015, 04/09/2016, 04/11/2017, 04/13/2018   PFIZER(Purple Top)SARS-COV-2 Vaccination 10/04/2019, 10/27/2019, 07/10/2020   Pneumococcal Conjugate-13 04/19/2019   Pneumococcal Polysaccharide-23 07/29/2005, 04/19/2020   Tdap 12/05/2010, 01/06/2020   Zoster, Live 05/23/2015    Health Maintenance Due  Topic Date Due   Hepatitis C Screening  Never done   Zoster Vaccines- Shingrix (1 of 2) 10/20/1972   Flu shot - today  Shingrix- will consider/ had the old one   Working on weight loss  Keto diet  Had gained weight - got up to 164 and lost to 149 lb   No regular exercise   Mammogram scheduled 10/7 Personal history of breast cancer Taking letrozole until fall 2026  Self breast exam  Gyn health Supracervical hysterectomy in the past  No issues   Colon cancer screening -colonoscopy 08/2020 -3 y recall    Bone health  Dexa 06/2022  osteopenia  Falls- none  Fractures-none  Supplements  Last vitamin D Lab Results  Component Value Date   VD25OH 43.37 04/21/2023    Exercise -none now (knee)     Mood    04/28/2023    8:18 AM 04/25/2022    8:42 AM 04/23/2021   12:07 PM 04/19/2020    9:15 AM 04/19/2019    8:33 AM   Depression screen PHQ 2/9  Decreased Interest 0 0 0 0 0  Down, Depressed, Hopeless 0 0 0 0 0  PHQ - 2 Score 0 0 0 0 0  Altered sleeping 0 0 0 0   Tired, decreased energy 0 1 0 0   Change in appetite 0 0 0 1   Feeling bad or failure about yourself  0 0 0 0   Trouble concentrating 0 0 0 0   Moving slowly or fidgety/restless 0 0 0 0   Suicidal thoughts 0 0 0 0   PHQ-9 Score 0 1 0 1   Difficult doing work/chores Not difficult at all Not difficult at all Not difficult at all Not difficult at all    Getting ready for a right knee arthroscopy with Dr Shelle Iron  Has not scheduled yet  Torn meniscus    Saw cardiology in 2016 for pre syncope  Stress echo was normal  Decided symptoms were likely from anxiety  Has baseline EKG with global ST depression   HTN bp is stable today  No cp or palpitations or headaches or edema  No side effects to medicines  BP Readings from Last 3 Encounters:  04/28/23 126/68  02/13/23 116/70  01/01/23 129/70    Amlodipine 5 mg daily  Lisinopril 10 mg daily   Takes asa every other day -now takes two days per week  Pulse Readings from Last 3 Encounters:  04/28/23 71  02/13/23 70  01/01/23 72      Lab Results  Component Value Date   NA 139 04/21/2023   K 4.2 04/21/2023   CO2 27 04/21/2023   GLUCOSE 96 04/21/2023   BUN 15 04/21/2023   CREATININE 0.73 04/21/2023   CALCIUM 9.7 04/21/2023   GFR 83.86 04/21/2023   GFRNONAA >60 01/01/2023    Hyperlipidemia Lab Results  Component Value Date   CHOL 193 04/21/2023   CHOL 195 07/15/2022   CHOL 179 04/18/2022   Lab Results  Component Value Date   HDL 65.70 04/21/2023   HDL 62.70 07/15/2022   HDL 52.40 04/18/2022   Lab Results  Component Value Date   LDLCALC 103 (H) 04/21/2023   LDLCALC 100 (H) 07/15/2022   LDLCALC 97 04/18/2022   Lab Results  Component Value Date   TRIG 121.0 04/21/2023   TRIG 163.0 (H) 07/15/2022   TRIG 148.0 04/18/2022   Lab Results  Component Value Date    CHOLHDL 3 04/21/2023   CHOLHDL 3 07/15/2022   CHOLHDL 3 04/18/2022   Lab Results  Component Value Date   LDLDIRECT 160.9 03/02/2013   LDLDIRECT 152.1 02/03/2012   LDLDIRECT 128.7 05/31/2011   Takes crestor 5 mg daily  Eating better  Modified keto   Chicken Vegetables      Lab Results  Component Value Date   WBC 3.5 (L) 04/21/2023   HGB 12.5 04/21/2023   HCT 38.1 04/21/2023   MCV 88.0 04/21/2023   PLT 227.0 04/21/2023   Lab Results  Component Value Date   TSH 1.95 04/21/2023   Lab Results  Component Value Date   VITAMINB12 744 10/02/2022  Taking B12 per oncologist  Lab Results  Component Value Date   ALT 13 04/21/2023   AST 15 04/21/2023   ALKPHOS 54 04/21/2023   BILITOT 0.5 04/21/2023      Patient Active Problem List   Diagnosis Date Noted   Pre-operative clearance 04/28/2023   Left knee pain 02/13/2023   Leg pain 04/25/2022   Genetic testing 04/10/2020   Family history of breast cancer    Family history of lung cancer    Carcinoma of upper-inner quadrant of left breast in female, estrogen receptor positive (HCC) 03/01/2020   Osteopenia 05/15/2019   Estrogen deficiency 04/19/2019   Nocturnal leg cramps 04/13/2018   Colon cancer screening 04/09/2016   Vitamin D deficiency 04/09/2016   Enlarged thyroid 12/05/2015   Contracture of palmar fascia 07/10/2015   Encounter for routine gynecological examination 02/20/2015   Near syncope 08/05/2014   Abnormal EKG 08/05/2014   Anxiety 08/05/2014   H/O cold sores 06/06/2014   Dupuytren's contracture of right hand 02/08/2014   Stress reaction 03/09/2013   Routine general medical examination at a health care facility 12/05/2010   Raynaud disease 12/05/2010   Hyperlipidemia 06/29/2010   HYPERTENSION, BENIGN ESSENTIAL 06/29/2010   Lumbar back pain with radiculopathy affecting right lower extremity 06/29/2010   Past Medical History:  Diagnosis Date   Atypical pneumonia 1998   not hosp   Breast cancer (HCC)  2021   left-sx, radation completed   DDD (degenerative disc disease), cervical    Family history of breast cancer    Family history of lung cancer    Hyperlipidemia    Hypertension    Low back pain    on and off   PONV (postoperative nausea and vomiting)    due to some sedations!  Syncope and collapse 07/16/2007   Past Surgical History:  Procedure Laterality Date   ABDOMINAL HYSTERECTOMY     supracervical- L ovary remains   BREAST BIOPSY Left 02/22/2020   Q clip [place] bx, [facility]   CARDIOVASCULAR STRESS TEST     08/28/06 normal    COLONOSCOPY  08/13/2016   [facility]   left hand surgery Left 2016   Dupuytrens   LUMBAR DISC SURGERY  08/2017   Wynetta Emery   OOPHORECTOMY Right    Right ovary removed    PARTIAL MASTECTOMY WITH NEEDLE LOCALIZATION AND AXILLARY SENTINEL LYMPH NODE BX Left 03/15/2020   Procedure: PARTIAL MASTECTOMY WITH RF TAG PLACEMENT AND AXILLARY SENTINEL LYMPH NODE BX;  Surgeon: Carolan Shiver, MD;  Location: [facility] ORS;  Service: General;  Laterality: Left;   POLYPECTOMY     TUBAL LIGATION     ? 1979   Social History   Tobacco Use   Smoking status: Former    Current packs/day: 0.00    Average packs/day: 0.5 packs/day for 5.0 years (2.5 ttl pk-yrs)    Types: Cigarettes    Start date: 07/29/1992    Quit date: 07/29/1997    Years since quitting: 25.7   Smokeless tobacco: Never  Vaping Use   Vaping status: Never Used  Substance Use Topics   Alcohol use: Yes    Alcohol/week: 1.0 standard drink of alcohol    Types: 1 Glasses of wine per week   Drug use: No   Family History  Problem Relation Age of Onset   Lung cancer Father 55   Hyperlipidemia Mother    Diabetes Mother    Hypertension Mother    Stroke Mother    Heart disease Other    Lung cancer Other        uncle   Diabetes Other        aunt   Coronary artery disease Maternal Grandfather    Breast cancer Maternal Aunt        dx under 50   Lung cancer Maternal Uncle    Lung cancer Paternal Uncle    Cancer  Cousin        unk types   Cancer Cousin        unk types   Thyroid disease Neg Hx    Colon cancer Neg Hx    Colon polyps Neg Hx    Esophageal cancer Neg Hx    Rectal cancer Neg Hx    Stomach cancer Neg Hx    Allergies  Allergen Reactions   Codeine     REACTION: passed out   Bee Venom Swelling   Atorvastatin     Leg pain    Current Outpatient Medications on File Prior to Visit  Medication Sig Dispense Refill   amLODipine (NORVASC) 5 MG tablet Take 1 tablet (5 mg total) by mouth daily. 90 tablet 3   aspirin 81 MG EC tablet Take 81 mg by mouth every Monday, Wednesday, and Friday.      Calcium Citrate-Vitamin D (CALCIUM + D PO) Take by mouth.     cholecalciferol (VITAMIN D3) 25 MCG (1000 UNIT) tablet Take 2,000 Units by mouth daily.     cyanocobalamin (VITAMIN B12) 1000 MCG tablet Take 1,000 mcg by mouth daily.     letrozole (FEMARA) 2.5 MG tablet TAKE 1 TABLET BY MOUTH EVERY DAY 30 tablet 1   Lifitegrast (XIIDRA) 5 % SOLN Place 1 drop into both eyes at bedtime.      lisinopril (ZESTRIL) 10 MG  tablet Take 1 tablet (10 mg total) by mouth daily. 90 tablet 3   Omega-3 Fatty Acids (FISH OIL) 1200 MG CAPS Take 1,200 mg by mouth 2 (two) times daily.     rosuvastatin (CRESTOR) 5 MG tablet TAKE 1 TABLET (5 MG TOTAL) BY MOUTH DAILY. 90 tablet 1   No current facility-administered medications on file prior to visit.    Review of Systems  Constitutional:  Negative for activity change, appetite change, fatigue, fever and unexpected weight change.  HENT:  Negative for congestion, ear pain, rhinorrhea, sinus pressure and sore throat.   Eyes:  Negative for pain, redness and visual disturbance.  Respiratory:  Negative for cough, shortness of breath and wheezing.   Cardiovascular:  Negative for chest pain and palpitations.  Gastrointestinal:  Negative for abdominal pain, blood in stool, constipation and diarrhea.  Endocrine: Negative for polydipsia and polyuria.  Genitourinary:  Negative for  dysuria, frequency and urgency.  Musculoskeletal:  Positive for arthralgias. Negative for back pain and myalgias.       Knee pain   Skin:  Negative for pallor and rash.  Allergic/Immunologic: Negative for environmental allergies.  Neurological:  Negative for dizziness, syncope and headaches.  Hematological:  Negative for adenopathy. Does not bruise/bleed easily.  Psychiatric/Behavioral:  Negative for decreased concentration and dysphoric mood. The patient is not nervous/anxious.        Objective:   Physical Exam Constitutional:      General: She is not in acute distress.    Appearance: Normal appearance. She is well-developed and normal weight. She is not ill-appearing or diaphoretic.  HENT:     Head: Normocephalic and atraumatic.     Right Ear: Tympanic membrane, ear canal and external ear normal.     Left Ear: Tympanic membrane, ear canal and external ear normal.     Nose: Nose normal. No congestion.     Mouth/Throat:     Mouth: Mucous membranes are moist.     Pharynx: Oropharynx is clear. No posterior oropharyngeal erythema.  Eyes:     General: No scleral icterus.    Extraocular Movements: Extraocular movements intact.     Conjunctiva/sclera: Conjunctivae normal.     Pupils: Pupils are equal, round, and reactive to light.  Neck:     Thyroid: No thyromegaly.     Vascular: No carotid bruit or JVD.  Cardiovascular:     Rate and Rhythm: Normal rate and regular rhythm.     Pulses: Normal pulses.     Heart sounds: Normal heart sounds.     No gallop.  Pulmonary:     Effort: Pulmonary effort is normal. No respiratory distress.     Breath sounds: Normal breath sounds. No wheezing.     Comments: Good air exch Chest:     Chest wall: No tenderness.  Abdominal:     General: Bowel sounds are normal. There is no distension or abdominal bruit.     Palpations: Abdomen is soft. There is no mass.     Tenderness: There is no abdominal tenderness.     Hernia: No hernia is present.   Genitourinary:    Comments: Breast exam done by oncology    Musculoskeletal:        General: No tenderness. Normal range of motion.     Cervical back: Normal range of motion and neck supple. No rigidity. No muscular tenderness.     Right lower leg: No edema.     Left lower leg: No edema.  Comments: No kyphosis   Lymphadenopathy:     Cervical: No cervical adenopathy.  Skin:    General: Skin is warm and dry.     Coloration: Skin is not pale.     Findings: No erythema or rash.     Comments: Solar lentigines diffusely   Neurological:     Mental Status: She is alert. Mental status is at baseline.     Cranial Nerves: No cranial nerve deficit.     Motor: No abnormal muscle tone.     Coordination: Coordination normal.     Gait: Gait normal.     Deep Tendon Reflexes: Reflexes are normal and symmetric. Reflexes normal.  Psychiatric:        Mood and Affect: Mood normal.        Cognition and Memory: Cognition and memory normal.           Assessment & Plan:   Problem List Items Addressed This Visit       Cardiovascular and Mediastinum   HYPERTENSION, BENIGN ESSENTIAL    bp in fair control at this time  BP Readings from Last 1 Encounters:  04/28/23 126/68   No changes needed Most recent labs reviewed  Disc lifstyle change with low sodium diet and exercise  Taking amlodipine 5 mg daily  Lisinopril 10 mg daily         Musculoskeletal and Integument   Osteopenia    Dexa 06/2022  No falls or fracture  D level is in treatment range   Discussed fall prevention, supplements and exercise for bone density          Other   Abnormal EKG    Baseline today with ST depression  No change from previous  Reviewed last cardiology note and normal stress echo 2016       Hyperlipidemia    Disc goals for lipids and reasons to control them Rev last labs with pt Rev low sat fat diet in detail Taking atorvastatin 20 mg daily  Good diet  LDL si 103 with good HDL in 60s        Pre-operative clearance    Current medical problems are well controlled Labs are stable EKG is stable (baseline ST depression)  Reviewed cardiology visit and testing 2016- reassuring  No new cardiac or pulmonary problems  Exercise tolerance is good  Medically cleared for upcoming knee arthroscopy  Instructed to hold asa 1 wk prior  Instructed to discuss letrozole with oncologist to see if this needs to be held as well        Routine general medical examination at a health care facility    Reviewed health habits including diet and exercise and skin cancer prevention Reviewed appropriate screening tests for age  Also reviewed health mt list, fam hx and immunization status , as well as social and family history   See HPI Labs reviewed and ordered Flu shot given  Considering shingrix at pharmacy /had zostavax in past  Commended weight loss  Mammogram scheduled next month/personal history of breast cancer  Colonoscpy 08/2020 with 3 y recall  Dexa utd Discussed fall prevention, supplements and exercise for bone density   PHQ 1       Vitamin D deficiency    Last vitamin D Lab Results  Component Value Date   VD25OH 43.37 04/21/2023   Vitamin D level is therapeutic with current supplementation Disc importance of this to bone and overall health       Other Visit Diagnoses  Pre-operative cardiovascular examination    -  Primary   Relevant Orders   EKG 12-Lead (Completed)   Need for influenza vaccination       Relevant Orders   Flu Vaccine Trivalent High Dose (Fluad) (Completed)

## 2023-04-28 NOTE — Assessment & Plan Note (Signed)
Current medical problems are well controlled Labs are stable EKG is stable (baseline ST depression)  Reviewed cardiology visit and testing 2016- reassuring  No new cardiac or pulmonary problems  Exercise tolerance is good  Medically cleared for upcoming knee arthroscopy  Instructed to hold asa 1 wk prior  Instructed to discuss letrozole with oncologist to see if this needs to be held as well

## 2023-04-28 NOTE — Patient Instructions (Addendum)
Add some strength training to your routine, this is important for bone and brain health and can reduce your risk of falls and help your body use insulin properly and regulate weight  Light weights, exercise bands , and internet videos are a good way to start  Yoga (chair or regular), machines , floor exercises or a gym with machines are also good options    If you are interested in the shingles vaccine series (Shingrix), call your insurance or pharmacy to check on coverage and location it must be given.  If affordable - you can schedule it here or at your pharmacy depending on coverage   Touch base with your oncologist about the letrozole and your surgery  Also ask about the aspirin   Hold the aspirin for a week before surgery   Flu shot today

## 2023-04-28 NOTE — Assessment & Plan Note (Signed)
Last vitamin D Lab Results  Component Value Date   VD25OH 43.37 04/21/2023   Vitamin D level is therapeutic with current supplementation Disc importance of this to bone and overall health

## 2023-05-05 ENCOUNTER — Ambulatory Visit
Admission: RE | Admit: 2023-05-05 | Discharge: 2023-05-05 | Disposition: A | Discharge status: Home / Self Care | Payer: BC Managed Care – PPO | Source: Ambulatory Visit | Attending: General Surgery | Admitting: General Surgery

## 2023-05-05 DIAGNOSIS — Z1231 Encounter for screening mammogram for malignant neoplasm of breast: Secondary | ICD-10-CM | POA: Diagnosis not present

## 2023-05-12 ENCOUNTER — Encounter: Payer: Self-pay | Admitting: Internal Medicine

## 2023-05-13 DIAGNOSIS — C50212 Malignant neoplasm of upper-inner quadrant of left female breast: Secondary | ICD-10-CM | POA: Diagnosis not present

## 2023-05-28 DIAGNOSIS — M6752 Plica syndrome, left knee: Secondary | ICD-10-CM | POA: Diagnosis not present

## 2023-05-28 DIAGNOSIS — G8918 Other acute postprocedural pain: Secondary | ICD-10-CM | POA: Diagnosis not present

## 2023-05-28 DIAGNOSIS — M94262 Chondromalacia, left knee: Secondary | ICD-10-CM | POA: Diagnosis not present

## 2023-05-28 DIAGNOSIS — Y999 Unspecified external cause status: Secondary | ICD-10-CM | POA: Diagnosis not present

## 2023-05-28 DIAGNOSIS — M1712 Unilateral primary osteoarthritis, left knee: Secondary | ICD-10-CM | POA: Diagnosis not present

## 2023-05-28 DIAGNOSIS — S83242A Other tear of medial meniscus, current injury, left knee, initial encounter: Secondary | ICD-10-CM | POA: Diagnosis not present

## 2023-06-10 ENCOUNTER — Other Ambulatory Visit: Payer: Self-pay | Admitting: Family Medicine

## 2023-06-10 ENCOUNTER — Other Ambulatory Visit: Payer: Self-pay | Admitting: Internal Medicine

## 2023-06-10 DIAGNOSIS — Z170 Estrogen receptor positive status [ER+]: Secondary | ICD-10-CM

## 2023-06-12 DIAGNOSIS — M25562 Pain in left knee: Secondary | ICD-10-CM | POA: Diagnosis not present

## 2023-06-17 DIAGNOSIS — M25562 Pain in left knee: Secondary | ICD-10-CM | POA: Diagnosis not present

## 2023-06-19 DIAGNOSIS — M25562 Pain in left knee: Secondary | ICD-10-CM | POA: Diagnosis not present

## 2023-06-23 DIAGNOSIS — M25562 Pain in left knee: Secondary | ICD-10-CM | POA: Diagnosis not present

## 2023-06-25 DIAGNOSIS — M25562 Pain in left knee: Secondary | ICD-10-CM | POA: Diagnosis not present

## 2023-07-01 DIAGNOSIS — M25562 Pain in left knee: Secondary | ICD-10-CM | POA: Diagnosis not present

## 2023-07-03 ENCOUNTER — Encounter: Payer: Self-pay | Admitting: Internal Medicine

## 2023-07-03 ENCOUNTER — Inpatient Hospital Stay: Payer: BC Managed Care – PPO | Admitting: Internal Medicine

## 2023-07-03 ENCOUNTER — Inpatient Hospital Stay: Payer: BC Managed Care – PPO | Attending: Radiation Oncology

## 2023-07-03 VITALS — BP 126/73 | HR 73 | Temp 97.8°F | Ht 64.0 in | Wt 156.2 lb

## 2023-07-03 DIAGNOSIS — C50212 Malignant neoplasm of upper-inner quadrant of left female breast: Secondary | ICD-10-CM | POA: Diagnosis not present

## 2023-07-03 DIAGNOSIS — M8580 Other specified disorders of bone density and structure, unspecified site: Secondary | ICD-10-CM | POA: Insufficient documentation

## 2023-07-03 DIAGNOSIS — M25562 Pain in left knee: Secondary | ICD-10-CM | POA: Diagnosis not present

## 2023-07-03 DIAGNOSIS — Z801 Family history of malignant neoplasm of trachea, bronchus and lung: Secondary | ICD-10-CM | POA: Insufficient documentation

## 2023-07-03 DIAGNOSIS — Z170 Estrogen receptor positive status [ER+]: Secondary | ICD-10-CM

## 2023-07-03 DIAGNOSIS — Z90710 Acquired absence of both cervix and uterus: Secondary | ICD-10-CM | POA: Insufficient documentation

## 2023-07-03 DIAGNOSIS — Z803 Family history of malignant neoplasm of breast: Secondary | ICD-10-CM | POA: Insufficient documentation

## 2023-07-03 DIAGNOSIS — Z79811 Long term (current) use of aromatase inhibitors: Secondary | ICD-10-CM | POA: Diagnosis not present

## 2023-07-03 DIAGNOSIS — Z87891 Personal history of nicotine dependence: Secondary | ICD-10-CM | POA: Diagnosis not present

## 2023-07-03 DIAGNOSIS — Z1721 Progesterone receptor positive status: Secondary | ICD-10-CM | POA: Diagnosis not present

## 2023-07-03 LAB — CMP (CANCER CENTER ONLY)
ALT: 17 U/L (ref 0–44)
AST: 16 U/L (ref 15–41)
Albumin: 4.4 g/dL (ref 3.5–5.0)
Alkaline Phosphatase: 59 U/L (ref 38–126)
Anion gap: 6 (ref 5–15)
BUN: 16 mg/dL (ref 8–23)
CO2: 28 mmol/L (ref 22–32)
Calcium: 9.5 mg/dL (ref 8.9–10.3)
Chloride: 104 mmol/L (ref 98–111)
Creatinine: 0.72 mg/dL (ref 0.44–1.00)
GFR, Estimated: >60 mL/min (ref >60)
Glucose, Bld: 92 mg/dL (ref 70–99)
Potassium: 4.3 mmol/L (ref 3.5–5.1)
Sodium: 138 mmol/L (ref 135–145)
Total Bilirubin: 0.5 mg/dL (ref <1.2)
Total Protein: 7.4 g/dL (ref 6.5–8.1)

## 2023-07-03 LAB — CBC WITH DIFFERENTIAL (CANCER CENTER ONLY)
Abs Immature Granulocytes: 0.01 10*3/uL (ref 0.00–0.07)
Basophils Absolute: 0.1 10*3/uL (ref 0.0–0.1)
Basophils Relative: 1 %
Eosinophils Absolute: 0.1 10*3/uL (ref 0.0–0.5)
Eosinophils Relative: 1 %
HCT: 36.8 % (ref 36.0–46.0)
Hemoglobin: 12 g/dL (ref 12.0–15.0)
Immature Granulocytes: 0 %
Lymphocytes Relative: 34 %
Lymphs Abs: 1.6 10*3/uL (ref 0.7–4.0)
MCH: 29.1 pg (ref 26.0–34.0)
MCHC: 32.6 g/dL (ref 30.0–36.0)
MCV: 89.1 fL (ref 80.0–100.0)
Monocytes Absolute: 0.5 10*3/uL (ref 0.1–1.0)
Monocytes Relative: 11 %
Neutro Abs: 2.4 10*3/uL (ref 1.7–7.7)
Neutrophils Relative %: 53 %
Platelet Count: 196 10*3/uL (ref 150–400)
RBC: 4.13 MIL/uL (ref 3.87–5.11)
RDW: 13.5 % (ref 11.5–15.5)
WBC Count: 4.6 10*3/uL (ref 4.0–10.5)
nRBC: 0 % (ref 0.0–0.2)

## 2023-07-03 NOTE — Assessment & Plan Note (Addendum)
#  Left breast cancer stage I ER/PR positive HER-2 negative; s/p lumpectomy; on adjuvant letrozole until 2026-fall ] mammogram Oct 2024 -Dr. Maia Plan. Stable.   # On letrozole tolerating  fairly well.  Again reviewed that she will continue until fall 2026.  # Osteopenia-reviewed OCT 2022- T score+ -1.4-osteopenia; DEC 2023-  T-score of -1.5. Given the stability of the bone density recommend continued monitoring. Patient physically active/exercising.  continue  MVT- Ca 1200 mg+ 1000+  vit D 2000 mg/day.   MARCH 2024- vit D 61.  Will repeat BMD in 2025.   # Weight gain- s/p keto diet/ low carb diet- congratulated. Exercise continue.    mychart # DISPOSITION: # Follow-up in 6 months-MD; cbc/cmp; vit D 25-OH levels--  Dr. Leonard Schwartz.

## 2023-07-03 NOTE — Progress Notes (Signed)
[facility] CONSULT NOTE  Patient Care Team: Tower, Audrie Gallus, MD as PCP - General Carmina Miller, MD as Referring Physician (Radiation Oncology) Jim Like, RN as Oncology Nurse Navigator Carolan Shiver, MD as Consulting Physician (General Surgery) Earna Coder, MD as Consulting Physician (Internal Medicine)  CHIEF COMPLAINTS/PURPOSE OF CONSULTATION: Breast cancer  #  Oncology History Overview Note  # LEFT BREAST CA- INVASIVE MAMMARY CARCINOMA, NO SPECIAL TYPE, WITH MICROPAPILLARY  FEATURES; ER/PR Positive [90%]; Her 2 NEG; G-1; 0.8 x 0.7 x 1 cm.  Oncotype recurrence score= 0; 3% distant recurrence; no chemotherapy; adjuvant radiation;  s/p RT [nov 5th, 2021];   # NOV end 221- START Anastrazole[previous HRT*]; JUNE 16th, 2023- Discontinue anastrozole; switch over to exemestane given patient's MSK side effects  # # SURVIVORSHIP:   # GENETICS: No pathogenic variants; VUS*  DIAGNOSIS: Cure  STAGE: 1        ;  GOALS: Cure  CURRENT/MOST RECENT THERAPY :     Carcinoma of upper-inner quadrant of left breast in female, estrogen receptor positive (HCC)  03/01/2020 Initial Diagnosis   Carcinoma of upper-inner quadrant of left breast in female, estrogen receptor positive (HCC)    Genetic Testing   Negative genetic testing. No pathogenic variants identified on the [facility] Multi-Cancer Panel. VUS in AIP called c.790_792del and VUS in RET called c.1163T>C identified. The report date is 04/07/2020.  The Multi-Cancer Panel offered by [facility] includes sequencing and/or deletion duplication testing of the following 85 genes: AIP, ALK, APC, ATM, AXIN2,BAP1,  BARD1, BLM, BMPR1A, BRCA1, BRCA2, BRIP1, CASR, CDC73, CDH1, CDK4, CDKN1B, CDKN1C, CDKN2A (p14ARF), CDKN2A (p16INK4a), CEBPA, CHEK2, CTNNA1, DICER1, DIS3L2, EGFR (c.2369C>T, p.Thr790Met variant only), EPCAM (Deletion/duplication testing only), FH, FLCN, GATA2, GPC3, GREM1 (Promoter region deletion/duplication  testing only), HOXB13 (c.251G>A, p.Gly84Glu), HRAS, KIT, MAX, MEN1, MET, MITF (c.952G>A, p.Glu318Lys variant only), MLH1, MSH2, MSH3, MSH6, MUTYH, NBN, NF1, NF2, NTHL1, PALB2, PDGFRA, PHOX2B, PMS2, POLD1, POLE, POT1, PRKAR1A, PTCH1, PTEN, RAD50, RAD51C, RAD51D, RB1, RECQL4, RET, RNF43, RUNX1, SDHAF2, SDHA (sequence changes only), SDHB, SDHC, SDHD, SMAD4, SMARCA4, SMARCB1, SMARCE1, STK11, SUFU, TERC, TERT, TMEM127, TP53, TSC1, TSC2, VHL, WRN and WT1.     HISTORY OF PRESENTING ILLNESS: Patient ambulating-independently.   Alone.  Brittney Johnson 67 y.o.  female diagnosis of stage I ER/PR positive HER-2 negative breast cancer on adjuvant letrozole is here for follow-up/review results of the mammogram.  Patient had left knee surgery since last visit, Dr Jillyn Hidden at [facility].   Patient denies any major side effects from letrozole. Energy is good. Appetite is normal.  She continues to be compliant with vitamin D.  Denies any nausea vomiting abdominal pain.  No fever no chills.  Review of Systems  Constitutional:  Negative for chills, diaphoresis, fever, malaise/fatigue and weight loss.  HENT:  Negative for nosebleeds and sore throat.   Eyes:  Negative for double vision.  Respiratory:  Negative for cough, hemoptysis, sputum production, shortness of breath and wheezing.   Cardiovascular:  Negative for chest pain, palpitations, orthopnea and leg swelling.  Gastrointestinal:  Negative for abdominal pain, blood in stool, constipation, diarrhea, heartburn, melena, nausea and vomiting.  Genitourinary:  Negative for dysuria, frequency and urgency.  Musculoskeletal:  Positive for joint pain and myalgias. Negative for back pain.  Skin: Negative.  Negative for itching and rash.  Neurological:  Negative for dizziness, tingling, focal weakness, weakness and headaches.  Endo/Heme/Allergies:  Does not bruise/bleed easily.  Psychiatric/Behavioral:  Negative for depression. The patient is not nervous/anxious and  does not have insomnia.      MEDICAL HISTORY:  Past Medical History:  Diagnosis Date   Atypical pneumonia 1998   not hosp   Breast cancer (HCC) 2021   left-sx, radation completed   DDD (degenerative disc disease), cervical    Family history of breast cancer    Family history of lung cancer    Hyperlipidemia    Hypertension    Low back pain    on and off   PONV (postoperative nausea and vomiting)    due to some sedations!   Syncope and collapse 07/16/2007    SURGICAL HISTORY: Past Surgical History:  Procedure Laterality Date   ABDOMINAL HYSTERECTOMY     supracervical- L ovary remains   BREAST BIOPSY Left 02/22/2020   Q clip [place] bx, [facility]   CARDIOVASCULAR STRESS TEST     08/28/06 normal    COLONOSCOPY  08/13/2016   [facility]   left hand surgery Left 2016   Dupuytrens   LUMBAR DISC SURGERY  08/2017   [facility] Emery   OOPHORECTOMY Right    Right ovary removed    PARTIAL MASTECTOMY WITH NEEDLE LOCALIZATION AND AXILLARY SENTINEL LYMPH NODE BX Left 03/15/2020   Procedure: PARTIAL MASTECTOMY WITH RF TAG PLACEMENT AND AXILLARY SENTINEL LYMPH NODE BX;  Surgeon: Carolan Shiver, MD;  Location: [facility] ORS;  Service: General;  Laterality: Left;   POLYPECTOMY     TUBAL LIGATION     ? 1979    SOCIAL HISTORY: Social History   Socioeconomic History   Marital status: Married    Spouse name: Not on file   Number of children: Not on file   Years of education: Not on file   Highest education level: Not on file  Occupational History   Occupation: Production designer, theatre/television/film: [employer]  Tobacco Use   Smoking status: Former    Current packs/day: 0.00    Average packs/day: 0.5 packs/day for 5.0 years (2.5 ttl pk-yrs)    Types: Cigarettes    Start date: 07/29/1992    Quit date: 07/29/1997    Years since quitting: 25.9   Smokeless tobacco: Never  Vaping Use   Vaping status: Never Used  Substance and Sexual Activity   Alcohol use: Yes    Alcohol/week: 1.0 standard drink of  alcohol    Types: 1 Glasses of wine per week   Drug use: No   Sexual activity: Yes  Other Topics Concern   Not on file  Social History Narrative   Kids live close by, 3 grandkids- really enjoys, runs for exercise, ran a 5k March 2011; walks; quit 20 years; rare alcohol. Work for [employer]; lives in [place]. 2 boys- 40s.    Social Determinants of Health   Financial Resource Strain: Not on file  Food Insecurity: Not on file  Transportation Needs: Not on file  Physical Activity: Not on file  Stress: Not on file  Social Connections: Not on file  Intimate Partner Violence: Not on file    FAMILY HISTORY: Family History  Problem Relation Age of Onset   Lung cancer Father 54   Hyperlipidemia Mother    Diabetes Mother    Hypertension Mother    Stroke Mother    Heart disease Other    Lung cancer Other        uncle   Diabetes Other        aunt   Coronary artery disease Maternal Grandfather    Breast cancer Maternal Aunt  dx under 50   Lung cancer Maternal Uncle    Lung cancer Paternal Uncle    Cancer Cousin        unk types   Cancer Cousin        unk types   Thyroid disease Neg Hx    Colon cancer Neg Hx    Colon polyps Neg Hx    Esophageal cancer Neg Hx    Rectal cancer Neg Hx    Stomach cancer Neg Hx     ALLERGIES:  is allergic to codeine, bee venom, and atorvastatin.  MEDICATIONS:  Current Outpatient Medications  Medication Sig Dispense Refill   amLODipine (NORVASC) 5 MG tablet TAKE 1 TABLET DAILY 90 tablet 3   aspirin 81 MG EC tablet Take 81 mg by mouth every Monday, Wednesday, and Friday.      Calcium Citrate-Vitamin D (CALCIUM + D PO) Take by mouth.     cholecalciferol (VITAMIN D3) 25 MCG (1000 UNIT) tablet Take 2,000 Units by mouth daily.     cyanocobalamin (VITAMIN B12) 1000 MCG tablet Take 1,000 mcg by mouth daily.     letrozole (FEMARA) 2.5 MG tablet TAKE 1 TABLET DAILY 90 tablet 3   Lifitegrast (XIIDRA) 5 % SOLN Place 1 drop into both eyes at  bedtime.      lisinopril (ZESTRIL) 10 MG tablet TAKE 1 TABLET DAILY 90 tablet 3   Omega-3 Fatty Acids (FISH OIL) 1200 MG CAPS Take 1,200 mg by mouth 2 (two) times daily.     rosuvastatin (CRESTOR) 5 MG tablet TAKE 1 TABLET DAILY 90 tablet 3   No current facility-administered medications for this visit.      Marland Kitchen  PHYSICAL EXAMINATION: ECOG PERFORMANCE STATUS: 0 - Asymptomatic  Vitals:   07/03/23 1020 07/03/23 1030  BP: (!) 143/73 126/73  Pulse: 73   Temp: 97.8 F (36.6 C)   SpO2: 98%    Filed Weights   07/03/23 1020  Weight: 156 lb 3.2 oz (70.9 kg)    Physical Exam HENT:     Head: Normocephalic and atraumatic.     Mouth/Throat:     Pharynx: No oropharyngeal exudate.  Eyes:     Pupils: Pupils are equal, round, and reactive to light.  Cardiovascular:     Rate and Rhythm: Normal rate and regular rhythm.  Pulmonary:     Effort: Pulmonary effort is normal. No respiratory distress.     Breath sounds: Normal breath sounds. No wheezing.  Abdominal:     General: Bowel sounds are normal. There is no distension.     Palpations: Abdomen is soft. There is no mass.     Tenderness: There is no abdominal tenderness. There is no guarding or rebound.  Musculoskeletal:        General: No tenderness. Normal range of motion.     Cervical back: Normal range of motion and neck supple.  Skin:    General: Skin is warm.  Neurological:     Mental Status: She is alert and oriented to person, place, and time.  Psychiatric:        Mood and Affect: Affect normal.    LABORATORY DATA:  I have reviewed the data as listed Lab Results  Component Value Date   WBC 4.6 07/03/2023   HGB 12.0 07/03/2023   HCT 36.8 07/03/2023   MCV 89.1 07/03/2023   PLT 196 07/03/2023   Recent Labs    10/02/22 1315 01/01/23 1031 04/21/23 0832 07/03/23 1009  NA 135 137 139 138  K 4.2 4.6 4.2 4.3  CL 100 102 103 104  CO2 25 26 27 28   GLUCOSE 99 92 96 92  BUN 16 16 15 16   CREATININE 0.71 0.71 0.73 0.72   CALCIUM 10.1 9.6 9.7 9.5  GFRNONAA >60 >60  --  >60  PROT 8.2* 7.8 6.9 7.4  ALBUMIN 4.6 4.4 4.4 4.4  AST 20 18 15 16   ALT 20 20 13 17   ALKPHOS 61 64 54 59  BILITOT 0.4 0.4 0.5 0.5    RADIOGRAPHIC STUDIES: I have personally reviewed the radiological images as listed and agreed with the findings in the report. No results found.  ASSESSMENT & PLAN:   Carcinoma of upper-inner quadrant of left breast in female, estrogen receptor positive (HCC) #Left breast cancer stage I ER/PR positive HER-2 negative; s/p lumpectomy; on adjuvant letrozole until 2026-fall ] mammogram Oct 2024 -Dr. Maia Plan. Stable.   # On letrozole tolerating  fairly well.  Again reviewed that she will continue until fall 2026.  # Osteopenia-reviewed OCT 2022- T score+ -1.4-osteopenia; DEC 2023-  T-score of -1.5. Given the stability of the bone density recommend continued monitoring. Patient physically active/exercising.  continue  MVT- Ca 1200 mg+ 1000+  vit D 2000 mg/day.   MARCH 2024- vit D 61.  Will repeat BMD in 2025.   # Weight gain- s/p keto diet/ low carb diet- congratulated. Exercise continue.    mychart # DISPOSITION: # Follow-up in 6 months-MD; cbc/cmp; vit D 25-OH levels--  Dr. Fabio Neighbors, MD 07/03/2023 11:03 AM

## 2023-07-03 NOTE — Progress Notes (Signed)
Had left knee surgery since last visit, Dr Jillyn Hidden at [facility].

## 2023-07-08 DIAGNOSIS — M25562 Pain in left knee: Secondary | ICD-10-CM | POA: Diagnosis not present

## 2023-07-10 DIAGNOSIS — M25562 Pain in left knee: Secondary | ICD-10-CM | POA: Diagnosis not present

## 2023-08-20 ENCOUNTER — Ambulatory Visit: Payer: BC Managed Care – PPO | Admitting: Dermatology

## 2023-09-10 ENCOUNTER — Encounter: Payer: Self-pay | Admitting: Dermatology

## 2023-09-10 ENCOUNTER — Ambulatory Visit (INDEPENDENT_AMBULATORY_CARE_PROVIDER_SITE_OTHER): Payer: BC Managed Care – PPO | Admitting: Dermatology

## 2023-09-10 DIAGNOSIS — D1801 Hemangioma of skin and subcutaneous tissue: Secondary | ICD-10-CM

## 2023-09-10 DIAGNOSIS — W908XXA Exposure to other nonionizing radiation, initial encounter: Secondary | ICD-10-CM | POA: Diagnosis not present

## 2023-09-10 DIAGNOSIS — D229 Melanocytic nevi, unspecified: Secondary | ICD-10-CM

## 2023-09-10 DIAGNOSIS — Z872 Personal history of diseases of the skin and subcutaneous tissue: Secondary | ICD-10-CM

## 2023-09-10 DIAGNOSIS — L821 Other seborrheic keratosis: Secondary | ICD-10-CM

## 2023-09-10 DIAGNOSIS — L570 Actinic keratosis: Secondary | ICD-10-CM | POA: Diagnosis not present

## 2023-09-10 DIAGNOSIS — Z1283 Encounter for screening for malignant neoplasm of skin: Secondary | ICD-10-CM

## 2023-09-10 DIAGNOSIS — L578 Other skin changes due to chronic exposure to nonionizing radiation: Secondary | ICD-10-CM | POA: Diagnosis not present

## 2023-09-10 DIAGNOSIS — L814 Other melanin hyperpigmentation: Secondary | ICD-10-CM | POA: Diagnosis not present

## 2023-09-10 DIAGNOSIS — L918 Other hypertrophic disorders of the skin: Secondary | ICD-10-CM

## 2023-09-10 DIAGNOSIS — D2262 Melanocytic nevi of left upper limb, including shoulder: Secondary | ICD-10-CM

## 2023-09-10 DIAGNOSIS — L738 Other specified follicular disorders: Secondary | ICD-10-CM

## 2023-09-10 DIAGNOSIS — D2261 Melanocytic nevi of right upper limb, including shoulder: Secondary | ICD-10-CM

## 2023-09-10 NOTE — Progress Notes (Signed)
Follow-Up Visit   Subjective  Brittney Johnson is a 67 y.o. female who presents for the following: Skin Cancer Screening and Full Body Skin Exam hx of Aks, check spots shoulders, rough feeling,  The patient presents for Total-Body Skin Exam (TBSE) for skin cancer screening and mole check. The patient has spots, moles and lesions to be evaluated, some may be new or changing and the patient may have concern these could be cancer.    The following portions of the chart were reviewed this encounter and updated as appropriate: medications, allergies, medical history  Review of Systems:  No other skin or systemic complaints except as noted in HPI or Assessment and Plan.  Objective  Well appearing patient in no apparent distress; mood and affect are within normal limits.  A full examination was performed including scalp, head, eyes, ears, nose, lips, neck, chest, axillae, abdomen, back, buttocks, bilateral upper extremities, bilateral lower extremities, hands, feet, fingers, toes, fingernails, and toenails. All findings within normal limits unless otherwise noted below.   Relevant physical exam findings are noted in the Assessment and Plan.  L shoulder x 1, R shoulder x 1 (2) Pink scaly macules  Assessment & Plan   SKIN CANCER SCREENING PERFORMED TODAY.  ACTINIC DAMAGE - Chronic condition, secondary to cumulative UV/sun exposure - diffuse scaly erythematous macules with underlying dyspigmentation - Recommend daily broad spectrum sunscreen SPF 30+ to sun-exposed areas, reapply every 2 hours as needed.  - Staying in the shade or wearing long sleeves, sun glasses (UVA+UVB protection) and wide brim hats (4-inch brim around the entire circumference of the hat) are also recommended for sun protection.  - Call for new or changing lesions.  LENTIGINES, SEBORRHEIC KERATOSES, HEMANGIOMAS - Benign normal skin lesions - Benign-appearing - Call for any changes - trunk, arms, legs  MELANOCYTIC  NEVI - Tan-brown and/or pink-flesh-colored symmetric macules and papules, including L shoulder, right axilla - Benign appearing on exam today - Observation - Call clinic for new or changing moles - Recommend daily use of broad spectrum spf 30+ sunscreen to sun-exposed areas.   Acrochordons (Skin Tags) axilla - Fleshy, skin-colored pedunculated papules - Benign appearing.  - Observe. - If desired, they can be removed with an in office procedure that is not covered by insurance. - Please call the clinic if you notice any new or changing lesions.   Sebaceous Hyperplasia L lat eyebrow, glabella, forehead - Small yellow papules with a central dell - Benign-appearing - Observe. Call for changes.   AK (ACTINIC KERATOSIS) (2) L shoulder x 1, R shoulder x 1 (2) Actinic keratoses are precancerous spots that appear secondary to cumulative UV radiation exposure/sun exposure over time. They are chronic with expected duration over 1 year. A portion of actinic keratoses will progress to squamous cell carcinoma of the skin. It is not possible to reliably predict which spots will progress to skin cancer and so treatment is recommended to prevent development of skin cancer.  Recommend daily broad spectrum sunscreen SPF 30+ to sun-exposed areas, reapply every 2 hours as needed.  Recommend staying in the shade or wearing long sleeves, sun glasses (UVA+UVB protection) and wide brim hats (4-inch brim around the entire circumference of the hat). Call for new or changing lesions. Destruction of lesion - L shoulder x 1, R shoulder x 1 (2)  Destruction method: cryotherapy   Informed consent: discussed and consent obtained   Lesion destroyed using liquid nitrogen: Yes   Region frozen until ice ball extended  beyond lesion: Yes   Outcome: patient tolerated procedure well with no complications   Post-procedure details: wound care instructions given   Additional details:  Prior to procedure, discussed risks of  blister formation, small wound, skin dyspigmentation, or rare scar following cryotherapy. Recommend Vaseline ointment to treated areas while healing.   Return in about 1 year (around 09/09/2024) for TBSE, Hx of AKs.  I, Ardis Rowan, RMA, am acting as scribe for Willeen Niece, MD .   Documentation: I have reviewed the above documentation for accuracy and completeness, and I agree with the above.  Willeen Niece, MD

## 2023-09-10 NOTE — Patient Instructions (Addendum)
Skin tag removal generally is considered cosmetic and not covered by insurance.  Cosmetic removal fee is $115 for up to 15 tags removed. You can contact your insurance to see if skin tag removal is a medically necessary covered benefit.  Even if covered, copay and deductible payments would apply. CPT code: 16109 Diagnosis code: L91.8    Cryotherapy Aftercare  Wash gently with soap and water everyday.   Apply Vaseline and Band-Aid daily until healed.    Seborrheic Keratosis  What causes seborrheic keratoses? Seborrheic keratoses are harmless, common skin growths that first appear during adult life.  As time goes by, more growths appear.  Some people may develop a large number of them.  Seborrheic keratoses appear on both covered and uncovered body parts.  They are not caused by sunlight.  The tendency to develop seborrheic keratoses can be inherited.  They vary in color from skin-colored to gray, brown, or even black.  They can be either smooth or have a rough, warty surface.   Seborrheic keratoses are superficial and look as if they were stuck on the skin.  Under the microscope this type of keratosis looks like layers upon layers of skin.  That is why at times the top layer may seem to fall off, but the rest of the growth remains and re-grows.    Treatment Seborrheic keratoses do not need to be treated, but can easily be removed in the office.  Seborrheic keratoses often cause symptoms when they rub on clothing or jewelry.  Lesions can be in the way of shaving.  If they become inflamed, they can cause itching, soreness, or burning.  Removal of a seborrheic keratosis can be accomplished by freezing, burning, or surgery. If any spot bleeds, scabs, or grows rapidly, please return to have it checked, as these can be an indication of a skin cancer.   Due to recent changes in healthcare laws, you may see results of your pathology and/or laboratory studies on MyChart before the doctors have had a  chance to review them. We understand that in some cases there may be results that are confusing or concerning to you. Please understand that not all results are received at the same time and often the doctors may need to interpret multiple results in order to provide you with the best plan of care or course of treatment. Therefore, we ask that you please give [place] 2 business days to thoroughly review all your results before contacting the office for clarification. Should we see a critical lab result, you will be contacted sooner.   If You Need Anything After Your Visit  If you have any questions or concerns for your doctor, please call our main line at [phone] and press option 4 to reach your doctor's medical assistant. If no one answers, please leave a voicemail as directed and we will return your call as soon as possible. Messages left after 4 pm will be answered the following business day.   You may also send [place] a message via MyChart. We typically respond to MyChart messages within 1-2 business days.  For prescription refills, please ask your pharmacy to contact our office. Our fax number is [phone].  If you have an urgent issue when the clinic is closed that cannot wait until the next business day, you can page your doctor at the number below.    Please note that while we do our best to be available for urgent issues outside of office hours, we are not  available 24/7.   If you have an urgent issue and are unable to reach [place], you may choose to seek medical care at your doctor's office, retail clinic, urgent care center, or emergency room.  If you have a medical emergency, please immediately call 911 or go to the emergency department.  Pager Numbers  - Dr. Gwen Pounds: [phone]  - Dr. Roseanne Reno: [phone]  - Dr. Katrinka Blazing: [phone]   In the event of inclement weather, please call our main line at [phone] for an update on the status of any delays or closures.  Dermatology  Medication Tips: Please keep the boxes that topical medications come in in order to help keep track of the instructions about where and how to use these. Pharmacies typically print the medication instructions only on the boxes and not directly on the medication tubes.   If your medication is too expensive, please contact our office at [phone] option 4 or send [place] a message through MyChart.   We are unable to tell what your co-pay for medications will be in advance as this is different depending on your insurance coverage. However, we may be able to find a substitute medication at lower cost or fill out paperwork to get insurance to cover a needed medication.   If a prior authorization is required to get your medication covered by your insurance company, please allow [place] 1-2 business days to complete this process.  Drug prices often vary depending on where the prescription is filled and some pharmacies may offer cheaper prices.  The website www.goodrx.com contains coupons for medications through different pharmacies. The prices here do not account for what the cost may be with help from insurance (it may be cheaper with your insurance), but the website can give you the price if you did not use any insurance.  - You can print the associated coupon and take it with your prescription to the pharmacy.  - You may also stop by our office during regular business hours and pick up a GoodRx coupon card.  - If you need your prescription sent electronically to a different pharmacy, notify our office through [facility] or by phone at [phone] option 4.     Si Usted Necesita Algo Despus de Su Visita  Tambin puede enviarnos un mensaje a travs de Clinical cytogeneticist. Por lo general respondemos a los mensajes de MyChart en el transcurso de 1 a 2 das hbiles.  Para renovar recetas, por favor pida a su farmacia que se ponga en contacto con nuestra oficina. Annie Sable de fax es [place] [phone].  Si  tiene un asunto urgente cuando la clnica est cerrada y que no puede esperar hasta el siguiente da hbil, puede llamar/localizar a su doctor(a) al nmero que aparece a continuacin.   Por favor, tenga en cuenta que aunque hacemos todo lo posible para estar disponibles para asuntos urgentes fuera del horario de [place], no estamos disponibles las 24 horas del da, los 7 [address]  [address] de la [place].   Si tiene un problema urgente y no puede comunicarse con nosotros, puede optar por buscar atencin mdica  en el consultorio de su doctor(a), en una clnica privada, en un centro de atencin urgente o en una sala de emergencias.  Si tiene Engineer, drilling, por favor llame inmediatamente al 911 o vaya a la sala de emergencias.  Nmeros de bper  - Dr. Gwen Pounds: [phone]  - Dra. Roseanne Reno: [phone]  - Dr. Katrinka Blazing: [phone]   En caso de inclemencias del [place], por  favor llame a nuestra lnea principal al [phone] para una actualizacin sobre el [place] de cualquier retraso o cierre.  Consejos para la medicacin en dermatologa: Por favor, guarde las cajas en las que vienen los medicamentos de uso tpico para ayudarle a seguir las instrucciones sobre dnde y cmo usarlos. Las farmacias generalmente imprimen las instrucciones del medicamento slo en las cajas y no directamente en los tubos del [place].   Si su medicamento es muy caro, por favor, pngase en contacto con Rolm Gala llamando al [phone] y presione la opcin 4 o envenos un mensaje a travs de Clinical cytogeneticist.   No podemos decirle cul ser su copago por los medicamentos por adelantado ya que esto es diferente dependiendo de la cobertura de su seguro. Sin embargo, es posible que podamos encontrar un medicamento sustituto a Audiological scientist un formulario para que el seguro cubra el medicamento que se considera necesario.   Si se requiere una autorizacin previa para que su compaa de seguros [place] su medicamento, por  favor permtanos de 1 a 2 das hbiles para completar [address]  Los precios de los medicamentos varan con frecuencia dependiendo del Environmental consultant de dnde se surte la receta y alguna farmacias pueden ofrecer precios ms baratos.  El sitio web www.goodrx.com tiene cupones para medicamentos de Health and safety inspector. Los precios aqu no tienen en cuenta lo que podra costar con la ayuda del seguro (puede ser ms barato con su seguro), pero el sitio web puede darle el precio si no utiliz Tourist information centre manager.  - Puede imprimir el cupn correspondiente y llevarlo con su receta a la farmacia.  - Tambin puede pasar por nuestra oficina durante el horario de atencin regular y Education officer, museum una tarjeta de cupones de GoodRx.  - Si necesita que su receta se enve electrnicamente a una farmacia diferente, informe a nuestra oficina a travs de MyChart de [place] o por telfono llamando al [phone] y presione la opcin 4.

## 2023-10-23 ENCOUNTER — Ambulatory Visit
Admission: RE | Admit: 2023-10-23 | Discharge: 2023-10-23 | Disposition: A | Discharge status: Home / Self Care | Payer: BC Managed Care – PPO | Source: Ambulatory Visit | Attending: Radiation Oncology | Admitting: Radiation Oncology

## 2023-10-23 ENCOUNTER — Encounter: Payer: Self-pay | Admitting: Radiation Oncology

## 2023-10-23 VITALS — BP 135/74 | HR 71 | Temp 97.7°F | Resp 12 | Wt 160.0 lb

## 2023-10-23 DIAGNOSIS — Z170 Estrogen receptor positive status [ER+]: Secondary | ICD-10-CM | POA: Insufficient documentation

## 2023-10-23 DIAGNOSIS — Z79811 Long term (current) use of aromatase inhibitors: Secondary | ICD-10-CM | POA: Insufficient documentation

## 2023-10-23 DIAGNOSIS — C50212 Malignant neoplasm of upper-inner quadrant of left female breast: Secondary | ICD-10-CM | POA: Diagnosis not present

## 2023-10-23 DIAGNOSIS — C50912 Malignant neoplasm of unspecified site of left female breast: Secondary | ICD-10-CM | POA: Diagnosis not present

## 2023-10-23 DIAGNOSIS — Z923 Personal history of irradiation: Secondary | ICD-10-CM | POA: Diagnosis not present

## 2023-10-23 NOTE — Progress Notes (Signed)
 Radiation Oncology Follow up Note  Name: Brittney Johnson   Date:   10/23/2023 MRN:  [id number] DOB: [birth year]    This 68 y.o. female presents to the clinic today for 3-year follow-up status post whole breast radiation to her left breast for stage I ER/PR positive invasive mammary carcinoma.  REFERRING PROVIDER: Tower, Audrie Gallus, MD  HPI: Patient is a 68 year old female now out 3 years having completed whole breast radiation to her left breast for stage I ER/PR positive invasive mammary carcinoma with micropapillary features.  Seen today in routine follow-up she is doing well.  She specifically denies breast tenderness cough or bone pain..  She had mammograms back in October which I have reviewed BI-RADS 1 negative.  She is currently on Femara tolerating that well without side effect.  COMPLICATIONS OF TREATMENT: none  FOLLOW UP COMPLIANCE: keeps appointments   PHYSICAL EXAM:  BP 135/74   Pulse 71   Temp 97.7 F (36.5 C) (Tympanic)   Resp 12   Wt 160 lb (72.6 kg)   LMP 07/30/2003   BMI 27.46 kg/m  Lungs are clear to A&P cardiac examination essentially unremarkable with regular rate and rhythm. No dominant mass or nodularity is noted in either breast in 2 positions examined. Incision is well-healed. No axillary or supraclavicular adenopathy is appreciated. Cosmetic result is excellent.  Well-developed well-nourished patient in NAD. HEENT reveals PERLA, EOMI, discs not visualized.  Oral cavity is clear. No oral mucosal lesions are identified. Neck is clear without evidence of cervical or supraclavicular adenopathy. Lungs are clear to A&P. Cardiac examination is essentially unremarkable with regular rate and rhythm without murmur rub or thrill. Abdomen is benign with no organomegaly or masses noted. Motor sensory and DTR levels are equal and symmetric in the upper and lower extremities. Cranial nerves II through XII are grossly intact. Proprioception is intact. No peripheral adenopathy or  edema is identified. No motor or sensory levels are noted. Crude visual fields are within normal range.  RADIOLOGY RESULTS: Mammograms reviewed compatible with above-stated findings  PLAN: At the present time patient is doing well with no evidence of disease now at over 3 years.  She continues biannual follow-ups with medical oncology she continues follow-up care with her surgeon.  I am going to discontinue follow-up care.  Patient knows to call anytime with any concerns.  I would like to take this opportunity to thank you for allowing me to participate in the care of your patient.Carmina Miller, MD

## 2024-01-02 ENCOUNTER — Inpatient Hospital Stay: Payer: BC Managed Care – PPO | Attending: Internal Medicine

## 2024-01-02 ENCOUNTER — Encounter: Payer: Self-pay | Admitting: Internal Medicine

## 2024-01-02 ENCOUNTER — Inpatient Hospital Stay (HOSPITAL_BASED_OUTPATIENT_CLINIC_OR_DEPARTMENT_OTHER): Payer: BC Managed Care – PPO | Admitting: Internal Medicine

## 2024-01-02 DIAGNOSIS — Z79811 Long term (current) use of aromatase inhibitors: Secondary | ICD-10-CM | POA: Insufficient documentation

## 2024-01-02 DIAGNOSIS — C50212 Malignant neoplasm of upper-inner quadrant of left female breast: Secondary | ICD-10-CM | POA: Insufficient documentation

## 2024-01-02 DIAGNOSIS — Z801 Family history of malignant neoplasm of trachea, bronchus and lung: Secondary | ICD-10-CM | POA: Insufficient documentation

## 2024-01-02 DIAGNOSIS — Z1732 Human epidermal growth factor receptor 2 negative status: Secondary | ICD-10-CM | POA: Diagnosis not present

## 2024-01-02 DIAGNOSIS — Z170 Estrogen receptor positive status [ER+]: Secondary | ICD-10-CM | POA: Diagnosis not present

## 2024-01-02 DIAGNOSIS — Z87891 Personal history of nicotine dependence: Secondary | ICD-10-CM | POA: Diagnosis not present

## 2024-01-02 DIAGNOSIS — Z90710 Acquired absence of both cervix and uterus: Secondary | ICD-10-CM | POA: Diagnosis not present

## 2024-01-02 DIAGNOSIS — Z1721 Progesterone receptor positive status: Secondary | ICD-10-CM | POA: Insufficient documentation

## 2024-01-02 DIAGNOSIS — Z803 Family history of malignant neoplasm of breast: Secondary | ICD-10-CM | POA: Diagnosis not present

## 2024-01-02 DIAGNOSIS — M8580 Other specified disorders of bone density and structure, unspecified site: Secondary | ICD-10-CM | POA: Diagnosis not present

## 2024-01-02 LAB — CBC WITH DIFFERENTIAL (CANCER CENTER ONLY)
Abs Immature Granulocytes: 0.02 10*3/uL (ref 0.00–0.07)
Basophils Absolute: 0.1 10*3/uL (ref 0.0–0.1)
Basophils Relative: 1 %
Eosinophils Absolute: 0.1 10*3/uL (ref 0.0–0.5)
Eosinophils Relative: 2 %
HCT: 37 % (ref 36.0–46.0)
Hemoglobin: 12.1 g/dL (ref 12.0–15.0)
Immature Granulocytes: 0 %
Lymphocytes Relative: 39 %
Lymphs Abs: 1.8 10*3/uL (ref 0.7–4.0)
MCH: 28.9 pg (ref 26.0–34.0)
MCHC: 32.7 g/dL (ref 30.0–36.0)
MCV: 88.3 fL (ref 80.0–100.0)
Monocytes Absolute: 0.5 10*3/uL (ref 0.1–1.0)
Monocytes Relative: 11 %
Neutro Abs: 2.2 10*3/uL (ref 1.7–7.7)
Neutrophils Relative %: 47 %
Platelet Count: 200 10*3/uL (ref 150–400)
RBC: 4.19 MIL/uL (ref 3.87–5.11)
RDW: 13.2 % (ref 11.5–15.5)
WBC Count: 4.6 10*3/uL (ref 4.0–10.5)
nRBC: 0 % (ref 0.0–0.2)

## 2024-01-02 LAB — CMP (CANCER CENTER ONLY)
ALT: 17 U/L (ref 0–44)
AST: 19 U/L (ref 15–41)
Albumin: 4.3 g/dL (ref 3.5–5.0)
Alkaline Phosphatase: 58 U/L (ref 38–126)
Anion gap: 7 (ref 5–15)
BUN: 16 mg/dL (ref 8–23)
CO2: 26 mmol/L (ref 22–32)
Calcium: 9.5 mg/dL (ref 8.9–10.3)
Chloride: 104 mmol/L (ref 98–111)
Creatinine: 0.67 mg/dL (ref 0.44–1.00)
GFR, Estimated: >60 mL/min (ref >60)
Glucose, Bld: 93 mg/dL (ref 70–99)
Potassium: 4.6 mmol/L (ref 3.5–5.1)
Sodium: 137 mmol/L (ref 135–145)
Total Bilirubin: 0.6 mg/dL (ref 0.0–1.2)
Total Protein: 7.5 g/dL (ref 6.5–8.1)

## 2024-01-02 LAB — VITAMIN D 25 HYDROXY (VIT D DEFICIENCY, FRACTURES): Vit D, 25-Hydroxy: 43.84 ng/mL (ref 30–100)

## 2024-01-02 MED ORDER — LETROZOLE 2.5 MG PO TABS
2.5000 mg | ORAL_TABLET | Freq: Every day | ORAL | 3 refills | Status: AC
Start: 2024-01-02 — End: ?

## 2024-01-02 NOTE — Progress Notes (Signed)
 [facility] CONSULT NOTE  Patient Care Team: Tower, Manley Seeds, MD as PCP - General Glenis Langdon, MD as Referring Physician (Radiation Oncology) Burnie Cartwright, RN as Oncology Nurse Navigator Eldred Grego, MD as Consulting Physician (General Surgery) Gwyn Leos, MD as Consulting Physician (Internal Medicine)  CHIEF COMPLAINTS/PURPOSE OF CONSULTATION: Breast cancer  #  Oncology History Overview Note  # LEFT BREAST CA- INVASIVE MAMMARY CARCINOMA, NO SPECIAL TYPE, WITH MICROPAPILLARY  FEATURES; ER/PR Positive [90%]; Her 2 NEG; G-1; 0.8 x 0.7 x 1 cm.  Oncotype recurrence score= 0; 3% distant recurrence; no chemotherapy; adjuvant radiation;  s/p RT [nov 5th, 2021];   # NOV end 221- START Anastrazole[previous HRT*]; JUNE 16th, 2023- Discontinue anastrozole ; switch over to exemestane  given patient's MSK side effects  # # SURVIVORSHIP:   # GENETICS: No pathogenic variants; VUS*  DIAGNOSIS: Cure  STAGE: 1        ;  GOALS: Cure  CURRENT/MOST RECENT THERAPY :     Carcinoma of upper-inner quadrant of left breast in female, estrogen receptor positive (HCC)  03/01/2020 Initial Diagnosis   Carcinoma of upper-inner quadrant of left breast in female, estrogen receptor positive (HCC)    Genetic Testing   Negative genetic testing. No pathogenic variants identified on the [facility] Multi-Cancer Panel. VUS in AIP called c.790_792del and VUS in RET called c.1163T>C identified. The report date is 04/07/2020.  The Multi-Cancer Panel offered by [facility] includes sequencing and/or deletion duplication testing of the following 85 genes: AIP, ALK, APC, ATM, AXIN2,BAP1,  BARD1, BLM, BMPR1A, BRCA1, BRCA2, BRIP1, CASR, CDC73, CDH1, CDK4, CDKN1B, CDKN1C, CDKN2A (p14ARF), CDKN2A (p16INK4a), CEBPA, CHEK2, CTNNA1, DICER1, DIS3L2, EGFR (c.2369C>T, p.Thr790Met variant only), EPCAM (Deletion/duplication testing only), FH, FLCN, GATA2, GPC3, GREM1 (Promoter region deletion/duplication  testing only), HOXB13 (c.251G>A, p.Gly84Glu), HRAS, KIT, MAX, MEN1, MET, MITF (c.952G>A, p.Glu318Lys variant only), MLH1, MSH2, MSH3, MSH6, MUTYH, NBN, NF1, NF2, NTHL1, PALB2, PDGFRA, PHOX2B, PMS2, POLD1, POLE, POT1, PRKAR1A, PTCH1, PTEN, RAD50, RAD51C, RAD51D, RB1, RECQL4, RET, RNF43, RUNX1, SDHAF2, SDHA (sequence changes only), SDHB, SDHC, SDHD, SMAD4, SMARCA4, SMARCB1, SMARCE1, STK11, SUFU, TERC, TERT, TMEM127, TP53, TSC1, TSC2, VHL, WRN and WT1.     HISTORY OF PRESENTING ILLNESS: Patient ambulating-independently.   Alone.  Brittney Johnson 68 y.o.  female diagnosis of stage I ER/PR positive HER-2 negative breast cancer on adjuvant letrozole  is here for follow-up.   Patient denies any major side effects from letrozole . Energy is good. Appetite is normal.  She continues to be compliant with vitamin D .  Denies any nausea vomiting abdominal pain.  No fever no chills.  Review of Systems  Constitutional:  Negative for chills, diaphoresis, fever, malaise/fatigue and weight loss.  HENT:  Negative for nosebleeds and sore throat.   Eyes:  Negative for double vision.  Respiratory:  Negative for cough, hemoptysis, sputum production, shortness of breath and wheezing.   Cardiovascular:  Negative for chest pain, palpitations, orthopnea and leg swelling.  Gastrointestinal:  Negative for abdominal pain, blood in stool, constipation, diarrhea, heartburn, melena, nausea and vomiting.  Genitourinary:  Negative for dysuria, frequency and urgency.  Musculoskeletal:  Positive for joint pain and myalgias. Negative for back pain.  Skin: Negative.  Negative for itching and rash.  Neurological:  Negative for dizziness, tingling, focal weakness, weakness and headaches.  Endo/Heme/Allergies:  Does not bruise/bleed easily.  Psychiatric/Behavioral:  Negative for depression. The patient is not nervous/anxious and does not have insomnia.      MEDICAL HISTORY:  Past Medical History:  Diagnosis  Date   Actinic  keratosis    Atypical pneumonia 1998   not hosp   Breast cancer (HCC) 2021   left-sx, radation completed   DDD (degenerative disc disease), cervical    Family history of breast cancer    Family history of lung cancer    Hyperlipidemia    Hypertension    Low back pain    on and off   PONV (postoperative nausea and vomiting)    due to some sedations!   Syncope and collapse 07/16/2007    SURGICAL HISTORY: Past Surgical History:  Procedure Laterality Date   ABDOMINAL HYSTERECTOMY     supracervical- L ovary remains   BREAST BIOPSY Left 02/22/2020   Q clip us  bx, [facility]   CARDIOVASCULAR STRESS TEST     08/28/06 normal    COLONOSCOPY  08/13/2016   Pyrtle   left hand surgery Left 2016   Dupuytrens   LUMBAR DISC SURGERY  08/2017   Lamon Pillow   OOPHORECTOMY Right    Right ovary removed    PARTIAL MASTECTOMY WITH NEEDLE LOCALIZATION AND AXILLARY SENTINEL LYMPH NODE BX Left 03/15/2020   Procedure: PARTIAL MASTECTOMY WITH RF TAG PLACEMENT AND AXILLARY SENTINEL LYMPH NODE BX;  Surgeon: Eldred Grego, MD;  Location: [facility] ORS;  Service: General;  Laterality: Left;   POLYPECTOMY     TUBAL LIGATION     ? 1979    SOCIAL HISTORY: Social History   Socioeconomic History   Marital status: Married    Spouse name: Not on file   Number of children: Not on file   Years of education: Not on file   Highest education level: Not on file  Occupational History   Occupation: Production designer, theatre/television/film: [employer]  Tobacco Use   Smoking status: Former    Current packs/day: 0.00    Average packs/day: 0.5 packs/day for 5.0 years (2.5 ttl pk-yrs)    Types: Cigarettes    Start date: 07/29/1992    Quit date: 07/29/1997    Years since quitting: 26.4   Smokeless tobacco: Never  Vaping Use   Vaping status: Never Used  Substance and Sexual Activity   Alcohol use: Yes    Alcohol/week: 1.0 standard drink of alcohol    Types: 1 Glasses of wine per week   Drug use: No   Sexual activity:  Yes  Other Topics Concern   Not on file  Social History Narrative   Kids live close by, 3 grandkids- really enjoys, runs for exercise, ran a 5k March 2011; walks; quit 20 years; rare alcohol. Work for [employer]; lives in [place]. 2 boys- 40s.    Social Drivers of Corporate investment banker Strain: Not on file  Food Insecurity: Not on file  Transportation Needs: Not on file  Physical Activity: Not on file  Stress: Not on file  Social Connections: Not on file  Intimate Partner Violence: Not on file    FAMILY HISTORY: Family History  Problem Relation Age of Onset   Lung cancer Father 20   Hyperlipidemia Mother    Diabetes Mother    Hypertension Mother    Stroke Mother    Heart disease Other    Lung cancer Other        uncle   Diabetes Other        aunt   Coronary artery disease Maternal Grandfather    Breast cancer Maternal Aunt        dx under 50   Lung cancer  Maternal Uncle    Lung cancer Paternal Uncle    Cancer Cousin        unk types   Cancer Cousin        unk types   Thyroid  disease Neg Hx    Colon cancer Neg Hx    Colon polyps Neg Hx    Esophageal cancer Neg Hx    Rectal cancer Neg Hx    Stomach cancer Neg Hx     ALLERGIES:  is allergic to codeine, bee venom, and atorvastatin .  MEDICATIONS:  Current Outpatient Medications  Medication Sig Dispense Refill   amLODipine  (NORVASC ) 5 MG tablet TAKE 1 TABLET DAILY 90 tablet 3   Calcium  Citrate-Vitamin D  (CALCIUM  + D PO) Take by mouth daily.     cholecalciferol (VITAMIN D3) 25 MCG (1000 UNIT) tablet Take 2,000 Units by mouth daily.     cyanocobalamin  (VITAMIN B12) 1000 MCG tablet Take 1,000 mcg by mouth daily.     lisinopril  (ZESTRIL ) 10 MG tablet TAKE 1 TABLET DAILY 90 tablet 3   Omega-3 Fatty Acids (FISH OIL) 1200 MG CAPS Take 1,200 mg by mouth daily.     rosuvastatin  (CRESTOR ) 5 MG tablet TAKE 1 TABLET DAILY 90 tablet 3   letrozole  (FEMARA ) 2.5 MG tablet Take 1 tablet (2.5 mg total) by mouth daily. 90  tablet 3   No current facility-administered medications for this visit.      Aaron Aas  PHYSICAL EXAMINATION: ECOG PERFORMANCE STATUS: 0 - Asymptomatic  Vitals:   01/02/24 1327  BP: 135/74  Pulse: 66  Resp: 16  Temp: 98.3 F (36.8 C)  SpO2: 99%   Filed Weights   01/02/24 1327  Weight: 163 lb 8 oz (74.2 kg)    Physical Exam HENT:     Head: Normocephalic and atraumatic.     Mouth/Throat:     Pharynx: No oropharyngeal exudate.  Eyes:     Pupils: Pupils are equal, round, and reactive to light.  Cardiovascular:     Rate and Rhythm: Normal rate and regular rhythm.  Pulmonary:     Effort: Pulmonary effort is normal. No respiratory distress.     Breath sounds: Normal breath sounds. No wheezing.  Abdominal:     General: Bowel sounds are normal. There is no distension.     Palpations: Abdomen is soft. There is no mass.     Tenderness: There is no abdominal tenderness. There is no guarding or rebound.  Musculoskeletal:        General: No tenderness. Normal range of motion.     Cervical back: Normal range of motion and neck supple.  Skin:    General: Skin is warm.  Neurological:     Mental Status: She is alert and oriented to person, place, and time.  Psychiatric:        Mood and Affect: Affect normal.    LABORATORY DATA:  I have reviewed the data as listed Lab Results  Component Value Date   WBC 4.6 01/02/2024   HGB 12.1 01/02/2024   HCT 37.0 01/02/2024   MCV 88.3 01/02/2024   PLT 200 01/02/2024   Recent Labs    04/21/23 0832 07/03/23 1009 01/02/24 1331  NA 139 138 137  K 4.2 4.3 4.6  CL 103 104 104  CO2 27 28 26   GLUCOSE 96 92 93  BUN 15 16 16   CREATININE 0.73 0.72 0.67  CALCIUM  9.7 9.5 9.5  GFRNONAA  --  >60 >60  PROT 6.9 7.4 7.5  ALBUMIN  4.4 4.4 4.3  AST 15 16 19   ALT 13 17 17   ALKPHOS 54 59 58  BILITOT 0.5 0.5 0.6    RADIOGRAPHIC STUDIES: I have personally reviewed the radiological images as listed and agreed with the findings in the report. No  results found.  ASSESSMENT & PLAN:   Carcinoma of upper-inner quadrant of left breast in female, estrogen receptor positive (HCC) #Left breast cancer stage I ER/PR positive HER-2 negative; s/p lumpectomy; on adjuvant letrozole  until 2026-fall ] mammogram Oct 2024 -Dr. Charmel Cooter. Stable.   # On letrozole  tolerating  fairly well.  Again reviewed that she will continue until fall 2026. Refilled.   # Osteopenia-reviewed OCT 2022- T score+ -1.4-osteopenia; DEC 2023-  T-score of -1.5. Given the stability of the bone density recommend continued monitoring. Patient physically active/exercising.  continue  MVT- Ca 1200 mg+ 1000+  vit D 2000 mg/day.   MARCH 2024- vit D 61.  Will repeat BMD in 2025.   mychart # DISPOSITION: # Follow-up in 6 months-MD; cbc/cmp; vit D 25-OH levels; BMD prior---  Dr. Alphonzo Ask, MD 01/02/2024 2:19 PM

## 2024-01-02 NOTE — Assessment & Plan Note (Signed)
#  Left breast cancer stage I ER/PR positive HER-2 negative; s/p lumpectomy; on adjuvant letrozole  until 2026-fall ] mammogram Oct 2024 -Dr. Charmel Cooter. Stable.   # On letrozole  tolerating  fairly well.  Again reviewed that she will continue until fall 2026. Refilled.   # Osteopenia-reviewed OCT 2022- T score+ -1.4-osteopenia; DEC 2023-  T-score of -1.5. Given the stability of the bone density recommend continued monitoring. Patient physically active/exercising.  continue  MVT- Ca 1200 mg+ 1000+  vit D 2000 mg/day.   MARCH 2024- vit D 61.  Will repeat BMD in 2025.   mychart # DISPOSITION: # Follow-up in 6 months-MD; cbc/cmp; vit D 25-OH levels; BMD prior---  Dr. Geraldene Kleine.

## 2024-01-02 NOTE — Progress Notes (Signed)
 No concerns today

## 2024-03-05 ENCOUNTER — Other Ambulatory Visit: Payer: Self-pay | Admitting: *Deleted

## 2024-03-05 DIAGNOSIS — C50212 Malignant neoplasm of upper-inner quadrant of left female breast: Secondary | ICD-10-CM

## 2024-03-05 DIAGNOSIS — M810 Age-related osteoporosis without current pathological fracture: Secondary | ICD-10-CM

## 2024-03-05 DIAGNOSIS — M8580 Other specified disorders of bone density and structure, unspecified site: Secondary | ICD-10-CM

## 2024-03-22 ENCOUNTER — Other Ambulatory Visit: Payer: Self-pay | Admitting: General Surgery

## 2024-03-22 DIAGNOSIS — Z1231 Encounter for screening mammogram for malignant neoplasm of breast: Secondary | ICD-10-CM

## 2024-03-26 ENCOUNTER — Ambulatory Visit: Payer: Self-pay

## 2024-03-26 DIAGNOSIS — W57XXXA Bitten or stung by nonvenomous insect and other nonvenomous arthropods, initial encounter: Secondary | ICD-10-CM | POA: Diagnosis not present

## 2024-03-26 DIAGNOSIS — R2232 Localized swelling, mass and lump, left upper limb: Secondary | ICD-10-CM | POA: Diagnosis not present

## 2024-03-26 NOTE — Telephone Encounter (Signed)
 FYI Only or Action Required?: FYI only for provider.  Patient was last seen in primary care on 04/28/2023 by Randeen Laine LABOR, MD.  Called Nurse Triage reporting Insect Bite.  Symptoms began yesterday.  Interventions attempted: Nothing.  Symptoms are: gradually worsening.  Triage Disposition: See HCP Within 4 Hours (Or PCP Triage)  Patient/caregiver understands and will follow disposition?: Yes  To UC   Copied from CRM #[id number]. Topic: Clinical - Red Word Triage >> Mar 26, 2024  8:25 AM Brittney Johnson wrote: Red Word that prompted transfer to Nurse Triage: Patient called in regarding a insect bite, swollen from wrist to elbow and hot to the touch    ----------------------------------------------------------------------- From previous Reason for Contact - Scheduling: Patient/patient representative is calling to schedule an appointment. Refer to attachments for appointment information. Reason for Disposition  [1] SEVERE bite pain AND [2] not improved after 2 hours of pain medicine  Answer Assessment - Initial Assessment Questions 1. TYPE of INSECT: What type of insect was it?      unknown 2. ONSET: When did you get bitten?      Last night 3. LOCATION: Where is the insect bite located?      Left arm swollen from wrist to elbow, hx breast ca on left side 4. REDNESS: Is the area red or pink? If Yes, ask: What size is the area of redness? (inches or cm). When did the redness start?     Red and warm to touch 5. PAIN: Is there any pain? If Yes, ask: How bad is the pain? (Scale 0-10; or none, mild, moderate, severe)     moderate 6. ITCHING: Does it itch? If Yes, ask: How bad is the itch?      no 7. SWELLING: How big is the swelling? (e.g., inches, cm, or compare to coins)     Very swollen 8. OTHER SYMPTOMS: Do you have any other symptoms?  (e.g., difficulty breathing, fever, hives)     denies 9. PREGNANCY: Is there any chance you are pregnant? When was your  last menstrual period?     na  Protocols used: Insect Bite-A-AH

## 2024-03-26 NOTE — Telephone Encounter (Signed)
 Aware, will watch for correspondence Agree with disposition

## 2024-04-18 ENCOUNTER — Telehealth: Payer: Self-pay | Admitting: Family Medicine

## 2024-04-18 DIAGNOSIS — I10 Essential (primary) hypertension: Secondary | ICD-10-CM

## 2024-04-18 DIAGNOSIS — E7800 Pure hypercholesterolemia, unspecified: Secondary | ICD-10-CM

## 2024-04-18 DIAGNOSIS — E049 Nontoxic goiter, unspecified: Secondary | ICD-10-CM

## 2024-04-18 DIAGNOSIS — E559 Vitamin D deficiency, unspecified: Secondary | ICD-10-CM

## 2024-04-18 NOTE — Telephone Encounter (Signed)
-----   Message from Veva JINNY Ferrari sent at 04/08/2024 11:54 AM EDT ----- Regarding: Lab orders forTue, 9.23.25 Patient is scheduled for CPX labs, please order future labs, Thanks , Veva

## 2024-04-20 ENCOUNTER — Other Ambulatory Visit (INDEPENDENT_AMBULATORY_CARE_PROVIDER_SITE_OTHER): Payer: BC Managed Care – PPO

## 2024-04-20 ENCOUNTER — Ambulatory Visit: Payer: Self-pay | Admitting: Family Medicine

## 2024-04-20 DIAGNOSIS — E559 Vitamin D deficiency, unspecified: Secondary | ICD-10-CM | POA: Diagnosis not present

## 2024-04-20 DIAGNOSIS — E7800 Pure hypercholesterolemia, unspecified: Secondary | ICD-10-CM | POA: Diagnosis not present

## 2024-04-20 DIAGNOSIS — I10 Essential (primary) hypertension: Secondary | ICD-10-CM

## 2024-04-20 LAB — CBC WITH DIFFERENTIAL/PLATELET
Basophils Absolute: 0 K/uL (ref 0.0–0.1)
Basophils Relative: 1.2 % (ref 0.0–3.0)
Eosinophils Absolute: 0.2 K/uL (ref 0.0–0.7)
Eosinophils Relative: 4.7 % (ref 0.0–5.0)
HCT: 37.3 % (ref 36.0–46.0)
Hemoglobin: 12.4 g/dL (ref 12.0–15.0)
Lymphocytes Relative: 35 % (ref 12.0–46.0)
Lymphs Abs: 1.3 K/uL (ref 0.7–4.0)
MCHC: 33.1 g/dL (ref 30.0–36.0)
MCV: 86.6 fl (ref 78.0–100.0)
Monocytes Absolute: 0.5 K/uL (ref 0.1–1.0)
Monocytes Relative: 11.9 % (ref 3.0–12.0)
Neutro Abs: 1.8 K/uL (ref 1.4–7.7)
Neutrophils Relative %: 47.2 % (ref 43.0–77.0)
Platelets: 215 K/uL (ref 150.0–400.0)
RBC: 4.31 Mil/uL (ref 3.87–5.11)
RDW: 13.9 % (ref 11.5–15.5)
WBC: 3.8 K/uL — ABNORMAL LOW (ref 4.0–10.5)

## 2024-04-20 LAB — COMPREHENSIVE METABOLIC PANEL WITH GFR
ALT: 15 U/L (ref 0–35)
AST: 16 U/L (ref 0–37)
Albumin: 4.5 g/dL (ref 3.5–5.2)
Alkaline Phosphatase: 58 U/L (ref 39–117)
BUN: 13 mg/dL (ref 6–23)
CO2: 29 meq/L (ref 19–32)
Calcium: 9.8 mg/dL (ref 8.4–10.5)
Chloride: 103 meq/L (ref 96–112)
Creatinine, Ser: 0.74 mg/dL (ref 0.40–1.20)
GFR: 81.93 mL/min (ref >60.00)
Glucose, Bld: 96 mg/dL (ref 70–99)
Potassium: 4.4 meq/L (ref 3.5–5.1)
Sodium: 140 meq/L (ref 135–145)
Total Bilirubin: 0.4 mg/dL (ref 0.2–1.2)
Total Protein: 7.1 g/dL (ref 6.0–8.3)

## 2024-04-20 LAB — LIPID PANEL
Cholesterol: 226 mg/dL — ABNORMAL HIGH (ref 0–200)
HDL: 64.1 mg/dL (ref >39.00)
LDL Cholesterol: 131 mg/dL — ABNORMAL HIGH (ref 0–99)
NonHDL: 162.03
Total CHOL/HDL Ratio: 4
Triglycerides: 154 mg/dL — ABNORMAL HIGH (ref 0.0–149.0)
VLDL: 30.8 mg/dL (ref 0.0–40.0)

## 2024-04-20 LAB — TSH: TSH: 2.39 u[IU]/mL (ref 0.35–5.50)

## 2024-04-20 LAB — VITAMIN D 25 HYDROXY (VIT D DEFICIENCY, FRACTURES): VITD: 33.42 ng/mL (ref 30.00–100.00)

## 2024-04-21 ENCOUNTER — Encounter: Payer: Self-pay | Admitting: Gastroenterology

## 2024-04-27 ENCOUNTER — Encounter: Payer: Self-pay | Admitting: Family Medicine

## 2024-04-27 ENCOUNTER — Ambulatory Visit (INDEPENDENT_AMBULATORY_CARE_PROVIDER_SITE_OTHER): Payer: BC Managed Care – PPO | Admitting: Family Medicine

## 2024-04-27 VITALS — BP 121/70 | HR 66 | Temp 98.5°F | Ht 64.0 in | Wt 162.4 lb

## 2024-04-27 DIAGNOSIS — Z0000 Encounter for general adult medical examination without abnormal findings: Secondary | ICD-10-CM

## 2024-04-27 DIAGNOSIS — E7800 Pure hypercholesterolemia, unspecified: Secondary | ICD-10-CM

## 2024-04-27 DIAGNOSIS — E559 Vitamin D deficiency, unspecified: Secondary | ICD-10-CM | POA: Diagnosis not present

## 2024-04-27 DIAGNOSIS — Z23 Encounter for immunization: Secondary | ICD-10-CM | POA: Diagnosis not present

## 2024-04-27 DIAGNOSIS — M85859 Other specified disorders of bone density and structure, unspecified thigh: Secondary | ICD-10-CM | POA: Diagnosis not present

## 2024-04-27 DIAGNOSIS — E2839 Other primary ovarian failure: Secondary | ICD-10-CM

## 2024-04-27 DIAGNOSIS — I10 Essential (primary) hypertension: Secondary | ICD-10-CM

## 2024-04-27 NOTE — Assessment & Plan Note (Signed)
 Disc goals for lipids and reasons to control them Rev last labs with pt Rev low sat fat diet in detail LDL up to 131 Eating worse-is back on track now  Crestor  5 mg -may have to increase in future  Prevention atorvastatin  20 caused leg pain   Plan to re check lipids in 3 mo with better diet

## 2024-04-27 NOTE — Assessment & Plan Note (Signed)
 Last vitamin D  Lab Results  Component Value Date   VD25OH 33.42 04/20/2024   Vitamin D  level is therapeutic with current supplementation Disc importance of this to bone and overall health

## 2024-04-27 NOTE — Assessment & Plan Note (Addendum)
 Reviewed health habits including diet and exercise and skin cancer prevention Reviewed appropriate screening tests for age  Also reviewed health mt list, fam hx and immunization status , as well as social and family history   See HPI Labs reviewed and ordered  Health Maintenance  Topic Date Due   Medicare Annual Wellness Visit  Never done   Hepatitis C Screening  Never done   Colon Cancer Screening  09/12/2023   Breast Cancer Screening  05/04/2024   COVID-19 Vaccine (4 - 2025-26 season) 05/13/2025*   Zoster (Shingles) Vaccine (1 of 2) 07/27/2025*   DTaP/Tdap/Td vaccine (3 - Td or Tdap) 01/05/2030   Pneumococcal Vaccine for age over 36  Completed   Flu Shot  Completed   DEXA scan (bone density measurement)  Completed   HPV Vaccine  Aged Out   Meningitis B Vaccine  Aged Out  *Topic was postponed. The date shown is not the original due date.    Flu shot today  Declines shingrix  Mammo scheduled  Dexa ordered  Discussed fall prevention, supplements and exercise for bone density  Colonoscopy scheduled in nov No falls or fractures  PHq 0

## 2024-04-27 NOTE — Assessment & Plan Note (Addendum)
 Due for dexa in December Ordered  Letrozole  one more year  Discussed fall prevention, supplements and exercise for bone density  No falls or fractures  D level in normal range

## 2024-04-27 NOTE — Progress Notes (Signed)
 Subjective:    Patient ID: Brittney Johnson, female    DOB: 1955/08/19, 68 y.o.   MRN: [id number]  HPI  Here for health maintenance exam and to review chronic medical problems   Wt Readings from Last 3 Encounters:  04/27/24 162 lb 6 oz (73.7 kg)  01/02/24 163 lb 8 oz (74.2 kg)  10/23/23 160 lb (72.6 kg)   27.87 kg/m  Vitals:   04/27/24 0803 04/27/24 0824  BP: 134/78 121/70  Pulse: 66   Temp: 98.5 F (36.9 C)   SpO2: 97%     Immunization History  Administered Date(s) Administered   Fluad Quad(high Dose 65+) 04/19/2019, 05/24/2020, 04/23/2021, 04/25/2022   Fluad Trivalent(High Dose 65+) 04/28/2023   INFLUENZA, HIGH DOSE SEASONAL PF 04/27/2024   Influenza Split 05/30/2011, 04/28/2012   Influenza Whole 04/19/2010   Influenza,inj,Quad PF,6+ Mos 04/20/2013, 05/11/2014, 04/20/2015, 04/09/2016, 04/11/2017, 04/13/2018   PFIZER(Purple Top)SARS-COV-2 Vaccination 10/04/2019, 10/27/2019, 07/10/2020   Pneumococcal Conjugate-13 04/19/2019   Pneumococcal Polysaccharide-23 07/29/2005, 04/19/2020   Tdap 12/05/2010, 01/06/2020   Zoster, Live 05/23/2015    Health Maintenance Due  Topic Date Due   Medicare Annual Wellness (AWV)  Never done   Hepatitis C Screening  Never done   Colonoscopy  09/12/2023   Mammogram  05/04/2024   Flu shot - today Declines shingrix   Mammogram 04/2023 -has it scheduled oct 7 Self breast exam-normal/ no masses  Personal history of breast cancer -specialist does her exams   Gyn health Supracervical hysterectomy   Colon cancer screening - has a colonoscopy schedules nov 20th  Colonoscopy 08/2020 3 y recall  Bone health  Dexa 06/2022  On letrozole  - one more year  Osteopenia  Falls-none  Fractures-none  Supplements ca and D  Last vitamin D  Lab Results  Component Value Date   VD25OH 33.42 04/20/2024    Exercise  Walks every day 1.5 miles  Going to join the gym for a medicare plan      Mood    04/27/2024    8:06 AM 04/28/2023     8:18 AM 04/25/2022    8:42 AM 04/23/2021   12:07 PM 04/19/2020    9:15 AM  Depression screen PHQ 2/9  Decreased Interest 0 0 0 0 0  Down, Depressed, Hopeless 0 0 0 0 0  PHQ - 2 Score 0 0 0 0 0  Altered sleeping 0 0 0 0 0  Tired, decreased energy 0 0 1 0 0  Change in appetite 0 0 0 0 1  Feeling bad or failure about yourself  0 0 0 0 0  Trouble concentrating 0 0 0 0 0  Moving slowly or fidgety/restless 0 0 0 0 0  Suicidal thoughts 0 0 0 0 0  PHQ-9 Score 0 0 1 0 1  Difficult doing work/chores Not difficult at all Not difficult at all Not difficult at all Not difficult at all Not difficult at all   HTN bp is stable today  No cp or palpitations or headaches or edema  No side effects to medicines  BP Readings from Last 3 Encounters:  04/27/24 121/70  01/02/24 135/74  10/23/23 135/74   Amlodipine  5 mg daily  Lisinopril  10 mg daily  Lab Results  Component Value Date   NA 140 04/20/2024   K 4.4 04/20/2024   CO2 29 04/20/2024   GLUCOSE 96 04/20/2024   BUN 13 04/20/2024   CREATININE 0.74 04/20/2024   CALCIUM  9.8 04/20/2024   GFR 81.93 04/20/2024  GFRNONAA >60 01/02/2024   Hyperlipidemia Lab Results  Component Value Date   CHOL 226 (H) 04/20/2024   CHOL 193 04/21/2023   CHOL 195 07/15/2022   Lab Results  Component Value Date   HDL 64.10 04/20/2024   HDL 65.70 04/21/2023   HDL 62.70 07/15/2022   Lab Results  Component Value Date   LDLCALC 131 (H) 04/20/2024   LDLCALC 103 (H) 04/21/2023   LDLCALC 100 (H) 07/15/2022   Lab Results  Component Value Date   TRIG 154.0 (H) 04/20/2024   TRIG 121.0 04/21/2023   TRIG 163.0 (H) 07/15/2022   Lab Results  Component Value Date   CHOLHDL 4 04/20/2024   CHOLHDL 3 04/21/2023   CHOLHDL 3 07/15/2022   Lab Results  Component Value Date   LDLDIRECT 160.9 03/02/2013   LDLDIRECT 152.1 02/03/2012   LDLDIRECT 128.7 05/31/2011   Atorvastatin  20 mg daily -changed to crestor  5 mg  Does better with it in terms of leg pain   Diet  is out of whack lately -in process of multiple care giving and renovating her house   Fast food twice weekly  Not watching that   Just got back on track  Stopped fried foods More greens Started back on daily metamucil  Beef once per week   Lab Results  Component Value Date   ALT 15 04/20/2024   AST 16 04/20/2024   ALKPHOS 58 04/20/2024   BILITOT 0.4 04/20/2024    Lab Results  Component Value Date   WBC 3.8 (L) 04/20/2024   HGB 12.4 04/20/2024   HCT 37.3 04/20/2024   MCV 86.6 04/20/2024   PLT 215.0 04/20/2024   Lab Results  Component Value Date   TSH 2.39 04/20/2024       Patient Active Problem List   Diagnosis Date Noted   Pre-operative clearance 04/28/2023   Left knee pain 02/13/2023   Genetic testing 04/10/2020   Family history of breast cancer    Family history of lung cancer    Carcinoma of upper-inner quadrant of left breast in female, estrogen receptor positive (HCC) 03/01/2020   Osteopenia 05/15/2019   Estrogen deficiency 04/19/2019   Nocturnal leg cramps 04/13/2018   Colon cancer screening 04/09/2016   Vitamin D  deficiency 04/09/2016   Enlarged thyroid  12/05/2015   Contracture of palmar fascia 07/10/2015   Encounter for routine gynecological examination 02/20/2015   Near syncope 08/05/2014   Abnormal EKG 08/05/2014   Anxiety 08/05/2014   H/O cold sores 06/06/2014   Dupuytren's contracture of right hand 02/08/2014   Routine general medical examination at a health care facility 12/05/2010   Raynaud disease 12/05/2010   Hyperlipidemia 06/29/2010   HYPERTENSION, BENIGN ESSENTIAL 06/29/2010   Lumbar back pain with radiculopathy affecting right lower extremity 06/29/2010   Past Medical History:  Diagnosis Date   Actinic keratosis    Atypical pneumonia 1998   not hosp   Breast cancer (HCC) 2021   left-sx, radation completed   DDD (degenerative disc disease), cervical    Family history of breast cancer    Family history of lung cancer     Hyperlipidemia    Hypertension    Low back pain    on and off   PONV (postoperative nausea and vomiting)    due to some sedations!   Syncope and collapse 07/16/2007   Past Surgical History:  Procedure Laterality Date   ABDOMINAL HYSTERECTOMY     supracervical- L ovary remains   BREAST BIOPSY Left 02/22/2020  Q clip us  bx, [facility]   CARDIOVASCULAR STRESS TEST     08/28/06 normal    COLONOSCOPY  08/13/2016   Pyrtle   left hand surgery Left 2016   Dupuytrens   LUMBAR DISC SURGERY  08/2017   On[facility]a   OOPHORECTOMY Right    Right ovary removed    PARTIAL MASTECTOMY WITH NEEDLE LOCALIZATION AND AXILLARY SENTINEL LYMPH NODE BX Left 03/15/2020   Procedure: PARTIAL MASTECTOMY WITH RF TAG PLACEMENT AND AXILLARY SENTINEL LYMPH NODE BX;  Surgeon: [name], MD;  Location: [facility] ORS;  Service: General;  Laterality: Left;   POLYPECTOMY     TUBAL LIGATION     ? 1979   Social History   Tobacco Use   Smoking status: Former    Current packs/day: 0.00    Average packs/day: 0.5 packs/day for 5.0 years (2.5 ttl pk-yrs)    Types: Cigarettes    Start date: 07/29/1992    Quit date: 07/29/1997    Years since quitting: 26.7   Smokeless tobacco: Never  Vaping Use   Vaping status: Never Used  Substance Use Topics   Alcohol use: Yes    Alcohol/week: 1.0 standard drink of alcohol    Types: 1 Glasses of wine per week   Drug use: No   Family History  Problem Relation Age of Onset   Lung cancer Father 46   Hyperlipidemia Mother    Diabetes Mother    Hypertension Mother    Stroke Mother    Heart disease Other    Lung cancer Other        uncle   Diabetes Other        aunt   Coronary artery disease Maternal Grandfather    Breast cancer Maternal Aunt        dx under 50   Lung cancer Maternal Uncle    Lung cancer Paternal Uncle    Cancer Cousin        unk types   Cancer Cousin        unk types   Thyroid  disease Neg Hx    Colon cancer Neg Hx    Colon polyps Neg Hx    Esophageal  cancer Neg Hx    Rectal cancer Neg Hx    Stomach cancer Neg Hx    Allergies  Allergen Reactions   Codeine     REACTION: passed out   Bee Venom Swelling   Atorvastatin      Leg pain    Magnesium Sulfate-Potassium Cl-Sodium Sulfate Dermatitis and Nausea Only   Current Outpatient Medications on File Prior to Visit  Medication Sig Dispense Refill   amLODipine  (NORVASC ) 5 MG tablet TAKE 1 TABLET DAILY 90 tablet 3   Calcium  Citrate-Vitamin D  (CALCIUM  + D PO) Take by mouth daily.     cholecalciferol (VITAMIN D3) 25 MCG (1000 UNIT) tablet Take 2,000 Units by mouth daily.     cyanocobalamin  (VITAMIN B12) 1000 MCG tablet Take 1,000 mcg by mouth daily.     letrozole  (FEMARA ) 2.5 MG tablet Take 1 tablet (2.5 mg total) by mouth daily. 90 tablet 3   lisinopril  (ZESTRIL ) 10 MG tablet TAKE 1 TABLET DAILY 90 tablet 3   Omega-3 Fatty Acids (FISH OIL) 1200 MG CAPS Take 1,200 mg by mouth daily.     rosuvastatin  (CRESTOR ) 5 MG tablet TAKE 1 TABLET DAILY 90 tablet 3   No current facility-administered medications on file prior to visit.    Review of Systems  Constitutional:  Negative for activity  change, appetite change, fatigue, fever and unexpected weight change.  HENT:  Negative for congestion, ear pain, rhinorrhea, sinus pressure and sore throat.   Eyes:  Negative for pain, redness and visual disturbance.  Respiratory:  Negative for cough, shortness of breath and wheezing.   Cardiovascular:  Negative for chest pain and palpitations.  Gastrointestinal:  Negative for abdominal pain, blood in stool, constipation and diarrhea.  Endocrine: Negative for polydipsia and polyuria.  Genitourinary:  Negative for dysuria, frequency and urgency.  Musculoskeletal:  Negative for arthralgias, back pain and myalgias.  Skin:  Negative for pallor and rash.  Allergic/Immunologic: Negative for environmental allergies.  Neurological:  Negative for dizziness, syncope and headaches.  Hematological:  Negative for  adenopathy. Does not bruise/bleed easily.  Psychiatric/Behavioral:  Negative for decreased concentration and dysphoric mood. The patient is not nervous/anxious.        Objective:   Physical Exam Constitutional:      General: She is not in acute distress.    Appearance: Normal appearance. She is well-developed and normal weight. She is not ill-appearing or diaphoretic.  HENT:     Head: Normocephalic and atraumatic.     Right Ear: Tympanic membrane, ear canal and external ear normal.     Left Ear: Tympanic membrane, ear canal and external ear normal.     Nose: Nose normal. No congestion.     Mouth/Throat:     Mouth: Mucous membranes are moist.     Pharynx: Oropharynx is clear. No posterior oropharyngeal erythema.  Eyes:     General: No scleral icterus.    Extraocular Movements: Extraocular movements intact.     Conjunctiva/sclera: Conjunctivae normal.     Pupils: Pupils are equal, round, and reactive to light.  Neck:     Thyroid : No thyromegaly.     Vascular: No carotid bruit or JVD.  Cardiovascular:     Rate and Rhythm: Normal rate and regular rhythm.     Pulses: Normal pulses.     Heart sounds: Normal heart sounds.     No gallop.  Pulmonary:     Effort: Pulmonary effort is normal. No respiratory distress.     Breath sounds: Normal breath sounds. No wheezing.     Comments: Good air exch Chest:     Chest wall: No tenderness.  Abdominal:     General: Bowel sounds are normal. There is no distension or abdominal bruit.     Palpations: Abdomen is soft. There is no mass.     Tenderness: There is no abdominal tenderness.     Hernia: No hernia is present.  Genitourinary:    Comments: Breast exam: No mass, nodules, thickening, tenderness, bulging, retraction, inflamation, nipple discharge or skin changes noted.  No axillary or clavicular LA.     Musculoskeletal:        General: No tenderness. Normal range of motion.     Cervical back: Normal range of motion and neck supple. No  rigidity. No muscular tenderness.     Right lower leg: No edema.     Left lower leg: No edema.     Comments: No kyphosis   Lymphadenopathy:     Cervical: No cervical adenopathy.  Skin:    General: Skin is warm and dry.     Coloration: Skin is not pale.     Findings: No erythema or rash.  Neurological:     Mental Status: She is alert. Mental status is at baseline.     Cranial Nerves: No cranial nerve deficit.  Motor: No abnormal muscle tone.     Coordination: Coordination normal.     Gait: Gait normal.     Deep Tendon Reflexes: Reflexes are normal and symmetric. Reflexes normal.  Psychiatric:        Mood and Affect: Mood normal.        Cognition and Memory: Cognition and memory normal.           Assessment & Plan:   Problem List Items Addressed This Visit       Cardiovascular and Mediastinum   HYPERTENSION, BENIGN ESSENTIAL   bp in fair control at this time  BP Readings from Last 1 Encounters:  04/27/24 121/70   No changes needed Most recent labs reviewed  Disc lifstyle change with low sodium diet and exercise  Taking amlodipine  5 mg daily  Lisinopril  10 mg daily         Musculoskeletal and Integument   Osteopenia   Due for dexa in December Ordered  Letrozole  one more year  Discussed fall prevention, supplements and exercise for bone density  No falls or fractures  D level in normal range          Other   Vitamin D  deficiency   Last vitamin D  Lab Results  Component Value Date   VD25OH 33.42 04/20/2024   Vitamin D  level is therapeutic with current supplementation Disc importance of this to bone and overall health       Routine general medical examination at a health care facility - Primary   Reviewed health habits including diet and exercise and skin cancer prevention Reviewed appropriate screening tests for age  Also reviewed health mt list, fam hx and immunization status , as well as social and family history   See HPI Labs reviewed and  ordered  Health Maintenance  Topic Date Due   Medicare Annual Wellness Visit  Never done   Hepatitis C Screening  Never done   Colon Cancer Screening  09/12/2023   Breast Cancer Screening  05/04/2024   COVID-19 Vaccine (4 - 2025-26 season) 05/13/2025*   Zoster (Shingles) Vaccine (1 of 2) 07/27/2025*   DTaP/Tdap/Td vaccine (3 - Td or Tdap) 01/05/2030   Pneumococcal Vaccine for age over 32  Completed   Flu Shot  Completed   DEXA scan (bone density measurement)  Completed   HPV Vaccine  Aged Out   Meningitis B Vaccine  Aged Out  *Topic was postponed. The date shown is not the original due date.    Flu shot today  Declines shingrix  Mammo scheduled  Dexa ordered  Discussed fall prevention, supplements and exercise for bone density  Colonoscopy scheduled in nov No falls or fractures  PHq 0       Hyperlipidemia   Disc goals for lipids and reasons to control them Rev last labs with pt Rev low sat fat diet in detail LDL up to 131 Eating worse-is back on track now  Crestor  5 mg -may have to increase in future  Prevention atorvastatin  20 caused leg pain   Plan to re check lipids in 3 mo with better diet       Estrogen deficiency   Dexa ordered  Pt will call to schedule       Relevant Orders   DG Bone Density   Other Visit Diagnoses       Need for influenza vaccination       Relevant Orders   Flu vaccine HIGH DOSE PF(Fluzone Trivalent) (Completed)

## 2024-04-27 NOTE — Assessment & Plan Note (Signed)
Dexa ordered Pt will call to schedule

## 2024-04-27 NOTE — Assessment & Plan Note (Signed)
 bp in fair control at this time  BP Readings from Last 1 Encounters:  04/27/24 121/70   No changes needed Most recent labs reviewed  Disc lifstyle change with low sodium diet and exercise  Taking amlodipine  5 mg daily  Lisinopril  10 mg daily

## 2024-04-27 NOTE — Patient Instructions (Addendum)
 Keep walking  Add some strength training to your routine, this is important for bone and brain health and can reduce your risk of falls and help your body use insulin properly and regulate weight  Light weights, exercise bands , and internet videos are a good way to start  Yoga (chair or regular), machines , floor exercises or a gym with machines are also good options   Try to eat beef once a month maximum Avoid red meat/ fried foods/ egg yolks/ fatty breakfast meats/ butter, cheese and high fat dairy/ and shellfish   Let's re check cholesterol in 3 months with better diet   Flu shot today    You have an order for:  []   3D Mammogram  [x]   Bone Density     Please call for appointment:   [x]   [facility]  [address] [place] [address]  [phone]  []   [facility])   [address]  [place], [place] [address]  [phone]  []   [facility]      [address] [place], [place]        [phone]         []   [facility]  [address] [place], [place]  [phone]  []  [facility]   [address]   [place], [place] [address]  [phone]  []  [facility]  [address] [place], [place] [address] [phone]    Make sure to wear two piece clothing  No lotions powders or deodorants the day of the appointment Make sure to bring picture ID and insurance card.  Bring list of medications you are currently taking including any supplements.   Schedule your screening mammogram through MyChart!   Select [facility] imaging sites can now be scheduled through MyChart.  Log into your MyChart account.  Go to 'Visit' (or 'Appointments' if  on mobile App) --> Schedule an  Appointment  Under 'Select a Reason for  Visit' choose the Mammogram  Screening option.  Complete the pre-visit questions  and select the time and place that  best fits your schedule

## 2024-05-05 ENCOUNTER — Ambulatory Visit
Admission: RE | Admit: 2024-05-05 | Discharge: 2024-05-05 | Disposition: A | Discharge status: Home / Self Care | Source: Ambulatory Visit | Attending: General Surgery | Admitting: General Surgery

## 2024-05-05 DIAGNOSIS — Z1231 Encounter for screening mammogram for malignant neoplasm of breast: Secondary | ICD-10-CM | POA: Diagnosis not present

## 2024-05-19 ENCOUNTER — Other Ambulatory Visit

## 2024-05-25 DIAGNOSIS — C50212 Malignant neoplasm of upper-inner quadrant of left female breast: Secondary | ICD-10-CM | POA: Diagnosis not present

## 2024-06-03 ENCOUNTER — Ambulatory Visit (AMBULATORY_SURGERY_CENTER)

## 2024-06-03 VITALS — Ht 64.0 in | Wt 165.4 lb

## 2024-06-03 DIAGNOSIS — Z860100 Personal history of colon polyps, unspecified: Secondary | ICD-10-CM

## 2024-06-03 MED ORDER — NA SULFATE-K SULFATE-MG SULF 17.5-3.13-1.6 GM/177ML PO SOLN
1.0000 | Freq: Once | ORAL | 0 refills | Status: AC
Start: 2024-06-03 — End: 2024-06-03

## 2024-06-03 NOTE — Progress Notes (Signed)
 No egg or soy allergy known to patient  No issues known to pt with past sedation with any surgeries or procedures Patient denies ever being told they had issues or difficulty with intubation  No FH of Malignant Hyperthermia Pt is not on diet pills Pt is not on  home 02  Pt is not on blood thinners  Pt denies issues with constipation  No A fib or A flutter Have any cardiac testing pending--No Pt can ambulate  Pt denies use of chewing tobacco Discussed diabetic I weight loss medication holds Discussed NSAID holds Checked BMI Pt instructed to use Singlecare.com or GoodRx for a price reduction on prep  Patient's chart reviewed by Norleen Schillings CNRA prior to previsit and patient appropriate for the [facility].  Pre visit completed and red dot placed by patient's name on their procedure day (on provider's schedule).    States she cannot tolerate Sutab , but has had Suprep in the past without difficulty.  Suprep sent to pharmacy via [facility]

## 2024-06-14 ENCOUNTER — Encounter: Payer: Self-pay | Admitting: Internal Medicine

## 2024-06-17 ENCOUNTER — Encounter: Payer: Self-pay | Admitting: Internal Medicine

## 2024-06-17 ENCOUNTER — Ambulatory Visit (AMBULATORY_SURGERY_CENTER): Admitting: Internal Medicine

## 2024-06-17 VITALS — BP 139/61 | HR 62 | Temp 97.9°F | Resp 15 | Ht 64.0 in | Wt 165.0 lb

## 2024-06-17 DIAGNOSIS — K5730 Diverticulosis of large intestine without perforation or abscess without bleeding: Secondary | ICD-10-CM

## 2024-06-17 DIAGNOSIS — Z1211 Encounter for screening for malignant neoplasm of colon: Secondary | ICD-10-CM

## 2024-06-17 DIAGNOSIS — Z860100 Personal history of colon polyps, unspecified: Secondary | ICD-10-CM

## 2024-06-17 DIAGNOSIS — Z860101 Personal history of adenomatous and serrated colon polyps: Secondary | ICD-10-CM | POA: Diagnosis not present

## 2024-06-17 DIAGNOSIS — K648 Other hemorrhoids: Secondary | ICD-10-CM

## 2024-06-17 MED ORDER — SODIUM CHLORIDE 0.9 % IV SOLN: 500.0000 mL | Freq: Once | INTRAVENOUS | Status: DC

## 2024-06-17 NOTE — Patient Instructions (Signed)
Thank you for letting us take care of your healthcare needs today. Please see handouts given to you on Hemorrhoids and Diverticulosis.    YOU HAD AN ENDOSCOPIC PROCEDURE TODAY AT THE [facility]:   Refer to the procedure report that was given to you for any specific questions about what was found during the examination.  If the procedure report does not answer your questions, please call your gastroenterologist to clarify.  If you requested that your care partner not be given the details of your procedure findings, then the procedure report has been included in a sealed envelope for you to review at your convenience later.  YOU SHOULD EXPECT: Some feelings of bloating in the abdomen. Passage of more gas than usual.  Walking can help get rid of the air that was put into your GI tract during the procedure and reduce the bloating. If you had a lower endoscopy (such as a colonoscopy or flexible sigmoidoscopy) you may notice spotting of blood in your stool or on the toilet paper. If you underwent a bowel prep for your procedure, you may not have a normal bowel movement for a few days.  Please Note:  You might notice some irritation and congestion in your nose or some drainage.  This is from the oxygen used during your procedure.  There is no need for concern and it should clear up in a day or so.  SYMPTOMS TO REPORT IMMEDIATELY:  Following lower endoscopy (colonoscopy or flexible sigmoidoscopy):  Excessive amounts of blood in the stool  Significant tenderness or worsening of abdominal pains  Swelling of the abdomen that is new, acute  Fever of 100F or higher  For urgent or emergent issues, a gastroenterologist can be reached at any hour by calling [phone]. Do not use MyChart messaging for urgent concerns.    DIET:  We do recommend a small meal at first, but then you may proceed to your regular diet.  Drink plenty of fluids but you should avoid alcoholic beverages for 24  hours.  ACTIVITY:  You should plan to take it easy for the rest of today and you should NOT DRIVE or use heavy machinery until tomorrow (because of the sedation medicines used during the test).    FOLLOW UP: Our staff will call the number listed on your records the next business day following your procedure.  We will call around 7:15- 8:00 am to check on you and address any questions or concerns that you may have regarding the information given to you following your procedure. If we do not reach you, we will leave a message.     If any biopsies were taken you will be contacted by phone or by letter within the next 1-3 weeks.  Please call us at [phone] if you have not heard about the biopsies in 3 weeks.    SIGNATURES/CONFIDENTIALITY: You and/or your care partner have signed paperwork which will be entered into your electronic medical record.  These signatures attest to the fact that that the information above on your After Visit Summary has been reviewed and is understood.  Full responsibility of the confidentiality of this discharge information lies with you and/or your care-partner.  

## 2024-06-17 NOTE — Progress Notes (Signed)
 Report to PACU, RN, vss, BBS= Clear.

## 2024-06-17 NOTE — Progress Notes (Signed)
 GASTROENTEROLOGY PROCEDURE H&P NOTE   Primary Care Physician: Tower, Laine LABOR, MD    Reason for Procedure:   Hx multiple colon polyps  Plan:    colonoscopy  Patient is appropriate for endoscopic procedure(s) in the ambulatory ([facility]) setting.  The nature of the procedure, as well as the risks, benefits, and alternatives were carefully and thoroughly reviewed with the patient. Ample time for discussion and questions allowed.  All questions were answered. The patient understood, was satisfied, and agreed with the plan to proceed.    HPI: Brittney Johnson is a 68 y.o. female who presents for surveillance colonoscopy.  Medical history as below.  Tolerated the prep.  No recent chest pain or shortness of breath.  No abdominal pain today.  Past Medical History:  Diagnosis Date   Actinic keratosis    Atypical pneumonia 1998   not hosp   Breast cancer (HCC) 2021   left-sx, radation completed   DDD (degenerative disc disease), cervical    Family history of breast cancer    Family history of lung cancer    Hyperlipidemia    Hypertension    Low back pain    on and off   PONV (postoperative nausea and vomiting)    due to some sedations!   Syncope and collapse 07/16/2007    Past Surgical History:  Procedure Laterality Date   ABDOMINAL HYSTERECTOMY     supracervical- L ovary remains   BREAST BIOPSY Left 02/22/2020   Q clip us  bx, [facility]   CARDIOVASCULAR STRESS TEST     08/28/06 normal    COLONOSCOPY  08/13/2016   [facility] Harkins   HAND SURGERY Right    KNEE SURGERY Left    meniscus fair   left hand surgery Left 2016   Dupuytrens   LUMBAR DISC SURGERY  08/2017   [facility]   OOPHORECTOMY Right    Right ovary removed    PARTIAL MASTECTOMY WITH NEEDLE LOCALIZATION AND AXILLARY SENTINEL LYMPH NODE BX Left 03/15/2020   Procedure: PARTIAL MASTECTOMY WITH RF TAG PLACEMENT AND AXILLARY SENTINEL LYMPH NODE BX;  Surgeon: [name], MD;  Location: [facility] ORS;  Service: General;  Laterality:  Left;   POLYPECTOMY     TUBAL LIGATION     ? 1979    Prior to Admission medications   Medication Sig Start Date End Date Taking? Authorizing Provider  amLODipine  (NORVASC ) 5 MG tablet TAKE 1 TABLET DAILY 06/11/23  Yes Tower, Laine LABOR, MD  Calcium  Citrate-Vitamin D  (CALCIUM  + D PO) Take by mouth daily.   Yes [provider]  cholecalciferol (VITAMIN D3) 25 MCG (1000 UNIT) tablet Take 2,000 Units by mouth daily.   Yes [provider]  cyanocobalamin  (VITAMIN B12) 1000 MCG tablet Take 1,000 mcg by mouth daily.   Yes [provider]  letrozole  (FEMARA ) 2.5 MG tablet Take 1 tablet (2.5 mg total) by mouth daily. 01/02/24  Yes Brahmanday, Govinda R, MD  lisinopril  (ZESTRIL ) 10 MG tablet TAKE 1 TABLET DAILY 06/11/23  Yes Tower, Laine LABOR, MD  Omega-3 Fatty Acids (FISH OIL) 1200 MG CAPS Take 1,200 mg by mouth daily.   Yes [provider]  rosuvastatin  (CRESTOR ) 5 MG tablet TAKE 1 TABLET DAILY 06/11/23  Yes Tower, Laine LABOR, MD    Current Outpatient Medications  Medication Sig Dispense Refill   amLODipine  (NORVASC ) 5 MG tablet TAKE 1 TABLET DAILY 90 tablet 3   Calcium  Citrate-Vitamin D  (CALCIUM  + D PO) Take by mouth daily.  cholecalciferol (VITAMIN D3) 25 MCG (1000 UNIT) tablet Take 2,000 Units by mouth daily.     cyanocobalamin  (VITAMIN B12) 1000 MCG tablet Take 1,000 mcg by mouth daily.     letrozole  (FEMARA ) 2.5 MG tablet Take 1 tablet (2.5 mg total) by mouth daily. 90 tablet 3   lisinopril  (ZESTRIL ) 10 MG tablet TAKE 1 TABLET DAILY 90 tablet 3   Omega-3 Fatty Acids (FISH OIL) 1200 MG CAPS Take 1,200 mg by mouth daily.     rosuvastatin  (CRESTOR ) 5 MG tablet TAKE 1 TABLET DAILY 90 tablet 3   Current Facility-Administered Medications  Medication Dose Route Frequency Provider Last Rate Last Admin   0.9 %  sodium chloride  infusion  500 mL Intravenous Once Elicia Lui, Gordy HERO, MD        Allergies as of 06/17/2024 - Review Complete 06/17/2024  Allergen Reaction Noted    Codeine Other (See Comments)    Atorvastatin  Other (See Comments) 05/17/2022   Bee venom Swelling 02/20/2015   Magnesium sulfate-potassium cl-sodium sulfate Nausea Only and Dermatitis 05/08/2021    Family History  Problem Relation Age of Onset   Lung cancer Father 66   Hyperlipidemia Mother    Diabetes Mother    Hypertension Mother    Stroke Mother    Heart disease Other    Lung cancer Other        uncle   Diabetes Other        aunt   Coronary artery disease Maternal Grandfather    Breast cancer Maternal Aunt        dx under 50   Lung cancer Maternal Uncle    Lung cancer Paternal Uncle    Cancer Cousin        unk types   Cancer Cousin        unk types   Thyroid  disease Neg Hx    Colon cancer Neg Hx    Colon polyps Neg Hx    Esophageal cancer Neg Hx    Rectal cancer Neg Hx    Stomach cancer Neg Hx     Social History   Socioeconomic History   Marital status: Married    Spouse name: Not on file   Number of children: Not on file   Years of education: Not on file   Highest education level: Not on file  Occupational History   Occupation: Production Designer, 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[employer]  Tobacco Use   Smoking status: Former    Current packs/day: 0.00    Average packs/day: 0.5 packs/day for 5.0 years (2.5 ttl pk-yrs)    Types: Cigarettes    Start date: 07/29/1992    Quit date: 07/29/1997    Years since quitting: 26.9   Smokeless tobacco: Never  Vaping Use   Vaping status: Never Used  Substance and Sexual Activity   Alcohol use: Yes    Alcohol/week: 1.0 standard drink of alcohol    Types: 1 Glasses of wine per week   Drug use: No   Sexual activity: Yes  Other Topics Concern   Not on file  Social History Narrative   Kids live close by, 3 grandkids- really enjoys, runs for exercise, ran a 5k March 2011; walks; quit 20 years; rare alcohol. Work for [employer]; lives in [place]. 2 boys- 40s.    Social Drivers of Corporate Investment Banker Strain: Not  on file  Food Insecurity: Not on file  Transportation Needs: Not on file  Physical Activity: Not on file  Stress:  Not on file  Social Connections: Not on file  Intimate Partner Violence: Not on file    Physical Exam: Vital signs in last 24 hours: @BP  (!) 141/78   Pulse 74   Temp 97.9 F (36.6 C) (Temporal)   Ht 5' 4 (1.626 m)   Wt 165 lb (74.8 kg)   LMP 07/30/2003   SpO2 99%   BMI 28.32 kg/m  GEN: NAD EYE: Sclerae anicteric ENT: MMM CV: Non-tachycardic Pulm: CTA b/l GI: Soft, NT/ND NEURO:  Alert & Oriented x 3   Gordy Starch, MD [facility]  06/17/2024 8:32 AM

## 2024-06-17 NOTE — Op Note (Signed)
 [facility] Patient Name: Brittney Johnson Procedure Date: 06/17/2024 8:33 AM MRN: [id number] Endoscopist: Gordy CHRISTELLA Starch , MD, [id number] Age: 68 Referring MD:  Date of Birth: January 09, 1956 Gender: Female Account #: [id number] Procedure:                Colonoscopy Indications:              High risk colon cancer surveillance: Personal                            history of multiple adenomas and sessile serrated                            polyps, Last colonoscopy: February 2022 (TA x 2,                            SSP x 2), Jan 2018 (TA > 1 cm, SSP) Medicines:                Monitored Anesthesia Care Procedure:                Pre-Anesthesia Assessment:                           - Prior to the procedure, a History and Physical                            was performed, and patient medications and                            allergies were reviewed. The patient's tolerance of                            previous anesthesia was also reviewed. The risks                            and benefits of the procedure and the sedation                            options and risks were discussed with the patient.                            All questions were answered, and informed consent                            was obtained. Prior Anticoagulants: The patient has                            taken no anticoagulant or antiplatelet agents. ASA                            Grade Assessment: II - A patient with mild systemic                            disease. After reviewing the risks and benefits,  the patient was deemed in satisfactory condition to                            undergo the procedure.                           After obtaining informed consent, the colonoscope                            was passed under direct vision. Throughout the                            procedure, the patient's blood pressure, pulse, and                            oxygen saturations were  monitored continuously. The                            Olympus Scope [id number] was introduced through the                            anus and advanced to the cecum, identified by                            appendiceal orifice and ileocecal valve. The                            colonoscopy was performed without difficulty. The                            patient tolerated the procedure well. The quality                            of the bowel preparation was good. The ileocecal                            valve, appendiceal orifice, and rectum were                            photographed. Scope In: 8:43:40 AM Scope Out: 8:57:32 AM Scope Withdrawal Time: 0 hours 10 minutes 10 seconds  Total Procedure Duration: 0 hours 13 minutes 52 seconds  Findings:                 The digital rectal exam was normal.                           Multiple small-mouthed diverticula were found in                            the sigmoid colon.                           Internal hemorrhoids were found during  retroflexion. The hemorrhoids were medium-sized.                           The exam was otherwise without abnormality. Complications:            No immediate complications. Estimated Blood Loss:     Estimated blood loss: none. Impression:               - Mild diverticulosis in the sigmoid colon.                           - Internal hemorrhoids.                           - The examination was otherwise normal.                           - No specimens collected. Recommendation:           - Patient has a contact number available for                            emergencies. The signs and symptoms of potential                            delayed complications were discussed with the                            patient. Return to normal activities tomorrow.                            Written discharge instructions were provided to the                            patient.                            - Resume previous diet.                           - Continue present medications.                           - Repeat colonoscopy in 5 years for surveillance. Gordy CHRISTELLA Starch, MD 06/17/2024 8:59:58 AM This report has been signed electronically.

## 2024-06-17 NOTE — Progress Notes (Signed)
 Pt's states no medical or surgical changes since previsit or office visit.

## 2024-06-18 ENCOUNTER — Telehealth: Payer: Self-pay

## 2024-06-18 NOTE — Telephone Encounter (Signed)
  Follow up Call-     06/17/2024    7:48 AM  Call back number  Post procedure Call Back phone  # [phone]  Permission to leave phone message Yes     Patient questions:  Do you have a fever, pain , or abdominal swelling? No. Pain Score  0 *  Have you tolerated food without any problems? Yes.    Have you been able to return to your normal activities? Yes.    Do you have any questions about your discharge instructions: Diet   No. Medications  No. Follow up visit  No.  Do you have questions or concerns about your Care? No.  Actions: * If pain score is 4 or above: No action needed, pain <4.

## 2024-06-22 ENCOUNTER — Encounter: Payer: Self-pay | Admitting: Family Medicine

## 2024-06-23 MED ORDER — ROSUVASTATIN CALCIUM 5 MG PO TABS: 5.0000 mg | ORAL_TABLET | Freq: Every day | ORAL | 2 refills | Status: AC

## 2024-06-23 MED ORDER — LISINOPRIL 10 MG PO TABS: 10.0000 mg | ORAL_TABLET | Freq: Every day | ORAL | 2 refills | Status: AC

## 2024-06-23 MED ORDER — AMLODIPINE BESYLATE 5 MG PO TABS: 5.0000 mg | ORAL_TABLET | Freq: Every day | ORAL | 2 refills | Status: AC

## 2024-06-29 ENCOUNTER — Ambulatory Visit

## 2024-06-29 VITALS — BP 139/61 | Ht 64.0 in | Wt 165.0 lb

## 2024-06-29 DIAGNOSIS — Z0000 Encounter for general adult medical examination without abnormal findings: Secondary | ICD-10-CM

## 2024-06-29 NOTE — Progress Notes (Signed)
 I connected with  Brittney Johnson on 06/29/24 by a video and audio enabled telemedicine application and verified that I am speaking with the correct person using two identifiers.  Patient Location: Home  Provider Location: Home Office  Persons Participating in Visit: Patient.  I discussed the limitations of evaluation and management by telemedicine. The patient expressed understanding and agreed to proceed.  Vital Signs: Because this visit was a virtual/telehealth visit, some criteria may be missing or patient reported. Any vitals not documented were not able to be obtained and vitals that have been documented are patient reported.   This visit was performed by a medical professional under my direct supervision. I was immediately available for consultation/collaboration. I have reviewed and agree with the Annual Wellness Visit documentation.   Because this visit was a virtual/telehealth visit,  certain criteria was not obtained, such a blood pressure, CBG if applicable, and timed get up and go. Any medications not marked as taking were not mentioned during the medication reconciliation part of the visit. Any vitals not documented were not able to be obtained due to this being a telehealth visit or patient was unable to self-report a recent blood pressure reading due to a lack of equipment at home via telehealth. Vitals that have been documented are verbally provided by the patient.  Chief Complaint  Patient presents with   Medicare Wellness     Subjective:   Brittney Johnson is a 68 y.o. female who presents for a Medicare Annual Wellness Visit.  Visit info / Clinical Intake: Medicare Wellness Visit Type:: Initial Annual Wellness Visit Persons participating in visit and providing information:: patient Medicare Wellness Visit Mode:: Video Since this visit was completed virtually, some vitals may be partially provided or unavailable. Missing vitals are due to the limitations of the  virtual format.: Documented vitals are patient reported If Telephone or Video please confirm:: I connected with patient using audio/video enable telemedicine. I verified patient identity with two identifiers, discussed telehealth limitations, and patient agreed to proceed. Patient Location:: home Provider Location:: home office Interpreter Needed?: No Pre-visit prep was completed: yes AWV questionnaire completed by patient prior to visit?: no Living arrangements:: lives with spouse/significant other Patient's Overall Health Status Rating: very good Typical amount of pain: none Does pain affect daily life?: no Are you currently prescribed opioids?: no  Dietary Habits and Nutritional Risks How many meals a day?: 3 Eats fruit and vegetables daily?: yes Most meals are obtained by: preparing own meals In the last 2 weeks, have you had any of the following?: none Diabetic:: no  Functional Status Activities of Daily Living (to include ambulation/medication): Independent Ambulation: Independent Medication Administration: Independent Home Management (perform basic housework or laundry): Independent Manage your own finances?: yes Primary transportation is: driving Concerns about vision?: no *vision screening is required for WTM* Concerns about hearing?: no  Fall Screening Falls in the past year?: 0 Number of falls in past year: 0 Was there an injury with Fall?: 0 Fall Risk Category Calculator: 0 Patient Fall Risk Level: Low Fall Risk  Fall Risk Patient at Risk for Falls Due to: No Fall Risks Fall risk Follow up: Falls evaluation completed  Home and Transportation Safety: All rugs have non-skid backing?: N/A, no rugs All stairs or steps have railings?: yes Grab bars in the bathtub or shower?: yes Have non-skid surface in bathtub or shower?: yes Good home lighting?: yes Regular seat belt use?: yes Hospital stays in the last year:: no  Cognitive Assessment Difficulty  concentrating, remembering, or making decisions? : no Will 6CIT or Mini Cog be Completed: yes What year is it?: 0 points What month is it?: 0 points Give patient an address phrase to remember (5 components): remember words apple , table , penny About what time is it?: 0 points Count backwards from 20 to 1: 0 points Say the months of the year in reverse: 0 points Repeat the address phrase from earlier: 0 points 6 CIT Score: 0 points  Advance Directives (For Healthcare) Does Patient Have a Medical Advance Directive?: No Would patient like information on creating a medical advance directive?: No - Patient declined  Reviewed/Updated  Reviewed/Updated: Reviewed All (Medical, Surgical, Family, Medications, Allergies, Care Teams, Patient Goals)    Allergies (verified) Codeine, Atorvastatin , Bee venom, and Magnesium sulfate-potassium cl-sodium sulfate   Current Medications (verified) Outpatient Encounter Medications as of 06/29/2024  Medication Sig   amLODipine  (NORVASC ) 5 MG tablet Take 1 tablet (5 mg total) by mouth daily.   Calcium  Citrate-Vitamin D  (CALCIUM  + D PO) Take by mouth daily.   cholecalciferol (VITAMIN D3) 25 MCG (1000 UNIT) tablet Take 2,000 Units by mouth daily.   cyanocobalamin  (VITAMIN B12) 1000 MCG tablet Take 1,000 mcg by mouth daily.   letrozole  (FEMARA ) 2.5 MG tablet Take 1 tablet (2.5 mg total) by mouth daily.   lisinopril  (ZESTRIL ) 10 MG tablet Take 1 tablet (10 mg total) by mouth daily.   Omega-3 Fatty Acids (FISH OIL) 1200 MG CAPS Take 1,200 mg by mouth daily.   rosuvastatin  (CRESTOR ) 5 MG tablet Take 1 tablet (5 mg total) by mouth daily.   No facility-administered encounter medications on file as of 06/29/2024.    History: Past Medical History:  Diagnosis Date   Actinic keratosis    Atypical pneumonia 1998   not hosp   Breast cancer (HCC) 2021   left-sx, radation completed   DDD (degenerative disc disease), cervical    Family history of breast cancer     Family history of lung cancer    Hyperlipidemia    Hypertension    Low back pain    on and off   PONV (postoperative nausea and vomiting)    due to some sedations!   Syncope and collapse 07/16/2007   Past Surgical History:  Procedure Laterality Date   ABDOMINAL HYSTERECTOMY     supracervical- L ovary remains   BREAST BIOPSY Left 02/22/2020   Q clip us  bx, [facility]   CARDIOVASCULAR STRESS TEST     08/28/06 normal    COLONOSCOPY  08/13/2016   Pyrtle   HAND SURGERY Right    KNEE SURGERY Left    meniscus fair   left hand surgery Left 2016   Dupuytrens   LUMBAR DISC SURGERY  08/2017   On[facility]a   OOPHORECTOMY Right    Right ovary removed    PARTIAL MASTECTOMY WITH NEEDLE LOCALIZATION AND AXILLARY SENTINEL LYMPH NODE BX Left 03/15/2020   Procedure: PARTIAL MASTECTOMY WITH RF TAG PLACEMENT AND AXILLARY SENTINEL LYMPH NODE BX;  Surgeon: [name], MD;  Location: [facility] ORS;  Service: General;  Laterality: Left;   POLYPECTOMY     TUBAL LIGATION     ? 1979   Family History  Problem Relation Age of Onset   Lung cancer Father 52   Hyperlipidemia Mother    Diabetes Mother    Hypertension Mother    Stroke Mother    Heart disease Other    Lung cancer Other        uncle  Diabetes Other        aunt   Coronary artery disease Maternal Grandfather    Breast cancer Maternal Aunt        dx under 50   Lung cancer Maternal Uncle    Lung cancer Paternal Uncle    Cancer Cousin        unk types   Cancer Cousin        unk types   Thyroid  disease Neg Hx    Colon cancer Neg Hx    Colon polyps Neg Hx    Esophageal cancer Neg Hx    Rectal cancer Neg Hx    Stomach cancer Neg Hx    Social History   Occupational History   Occupation: Production Designer, 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[employer]  Tobacco Use   Smoking status: Former    Current packs/day: 0.00    Average packs/day: 0.5 packs/day for 5.0 years (2.5 ttl pk-yrs)    Types: Cigarettes    Start date: 07/29/1992    Quit date:  07/29/1997    Years since quitting: 26.9   Smokeless tobacco: Never  Vaping Use   Vaping status: Never Used  Substance and Sexual Activity   Alcohol use: Yes    Alcohol/week: 1.0 standard drink of alcohol    Types: 1 Glasses of wine per week   Drug use: No   Sexual activity: Yes   Tobacco Counseling Counseling given: Not Answered  SDOH Screenings   Food Insecurity: No Food Insecurity (06/29/2024)  Housing: Low Risk  (06/29/2024)  Transportation Needs: No Transportation Needs (06/29/2024)  Utilities: Not At Risk (06/29/2024)  Depression (PHQ2-9): Low Risk  (06/29/2024)  Physical Activity: Sufficiently Active (06/29/2024)  Social Connections: Moderately Isolated (06/29/2024)  Stress: No Stress Concern Present (06/29/2024)  Tobacco Use: Medium Risk (06/29/2024)  Health Literacy: Adequate Health Literacy (06/29/2024)   See flowsheets for full screening details  Depression Screen PHQ 2 & 9 Depression Scale- Over the past 2 weeks, how often have you been bothered by any of the following problems? Little interest or pleasure in doing things: 0 Feeling down, depressed, or hopeless (PHQ Adolescent also includes...irritable): 0 PHQ-2 Total Score: 0 Trouble falling or staying asleep, or sleeping too much: 0 Feeling tired or having little energy: 0 Poor appetite or overeating (PHQ Adolescent also includes...weight loss): 0 Feeling bad about yourself - or that you are a failure or have let yourself or your family down: 0 Trouble concentrating on things, such as reading the newspaper or watching television (PHQ Adolescent also includes...like school work): 0 Moving or speaking so slowly that other people could have noticed. Or the opposite - being so fidgety or restless that you have been moving around a lot more than usual: 0 Thoughts that you would be better off dead, or of hurting yourself in some way: 0 PHQ-9 Total Score: 0 If you checked off any problems, how difficult have these problems made  it for you to do your work, take care of things at home, or get along with other people?: Not difficult at all  Depression Treatment Depression Interventions/Treatment : EYV7-0 Score <4 Follow-up Not Indicated     Goals Addressed               This Visit's Progress     Patient Stated (pt-stated)        Patient would like to lose weight              Objective:    Today's  Vitals   06/29/24 1522  BP: 139/61  Weight: 165 lb (74.8 kg)  Height: 5' 4 (1.626 m)   Body mass index is 28.32 kg/m.  Hearing/Vision screen Hearing Screening - Comments:: No difficulties  Vision Screening - Comments:: Wears glasses and contacts. She is up to date . [facility]  Immunizations and Health Maintenance Health Maintenance  Topic Date Due   Medicare Annual Wellness (AWV)  Never done   Hepatitis C Screening  Never done   Bone Density Scan  07/03/2024   COVID-19 Vaccine (4 - 2025-26 season) 05/13/2025 (Originally 03/29/2024)   Zoster Vaccines- Shingrix (1 of 2) 07/27/2025 (Originally 10/20/1974)   Mammogram  05/05/2025   Colonoscopy  06/17/2029   DTaP/Tdap/Td (3 - Td or Tdap) 01/05/2030   Pneumococcal Vaccine: 50+ Years  Completed   Influenza Vaccine  Completed   Meningococcal B Vaccine  Aged Out        Assessment/Plan:  This is a routine wellness examination for Brittney Johnson.  Patient Care Team: Tower, Laine LABOR, MD as PCP - General [name] Aran, MD as Referring Physician (Radiation Oncology) [name], RN as Oncology Nurse Navigator [name], MD as Consulting Physician (General Surgery) [name], MD as Consulting Physician (Internal Medicine)  I have personally reviewed and noted the following in the patient's chart:   Medical and social history Use of alcohol, tobacco or illicit drugs  Current medications and supplements including opioid prescriptions. Functional ability and status Nutritional status Physical  activity Advanced directives List of other physicians Hospitalizations, surgeries, and ER visits in previous 12 months Vitals Screenings to include cognitive, depression, and falls Referrals and appointments  No orders of the defined types were placed in this encounter.  In addition, I have reviewed and discussed with patient certain preventive protocols, quality metrics, and best practice recommendations. A written personalized care plan for preventive services as well as general preventive health recommendations were provided to patient.   Brittney Johnson Right, [place]   06/29/2024   No follow-ups on file.  After Visit Summary: (MyChart) Due to this being a telephonic visit, the after visit summary with patients personalized plan was offered to patient via MyChart   Nurse Notes: nothing to report

## 2024-06-29 NOTE — Patient Instructions (Signed)
 [name],  Thank you for taking the time for your Medicare Wellness Visit. I appreciate your continued commitment to your health goals. Please review the care plan we discussed, and feel free to reach out if I can assist you further.  Please note that Annual Wellness Visits do not include a physical exam. Some assessments may be limited, especially if the visit was conducted virtually. If needed, we may recommend an in-person follow-up with your provider.  Ongoing Care Seeing your primary care provider every 3 to 6 months helps us  monitor your health and provide consistent, personalized care.   Referrals If a referral was made during today's visit and you haven't received any updates within two weeks, please contact the referred provider directly to check on the status.  Recommended Screenings:  Health Maintenance  Topic Date Due   Medicare Annual Wellness Visit  Never done   Hepatitis C Screening  Never done   Osteoporosis screening with Bone Density Scan  07/03/2024   COVID-19 Vaccine (4 - 2025-26 season) 05/13/2025*   Zoster (Shingles) Vaccine (1 of 2) 07/27/2025*   Breast Cancer Screening  05/05/2025   Colon Cancer Screening  06/17/2029   DTaP/Tdap/Td vaccine (3 - Td or Tdap) 01/05/2030   Pneumococcal Vaccine for age over 76  Completed   Flu Shot  Completed   Meningitis B Vaccine  Aged Out  *Topic was postponed. The date shown is not the original due date.       06/29/2024    3:28 PM  Advanced Directives  Does Patient Have a Medical Advance Directive? No    Vision: Annual vision screenings are recommended for early detection of glaucoma, cataracts, and diabetic retinopathy. These exams can also reveal signs of chronic conditions such as diabetes and high blood pressure.  Dental: Annual dental screenings help detect early signs of oral cancer, gum disease, and other conditions linked to overall health, including heart disease and diabetes.  Please see the attached  documents for additional preventive care recommendations.

## 2024-07-02 ENCOUNTER — Ambulatory Visit: Admitting: Internal Medicine

## 2024-07-02 ENCOUNTER — Other Ambulatory Visit

## 2024-07-05 ENCOUNTER — Ambulatory Visit
Admission: RE | Admit: 2024-07-05 | Discharge: 2024-07-05 | Disposition: A | Source: Ambulatory Visit | Attending: Family Medicine | Admitting: Family Medicine

## 2024-07-05 DIAGNOSIS — E2839 Other primary ovarian failure: Secondary | ICD-10-CM | POA: Diagnosis not present

## 2024-07-05 DIAGNOSIS — M85852 Other specified disorders of bone density and structure, left thigh: Secondary | ICD-10-CM | POA: Diagnosis not present

## 2024-07-05 DIAGNOSIS — M85851 Other specified disorders of bone density and structure, right thigh: Secondary | ICD-10-CM | POA: Diagnosis not present

## 2024-07-05 DIAGNOSIS — Z780 Asymptomatic menopausal state: Secondary | ICD-10-CM | POA: Diagnosis not present

## 2024-07-06 ENCOUNTER — Ambulatory Visit: Payer: Self-pay | Admitting: Family Medicine

## 2024-07-12 ENCOUNTER — Encounter: Payer: Self-pay | Admitting: Internal Medicine

## 2024-07-12 ENCOUNTER — Inpatient Hospital Stay: Payer: Self-pay | Attending: Internal Medicine

## 2024-07-12 ENCOUNTER — Inpatient Hospital Stay: Payer: Self-pay | Admitting: Internal Medicine

## 2024-07-12 VITALS — BP 120/68 | HR 77 | Temp 98.1°F | Resp 16 | Wt 166.3 lb

## 2024-07-12 DIAGNOSIS — Z801 Family history of malignant neoplasm of trachea, bronchus and lung: Secondary | ICD-10-CM | POA: Diagnosis not present

## 2024-07-12 DIAGNOSIS — C50212 Malignant neoplasm of upper-inner quadrant of left female breast: Secondary | ICD-10-CM | POA: Insufficient documentation

## 2024-07-12 DIAGNOSIS — Z79811 Long term (current) use of aromatase inhibitors: Secondary | ICD-10-CM | POA: Diagnosis not present

## 2024-07-12 DIAGNOSIS — Z1732 Human epidermal growth factor receptor 2 negative status: Secondary | ICD-10-CM | POA: Diagnosis not present

## 2024-07-12 DIAGNOSIS — Z1721 Progesterone receptor positive status: Secondary | ICD-10-CM | POA: Diagnosis not present

## 2024-07-12 DIAGNOSIS — Z803 Family history of malignant neoplasm of breast: Secondary | ICD-10-CM | POA: Diagnosis not present

## 2024-07-12 DIAGNOSIS — Z90710 Acquired absence of both cervix and uterus: Secondary | ICD-10-CM | POA: Insufficient documentation

## 2024-07-12 DIAGNOSIS — Z87891 Personal history of nicotine dependence: Secondary | ICD-10-CM | POA: Insufficient documentation

## 2024-07-12 DIAGNOSIS — Z170 Estrogen receptor positive status [ER+]: Secondary | ICD-10-CM | POA: Insufficient documentation

## 2024-07-12 DIAGNOSIS — M8580 Other specified disorders of bone density and structure, unspecified site: Secondary | ICD-10-CM | POA: Insufficient documentation

## 2024-07-12 LAB — CBC WITH DIFFERENTIAL (CANCER CENTER ONLY)
Abs Immature Granulocytes: 0.02 K/uL (ref 0.00–0.07)
Basophils Absolute: 0.1 K/uL (ref 0.0–0.1)
Basophils Relative: 1 %
Eosinophils Absolute: 0.1 K/uL (ref 0.0–0.5)
Eosinophils Relative: 2 %
HCT: 36.9 % (ref 36.0–46.0)
Hemoglobin: 12.1 g/dL (ref 12.0–15.0)
Immature Granulocytes: 0 %
Lymphocytes Relative: 29 %
Lymphs Abs: 1.8 K/uL (ref 0.7–4.0)
MCH: 29.2 pg (ref 26.0–34.0)
MCHC: 32.8 g/dL (ref 30.0–36.0)
MCV: 89.1 fL (ref 80.0–100.0)
Monocytes Absolute: 0.6 K/uL (ref 0.1–1.0)
Monocytes Relative: 10 %
Neutro Abs: 3.6 K/uL (ref 1.7–7.7)
Neutrophils Relative %: 58 %
Platelet Count: 216 K/uL (ref 150–400)
RBC: 4.14 MIL/uL (ref 3.87–5.11)
RDW: 13.6 % (ref 11.5–15.5)
WBC Count: 6.1 K/uL (ref 4.0–10.5)
nRBC: 0 % (ref 0.0–0.2)

## 2024-07-12 LAB — CMP (CANCER CENTER ONLY)
ALT: 16 U/L (ref 0–44)
AST: 17 U/L (ref 15–41)
Albumin: 4.6 g/dL (ref 3.5–5.0)
Alkaline Phosphatase: 68 U/L (ref 38–126)
Anion gap: 10 (ref 5–15)
BUN: 17 mg/dL (ref 8–23)
CO2: 27 mmol/L (ref 22–32)
Calcium: 9.8 mg/dL (ref 8.9–10.3)
Chloride: 100 mmol/L (ref 98–111)
Creatinine: 0.77 mg/dL (ref 0.44–1.00)
GFR, Estimated: >60 mL/min (ref >60)
Glucose, Bld: 97 mg/dL (ref 70–99)
Potassium: 4.5 mmol/L (ref 3.5–5.1)
Sodium: 137 mmol/L (ref 135–145)
Total Bilirubin: 0.3 mg/dL (ref 0.0–1.2)
Total Protein: 7.5 g/dL (ref 6.5–8.1)

## 2024-07-12 LAB — VITAMIN D 25 HYDROXY (VIT D DEFICIENCY, FRACTURES): Vit D, 25-Hydroxy: 55.62 ng/mL (ref 30–100)

## 2024-07-12 MED ORDER — LETROZOLE 2.5 MG PO TABS: 2.5000 mg | ORAL_TABLET | Freq: Every day | ORAL | 3 refills | Status: AC

## 2024-07-12 NOTE — Progress Notes (Signed)
 Doing well. Dr Cesar Coe orders mammograms, last one 10/25. Taking Letrozole  without side effects.

## 2024-07-12 NOTE — Assessment & Plan Note (Addendum)
#  Left breast cancer stage I ER/PR positive HER-2 negative; s/p lumpectomy; on adjuvant letrozole  until 2026-fall ] mammogram OCT 2025- -Dr. Cesar. Stable.   # On letrozole  tolerating  fairly well.  Again reviewed that she will continue until fall 2026.  # Osteopenia-DEC 2025- Osteopenia based on BMD  Given the stability of the bone density recommend continued monitoring [PCP]. Patient physically active/exercising.  continue  MVT- Ca 1200 mg+ 1000+  vit D 2000 mg/day.   MARCH 2024- vit D 61.  mychart # DISPOSITION: # Follow-up in 6 months-MD; cbc/cmp; vit D 25-OH levels- Dr. KATHEE.

## 2024-07-12 NOTE — Progress Notes (Signed)
 [facility] CONSULT NOTE  Patient Care Team: Tower, Laine LABOR, MD as PCP - General [name] Aran, MD as Referring Physician (Radiation Oncology) [name], RN as Oncology Nurse Navigator [name], MD as Consulting Physician (General Surgery) [name], MD as Consulting Physician (Oncology)  CHIEF COMPLAINTS/PURPOSE OF CONSULTATION: Breast cancer  #  Oncology History Overview Note  # LEFT BREAST CA- INVASIVE MAMMARY CARCINOMA, NO SPECIAL TYPE, WITH MICROPAPILLARY  FEATURES; ER/PR Positive [90%]; Her 2 NEG; G-1; 0.8 x 0.7 x 1 cm.  Oncotype recurrence score= 0; 3% distant recurrence; no chemotherapy; adjuvant radiation;  s/p RT [nov 5th, 2021];   # NOV end 221- START Anastrazole[previous HRT*]; JUNE 16th, 2023- Discontinue anastrozole ; switch over to exemestane  given patient's MSK side effects  # # SURVIVORSHIP:   # GENETICS: No pathogenic variants; VUS*  DIAGNOSIS: Cure  STAGE: 1        ;  GOALS: Cure  CURRENT/MOST RECENT THERAPY :     Carcinoma of upper-inner quadrant of left breast in female, estrogen receptor positive (HCC)  03/01/2020 Initial Diagnosis   Carcinoma of upper-inner quadrant of left breast in female, estrogen receptor positive (HCC)    Genetic Testing   Negative genetic testing. No pathogenic variants identified on the [facility] Multi-Cancer Panel. VUS in AIP called c.790_792del and VUS in RET called c.1163T>C identified. The report date is 04/07/2020.  The Multi-Cancer Panel offered by [facility] includes sequencing and/or deletion duplication testing of the following 85 genes: AIP, ALK, APC, ATM, AXIN2,BAP1,  BARD1, BLM, BMPR1A, BRCA1, BRCA2, BRIP1, CASR, CDC73, CDH1, CDK4, CDKN1B, CDKN1C, CDKN2A (p14ARF), CDKN2A (p16INK4a), CEBPA, CHEK2, CTNNA1, DICER1, DIS3L2, EGFR (c.2369C>T, p.Thr790Met variant only), EPCAM (Deletion/duplication testing only), FH, FLCN, GATA2, GPC3, GREM1 (Promoter region deletion/duplication testing  only), HOXB13 (c.251G>A, p.Gly84Glu), HRAS, KIT, MAX, MEN1, MET, MITF (c.952G>A, p.Glu318Lys variant only), MLH1, MSH2, MSH3, MSH6, MUTYH, NBN, NF1, NF2, NTHL1, PALB2, PDGFRA, PHOX2B, PMS2, POLD1, POLE, POT1, PRKAR1A, PTCH1, PTEN, RAD50, RAD51C, RAD51D, RB1, RECQL4, RET, RNF43, RUNX1, SDHAF2, SDHA (sequence changes only), SDHB, SDHC, SDHD, SMAD4, SMARCA4, SMARCB1, SMARCE1, STK11, SUFU, TERC, TERT, TMEM127, TP53, TSC1, TSC2, VHL, WRN and WT1.     HISTORY OF PRESENTING ILLNESS: Patient ambulating-independently.   Alone.  Brittney Johnson 68 y.o.  female diagnosis of stage I ER/PR positive HER-2 negative breast cancer on adjuvant letrozole  is here for follow-up.   Discussed the use of AI scribe software for clinical note transcription with the patient, who gave verbal consent to proceed.  History of Present Illness   Brittney Johnson is a 68 year old female with estrogen receptor positive carcinoma of the left breast who presents for routine oncology follow-up and management of osteopenia.  She remains on adjuvant letrozole  therapy. She reports no vasomotor symptoms or peripheral edema. She is physically active, reports feeling well, and denies new symptoms. Recent laboratory studies, including renal and hepatic panels, were within normal limits.  She recently underwent a mammogram, which was unremarkable, and a bone density scan demonstrating osteopenia. She continues calcium  and vitamin D  supplementation and strength training. Her most recent vitamin D  level was slightly decreased (in the 30s), but she remains adherent to supplementation.  She discussed ongoing letrozole  therapy and medication refills, reporting no issues with her pharmacy. She has no concerns regarding her current treatment regimen.      Review of Systems  Constitutional:  Negative for chills, diaphoresis, fever, malaise/fatigue and weight loss.  HENT:  Negative for nosebleeds and sore throat.   Eyes:  Negative for double  vision.  Respiratory:  Negative for cough, hemoptysis, sputum production, shortness of breath and wheezing.   Cardiovascular:  Negative for chest pain, palpitations, orthopnea and leg swelling.  Gastrointestinal:  Negative for abdominal pain, blood in stool, constipation, diarrhea, heartburn, melena, nausea and vomiting.  Genitourinary:  Negative for dysuria, frequency and urgency.  Musculoskeletal:  Positive for joint pain and myalgias. Negative for back pain.  Skin: Negative.  Negative for itching and rash.  Neurological:  Negative for dizziness, tingling, focal weakness, weakness and headaches.  Endo/Heme/Allergies:  Does not bruise/bleed easily.  Psychiatric/Behavioral:  Negative for depression. The patient is not nervous/anxious and does not have insomnia.      MEDICAL HISTORY:  Past Medical History:  Diagnosis Date   Actinic keratosis    Atypical pneumonia 1998   not hosp   Breast cancer (HCC) 2021   left-sx, radation completed   DDD (degenerative disc disease), cervical    Family history of breast cancer    Family history of lung cancer    Hyperlipidemia    Hypertension    Low back pain    on and off   PONV (postoperative nausea and vomiting)    due to some sedations!   Syncope and collapse 07/16/2007    SURGICAL HISTORY: Past Surgical History:  Procedure Laterality Date   ABDOMINAL HYSTERECTOMY     supracervical- L ovary remains   BREAST BIOPSY Left 02/22/2020   Q clip us  bx, [facility]   CARDIOVASCULAR STRESS TEST     08/28/06 normal    COLONOSCOPY  08/13/2016   Pyrtle   HAND SURGERY Right    KNEE SURGERY Left    meniscus fair   left hand surgery Left 2016   Dupuytrens   LUMBAR DISC SURGERY  08/2017   Onetha   OOPHORECTOMY Right    Right ovary removed    PARTIAL MASTECTOMY WITH NEEDLE LOCALIZATION AND AXILLARY SENTINEL LYMPH NODE BX Left 03/15/2020   Procedure: PARTIAL MASTECTOMY WITH RF TAG PLACEMENT AND AXILLARY SENTINEL LYMPH NODE BX;  Surgeon: [name], MD;  Location: [facility] ORS;  Service: General;  Laterality: Left;   POLYPECTOMY     TUBAL LIGATION     ? 1979    SOCIAL HISTORY: Social History   Socioeconomic History   Marital status: Married    Spouse name: Not on file   Number of children: Not on file   Years of education: Not on file   Highest education level: Not on file  Occupational History   Occupation: Production Designer, 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[employer]  Tobacco Use   Smoking status: Former    Current packs/day: 0.00    Average packs/day: 0.5 packs/day for 5.0 years (2.5 ttl pk-yrs)    Types: Cigarettes    Start date: 07/29/1992    Quit date: 07/29/1997    Years since quitting: 26.9   Smokeless tobacco: Never  Vaping Use   Vaping status: Never Used  Substance and Sexual Activity   Alcohol use: Yes    Alcohol/week: 1.0 standard drink of alcohol    Types: 1 Glasses of wine per week   Drug use: No   Sexual activity: Yes  Other Topics Concern   Not on file  Social History Narrative   Kids live close by, 3 grandkids- really enjoys, runs for exercise, ran a 5k March 2011; walks; quit 20 years; rare alcohol. Work for [employer]; lives in [place]. 2 boys- 40s.    Social Drivers of  Health   Tobacco Use: Medium Risk (07/12/2024)   Patient History    Smoking Tobacco Use: Former    Smokeless Tobacco Use: Never    Passive Exposure: Not on Actuary Strain: Not on file  Food Insecurity: No Food Insecurity (06/29/2024)   Epic    Worried About Programme Researcher, Broadcasting/film/video in the Last Year: Never true    Ran Out of Food in the Last Year: Never true  Transportation Needs: No Transportation Needs (06/29/2024)   Epic    Lack of Transportation (Medical): No    Lack of Transportation (Non-Medical): No  Physical Activity: Sufficiently Active (06/29/2024)   Exercise Vital Sign    Days of Exercise per Week: 5 days    Minutes of Exercise per Session: 60 min  Stress: No Stress Concern Present (06/29/2024)    [employer] of Occupational Health - Occupational Stress Questionnaire    Feeling of Stress: Not at all  Social Connections: Moderately Isolated (06/29/2024)   Social Connection and Isolation Panel    Frequency of Communication with Friends and Family: More than three times a week    Frequency of Social Gatherings with Friends and Family: More than three times a week    Attends Religious Services: Never    Database Administrator or Organizations: No    Attends Banker Meetings: Never    Marital Status: Married  Catering Manager Violence: Not At Risk (06/29/2024)   Epic    Fear of Current or Ex-Partner: No    Emotionally Abused: No    Physically Abused: No    Sexually Abused: No  Depression (PHQ2-9): Low Risk (07/12/2024)   Depression (PHQ2-9)    PHQ-2 Score: 0  Alcohol Screen: Not on file  Housing: Low Risk (06/29/2024)   Epic    Unable to Pay for Housing in the Last Year: No    Number of Times Moved in the Last Year: 0    Homeless in the Last Year: No  Utilities: Not At Risk (06/29/2024)   Epic    Threatened with loss of utilities: No  Health Literacy: Adequate Health Literacy (06/29/2024)   B1300 Health Literacy    Frequency of need for help with medical instructions: Never    FAMILY HISTORY: Family History  Problem Relation Age of Onset   Lung cancer Father 36   Hyperlipidemia Mother    Diabetes Mother    Hypertension Mother    Stroke Mother    Heart disease Other    Lung cancer Other        uncle   Diabetes Other        aunt   Coronary artery disease Maternal Grandfather    Breast cancer Maternal Aunt        dx under 50   Lung cancer Maternal Uncle    Lung cancer Paternal Uncle    Cancer Cousin        unk types   Cancer Cousin        unk types   Thyroid  disease Neg Hx    Colon cancer Neg Hx    Colon polyps Neg Hx    Esophageal cancer Neg Hx    Rectal cancer Neg Hx    Stomach cancer Neg Hx     ALLERGIES:  is allergic to codeine,  atorvastatin , bee venom, and magnesium sulfate-potassium cl-sodium sulfate.  MEDICATIONS:  Current Outpatient Medications  Medication Sig Dispense Refill   amLODipine  (NORVASC ) 5 MG tablet Take 1  tablet (5 mg total) by mouth daily. 90 tablet 2   Calcium  Citrate-Vitamin D  (CALCIUM  + D PO) Take by mouth daily.     cholecalciferol (VITAMIN D3) 25 MCG (1000 UNIT) tablet Take 2,000 Units by mouth daily.     cyanocobalamin  (VITAMIN B12) 1000 MCG tablet Take 1,000 mcg by mouth daily.     lisinopril  (ZESTRIL ) 10 MG tablet Take 1 tablet (10 mg total) by mouth daily. 90 tablet 2   Omega-3 Fatty Acids (FISH OIL) 1200 MG CAPS Take 1,200 mg by mouth daily.     rosuvastatin  (CRESTOR ) 5 MG tablet Take 1 tablet (5 mg total) by mouth daily. 90 tablet 2   letrozole  (FEMARA ) 2.5 MG tablet Take 1 tablet (2.5 mg total) by mouth daily. 90 tablet 3   No current facility-administered medications for this visit.      SABRA  PHYSICAL EXAMINATION: ECOG PERFORMANCE STATUS: 0 - Asymptomatic  Vitals:   07/12/24 1347  BP: 120/68  Pulse: 77  Resp: 16  Temp: 98.1 F (36.7 C)  SpO2: 98%   Filed Weights   07/12/24 1347  Weight: 166 lb 4.8 oz (75.4 kg)    Physical Exam HENT:     Head: Normocephalic and atraumatic.     Mouth/Throat:     Pharynx: No oropharyngeal exudate.  Eyes:     Pupils: Pupils are equal, round, and reactive to light.  Cardiovascular:     Rate and Rhythm: Normal rate and regular rhythm.  Pulmonary:     Effort: Pulmonary effort is normal. No respiratory distress.     Breath sounds: Normal breath sounds. No wheezing.  Abdominal:     General: Bowel sounds are normal. There is no distension.     Palpations: Abdomen is soft. There is no mass.     Tenderness: There is no abdominal tenderness. There is no guarding or rebound.  Musculoskeletal:        General: No tenderness. Normal range of motion.     Cervical back: Normal range of motion and neck supple.  Skin:    General: Skin is warm.   Neurological:     Mental Status: She is alert and oriented to person, place, and time.  Psychiatric:        Mood and Affect: Affect normal.    LABORATORY DATA:  I have reviewed the data as listed Lab Results  Component Value Date   WBC 6.1 07/12/2024   HGB 12.1 07/12/2024   HCT 36.9 07/12/2024   MCV 89.1 07/12/2024   PLT 216 07/12/2024   Recent Labs    01/02/24 1331 04/20/24 0757 07/12/24 1334  NA 137 140 137  K 4.6 4.4 4.5  CL 104 103 100  CO2 26 29 27   GLUCOSE 93 96 97  BUN 16 13 17   CREATININE 0.67 0.74 0.77  CALCIUM  9.5 9.8 9.8  GFRNONAA >60  --  >60  PROT 7.5 7.1 7.5  ALBUMIN 4.3 4.5 4.6  AST 19 16 17   ALT 17 15 16   ALKPHOS 58 58 68  BILITOT 0.6 0.4 0.3    RADIOGRAPHIC STUDIES: I have personally reviewed the radiological images as listed and agreed with the findings in the report. DG Bone Density Result Date: 07/06/2024 EXAM: DUAL X-RAY ABSORPTIOMETRY (DXA) FOR BONE MINERAL DENSITY 07/05/2024 11:17 am CLINICAL DATA:  68 year old Female Postmenopausal. Due in december for 2 year check, postmenopausal female with osteopenia Patient is or has been on bone building therapies. TECHNIQUE: An axial (e.g., hips,  spine) and/or appendicular (e.g., radius) exam was performed, as appropriate, using GE Secretary/administrator at [employer]. Images are obtained for bone mineral density measurement and are not obtained for diagnostic purposes. [id number] Exclusions: None. COMPARISON:  07/02/2021. FINDINGS: Scan quality: Good. LUMBAR SPINE (L1-L4): BMD (in g/cm2): 1.273 T-score: 0.6 Z-score: 2.3 Rate of change from previous exam: -5.7 % LEFT FEMORAL NECK: BMD (in g/cm2): 0.822 T-score: -1.6 Z-score: 0.2 LEFT TOTAL HIP: BMD (in g/cm2): 0.904 T-score: -0.8 Z-score: 0.7 RIGHT FEMORAL NECK: BMD (in g/cm2): 0.810 T-score: -1.6 Z-score: 0.1 RIGHT TOTAL HIP: BMD (in g/cm2): 0.928 T-score: -0.6 Z-score: 0.9 DUAL-FEMUR TOTAL MEAN: Rate of change from previous exam: -6.5 %  FRAX 10-YEAR PROBABILITY OF FRACTURE: 10-year fracture risk is performed using the [employer] FRAX calculator based on patient-reported risk factors. Major osteoporotic fracture: 10.4% Hip fracture: 1.7% Other situations known to alter the reliability of the FRAX score should be considered when making treatment decisions, including chronic glucocorticoid use and past treatments. Further guidance on treatment can be found at the [facility] Foundation's website [email or web address]/. IMPRESSION: Osteopenia based on BMD. Fracture risk is unknown due to history of bone building therapy. RECOMMENDATIONS: 1. All patients should optimize calcium  and vitamin D  intake. 2. Consider FDA-approved medical therapies in postmenopausal women and men aged 25 years and older, based on the following: - A hip or vertebral (clinical or morphometric) fracture - T-score less than or equal to -2.5 and secondary causes have been excluded. - Low bone mass (T-score between -1.0 and -2.5) and a 10-year probability of a hip fracture greater than or equal to 3% or a 10-year probability of a major osteoporosis-related fracture greater than or equal to 20% based on the [place] -adapted WHO algorithm. - Clinician judgment and/or patient preferences may indicate treatment for people with 10-year fracture probabilities above or below these levels 3. Patients with diagnosis of osteoporosis or at high risk for fracture should have regular bone mineral density tests. For patients eligible for Medicare, routine testing is allowed once every 2 years. The testing frequency can be increased to one year for patients who have rapidly progressing disease, those who are receiving or discontinuing medical therapy to restore bone mass, or have additional risk factors. Electronically Signed   By: Alm Parkins M.D.   On: 07/06/2024 07:34    ASSESSMENT & PLAN:   Carcinoma of upper-inner quadrant of left breast in female, estrogen receptor positive  (HCC) #Left breast cancer stage I ER/PR positive HER-2 negative; s/p lumpectomy; on adjuvant letrozole  until 2026-fall ] mammogram OCT 2025- -Dr. Cesar. Stable.   # On letrozole  tolerating  fairly well.  Again reviewed that she will continue until fall 2026.  # Osteopenia-DEC 2025- Osteopenia based on BMD  Given the stability of the bone density recommend continued monitoring [PCP]. Patient physically active/exercising.  continue  MVT- Ca 1200 mg+ 1000+  vit D 2000 mg/day.   MARCH 2024- vit D 61.  mychart # DISPOSITION: # Follow-up in 6 months-MD; cbc/cmp; vit D 25-OH levels- Dr. WENDI Cindy Johnson Rennie, MD 07/12/2024 2:30 PM

## 2024-07-15 ENCOUNTER — Other Ambulatory Visit

## 2024-07-15 DIAGNOSIS — E7800 Pure hypercholesterolemia, unspecified: Secondary | ICD-10-CM | POA: Diagnosis not present

## 2024-07-15 LAB — LIPID PANEL
Cholesterol: 161 mg/dL (ref 28–200)
HDL: 56.5 mg/dL (ref >39.00)
LDL Cholesterol: 85 mg/dL (ref 10–99)
NonHDL: 104.49
Total CHOL/HDL Ratio: 3
Triglycerides: 98 mg/dL (ref 10.0–149.0)
VLDL: 19.6 mg/dL (ref 0.0–40.0)

## 2024-07-26 ENCOUNTER — Other Ambulatory Visit

## 2024-09-20 ENCOUNTER — Encounter: Payer: BC Managed Care – PPO | Admitting: Dermatology

## 2025-01-11 ENCOUNTER — Inpatient Hospital Stay

## 2025-01-11 ENCOUNTER — Inpatient Hospital Stay: Admitting: Internal Medicine

## 2025-04-21 ENCOUNTER — Other Ambulatory Visit

## 2025-04-28 ENCOUNTER — Encounter: Admitting: Family Medicine
